# Patient Record
Sex: Female | Born: 1960
Health system: Southern US, Community
[De-identification: ages and names within clinical notes are randomized; demographics above are authoritative.]

## PROBLEM LIST (undated history)

## (undated) DIAGNOSIS — R002 Palpitations: Secondary | ICD-10-CM

## (undated) DIAGNOSIS — E669 Obesity, unspecified: Secondary | ICD-10-CM

## (undated) DIAGNOSIS — C539 Malignant neoplasm of cervix uteri, unspecified: Secondary | ICD-10-CM

## (undated) DIAGNOSIS — E079 Disorder of thyroid, unspecified: Secondary | ICD-10-CM

## (undated) DIAGNOSIS — F419 Anxiety disorder, unspecified: Secondary | ICD-10-CM

## (undated) DIAGNOSIS — Z923 Personal history of irradiation: Secondary | ICD-10-CM

## (undated) HISTORY — DX: Malignant neoplasm of cervix uteri, unspecified: C53.9

## (undated) HISTORY — DX: Obesity, unspecified: E66.9

## (undated) HISTORY — DX: Personal history of irradiation: Z92.3

## (undated) HISTORY — PX: THERAPEUTIC ABORTION: SHX798

---

## 2020-02-16 DIAGNOSIS — F4322 Adjustment disorder with anxiety: Secondary | ICD-10-CM | POA: Diagnosis not present

## 2020-02-16 DIAGNOSIS — Z6836 Body mass index (BMI) 36.0-36.9, adult: Secondary | ICD-10-CM | POA: Diagnosis not present

## 2020-02-16 DIAGNOSIS — N939 Abnormal uterine and vaginal bleeding, unspecified: Secondary | ICD-10-CM | POA: Diagnosis not present

## 2020-02-16 DIAGNOSIS — R319 Hematuria, unspecified: Secondary | ICD-10-CM | POA: Diagnosis not present

## 2020-03-08 DIAGNOSIS — Z1331 Encounter for screening for depression: Secondary | ICD-10-CM | POA: Diagnosis not present

## 2020-03-08 DIAGNOSIS — Z131 Encounter for screening for diabetes mellitus: Secondary | ICD-10-CM | POA: Diagnosis not present

## 2020-03-08 DIAGNOSIS — Z6836 Body mass index (BMI) 36.0-36.9, adult: Secondary | ICD-10-CM | POA: Diagnosis not present

## 2020-03-08 DIAGNOSIS — Z0000 Encounter for general adult medical examination without abnormal findings: Secondary | ICD-10-CM | POA: Diagnosis not present

## 2020-03-08 DIAGNOSIS — Z13220 Encounter for screening for lipoid disorders: Secondary | ICD-10-CM | POA: Diagnosis not present

## 2020-03-08 DIAGNOSIS — N950 Postmenopausal bleeding: Secondary | ICD-10-CM | POA: Diagnosis not present

## 2020-03-08 DIAGNOSIS — Z01419 Encounter for gynecological examination (general) (routine) without abnormal findings: Secondary | ICD-10-CM | POA: Diagnosis not present

## 2020-04-15 DIAGNOSIS — Z6835 Body mass index (BMI) 35.0-35.9, adult: Secondary | ICD-10-CM | POA: Diagnosis not present

## 2020-04-15 DIAGNOSIS — N950 Postmenopausal bleeding: Secondary | ICD-10-CM | POA: Diagnosis not present

## 2020-05-03 DIAGNOSIS — D061 Carcinoma in situ of exocervix: Secondary | ICD-10-CM | POA: Diagnosis not present

## 2020-05-03 DIAGNOSIS — R87613 High grade squamous intraepithelial lesion on cytologic smear of cervix (HGSIL): Secondary | ICD-10-CM | POA: Diagnosis not present

## 2020-05-03 DIAGNOSIS — D069 Carcinoma in situ of cervix, unspecified: Secondary | ICD-10-CM | POA: Diagnosis not present

## 2020-05-03 DIAGNOSIS — N950 Postmenopausal bleeding: Secondary | ICD-10-CM | POA: Diagnosis not present

## 2020-05-03 DIAGNOSIS — Z6835 Body mass index (BMI) 35.0-35.9, adult: Secondary | ICD-10-CM | POA: Diagnosis not present

## 2020-05-03 DIAGNOSIS — C539 Malignant neoplasm of cervix uteri, unspecified: Secondary | ICD-10-CM | POA: Diagnosis not present

## 2020-05-03 DIAGNOSIS — N871 Moderate cervical dysplasia: Secondary | ICD-10-CM | POA: Diagnosis not present

## 2020-05-11 ENCOUNTER — Encounter: Payer: Self-pay | Admitting: *Deleted

## 2020-05-11 ENCOUNTER — Telehealth: Payer: Self-pay | Admitting: *Deleted

## 2020-05-11 NOTE — Telephone Encounter (Signed)
Called the patient and scheduled an appt for 6/4 at 10:15am. Gave the address and phone number for the clinic. Also gave the policy for mask, parking and visitors

## 2020-05-12 NOTE — Progress Notes (Signed)
GYNECOLOGIC ONCOLOGY NEW PATIENT CONSULTATION   Patient Name: Kelly Hanson  Patient Age: 59 y.o. Date of Service: 05/14/20 Referring Provider: No referring provider defined for this encounter.   Primary Care Provider: [name], MD Consulting Provider: Jeral Pinch, MD   Assessment/Plan:  Clinical Stage IIB SCC of the cervix.  Discussed biopsy results and my exam findings today as well as treatment options in cervical cancer including surgery and radiation. Due to suspected parametrial involvement, the patient has at least Stage IIB disease. Given these findings on exam, she is not a candidate for primary surgical treatment. I reviewed the plan for primary radiation with sensitizing cisplatin. I ordered both a PET and CT today for radiologic staging.   We discussed the role that HPV plays in the pathogenesis of cervical cancer. I commended the patient on cessation of tobacco use.  The patient met with our nurse navigator, Santiago Glad, and will be scheduled to see Dr. Sondra Come and Dr. Alvy Bimler. We will present her case at tumor board for pathology (no LVSI details available on outside report) and radiology review.  All of the patient's questions were answered today.  A copy of this note was sent to the patient's referring provider.   55 minutes of total time was spent for this patient encounter, including preparation, face-to-face counseling with the patient and coordination of care, and documentation of the encounter.  Jeral Pinch, MD  Division of Gynecologic Oncology  Department of Obstetrics and Gynecology  [facility]  ___________________________________________  Chief Complaint: Chief Complaint  Patient presents with  . Cervical Cancer    New patient    History of Present Illness:  Kelly Hanson is a 59 y.o. y.o. female who is seen in consultation at the request of No ref. provider found for an evaluation of newly diagnosed cervical  cancer.  Between 6-8 months ago, the patient endorses having irregular spotting intermittnetly every few weeks that would last for a few days. This did not change over time and she describes it as light spotting. This was the first bleeding she had after menopause. She ultimately saw her PCP who performed a pap in March which returned showing HSIL. The patient tells me today that this is the first pap test she has had ever. She was then referred to gyn and underwent EMB/LEEP and transvaginal ultrasound for work-up of her PMB and high grade cervical dysplasia. Unfortunately, her pathology revealed SCC of the cervix.   Since her procedure on 5/24, she has had some bleeding and cramping, which is improving daily. She denies any vaginal discharge or odor. Appetite has been good, denies any nausea or emesis. She endorses normal bowel and bladder function. She has occasional pelvic pain when sitting.  She endorses a long history of back pain, no change recently. She denies any shortness of breath or chest pain at rest or with ambulation. Her history is notable for approximately 40 years of tobacco use - quit 1.5 years ago. She and her husband live in [place].  PAST MEDICAL HISTORY:  Past Medical History:  Diagnosis Date  . Cervical cancer ([place])   . Obesity (BMI 30-39.9)      PAST SURGICAL HISTORY:  History reviewed. No pertinent surgical history.  OB/GYN HISTORY:  OB History  Gravida Para Term Preterm AB Living  1 0          SAB TAB Ectopic Multiple Live Births               #  Outcome Date GA Lbr Len/2nd Weight Sex Delivery Anes PTL Lv  1 Gravida             No LMP recorded.  Age at menarche: 67 Age at menopause: 41 Hx of HRT: denies Hx of STDs: denies Last pap: 02/2020 - HSIL; first pap ever History of abnormal pap smears: yes  SCREENING STUDIES:  Last mammogram: has never had  Last colonoscopy: has never had  MEDICATIONS: Outpatient Encounter Medications as of 05/14/2020   Medication Sig  . Acetaminophen (TYLENOL PO) Take by mouth. PRN  . escitalopram (LEXAPRO) 5 MG tablet Take 5 mg by mouth at bedtime.  . IBUPROFEN PO Take by mouth. PRN  . [DISCONTINUED] escitalopram (LEXAPRO) 10 MG tablet Take 10 mg by mouth at bedtime.   No facility-administered encounter medications on file as of 05/14/2020.    ALLERGIES:  No Known Allergies   FAMILY HISTORY:  Family History  Problem Relation Age of Onset  . Stroke Maternal Grandmother   . Colon cancer Neg Hx   . Breast cancer Neg Hx   . Endometrial cancer Neg Hx   . Ovarian cancer Neg Hx      SOCIAL HISTORY:    Social Connections:   . Frequency of Communication with Friends and Family:   . Frequency of Social Gatherings with Friends and Family:   . Attends Religious Services:   . Active Member of Clubs or Organizations:   . Attends Archivist Meetings:   Marland Kitchen Marital Status:     REVIEW OF SYSTEMS:  + urinary frequency, incontinence, hot flashes, back pain, pelvic pain, vaginal bleeding and discharge, anxiety, depression, easy bruising. Denies appetite changes, fevers, chills, fatigue, unexplained weight changes. Denies hearing loss, neck lumps or masses, mouth sores, ringing in ears or voice changes. Denies cough or wheezing.  Denies shortness of breath. Denies chest pain or palpitations. Denies leg swelling. Denies abdominal distention, pain, blood in stools, constipation, diarrhea, nausea, vomiting, or early satiety. Denies pain with intercourse, dysuria, hematuria. Denies hot flashes, pelvic pain.   Denies joint pain or muscle pain/cramps. Denies itching, rash, or wounds. Denies dizziness, headaches, numbness or seizures. Denies swollen lymph nodes or glands. Denies confusion, or decreased concentration.  Physical Exam:  Vital Signs for this encounter:  Blood pressure (!) 161/77, pulse 81, temperature 98.9 F (37.2 C), temperature source Oral, resp. rate 18, height 5' 7"  (1.702 m),  weight 225 lb 12.8 oz (102.4 kg), SpO2 99 %. Body mass index is 35.37 kg/m. General: Alert, oriented, no acute distress.  HEENT: Normocephalic, atraumatic. Sclera anicteric.  Chest: Clear to auscultation bilaterally. No wheezes, rhonchi, or rales. Cardiovascular: Regular rate and rhythm, no murmurs, rubs, or gallops.  Abdomen: Obese. Normoactive bowel sounds. Soft, nondistended, nontender to palpation. No masses or hepatosplenomegaly appreciated. No palpable fluid wave.  Back: No CVA tenderness. Extremities: Grossly normal range of motion. Warm, well perfused. No edema bilaterally.  Skin: No rashes or lesions.  Lymphatics: No cervical, supraclavicular, or inguinal adenopathy.  GU:  Normal external female genitalia.   Minimal blood within the vaginal vault.  Well supported bladder.  Cervix is notable for evidence of recent LEEP procedure.  Eschar is still noted on the tissue itself.  The LEEP bed is friable and bleeds easily.  There is discoloration noted almost a centimeter around the LEEP diameter.  I do not see any evidence of vaginal involvement.  Some papular appearing tissue is noted within the LEEP bed.  On bimanual exam, the cervix  is firm, nodular and barrel-shaped, the tumor feels to be replacing most of the cervix.  On rectovaginal exam, there is thickening of the parametrial tissue bilaterally just adjacent to and behind the cervix.  This tissue is not nodular but is fairly thick.  The uterus itself is small and mobile, no adnexal masses appreciated.  LABORATORY AND RADIOLOGIC DATA:  Outside medical records were reviewed to synthesize the above history, along with the history and physical obtained during the visit.   Lab Results  Component Value Date   GLUCOSE 97 05/14/2020   ALT 31 05/14/2020   AST 20 05/14/2020   NA 140 05/14/2020   K 3.8 05/14/2020   CL 106 05/14/2020   CREATININE 0.87 05/14/2020   BUN 15 05/14/2020   CO2 23 05/14/2020   Pap on 03/09/19: HSIL  5/24: ECC  - CIN3 EMB - invasive SCC and CIN3 mied with scant benign endometrial tissue LEEP - invasive SCC, DOI >76m, width at least 1cm, deep and lateral margins are all positive.   Pelvic ultrasound on 5/24: Uterus 6x3x2.3cm, normal appearing adnexa. Endometrial lining is 0.942m

## 2020-05-14 ENCOUNTER — Other Ambulatory Visit: Payer: Self-pay

## 2020-05-14 ENCOUNTER — Telehealth: Payer: Self-pay | Admitting: Oncology

## 2020-05-14 ENCOUNTER — Encounter: Payer: Self-pay | Admitting: Oncology

## 2020-05-14 ENCOUNTER — Inpatient Hospital Stay (HOSPITAL_BASED_OUTPATIENT_CLINIC_OR_DEPARTMENT_OTHER): Payer: BC Managed Care – PPO | Admitting: Gynecologic Oncology

## 2020-05-14 ENCOUNTER — Other Ambulatory Visit: Payer: Self-pay | Admitting: Lab

## 2020-05-14 ENCOUNTER — Inpatient Hospital Stay: Payer: BC Managed Care – PPO | Attending: Gynecologic Oncology

## 2020-05-14 ENCOUNTER — Encounter: Payer: Self-pay | Admitting: Gynecologic Oncology

## 2020-05-14 VITALS — BP 161/77 | HR 81 | Temp 98.9°F | Resp 18 | Ht 67.0 in | Wt 225.8 lb

## 2020-05-14 DIAGNOSIS — Z87891 Personal history of nicotine dependence: Secondary | ICD-10-CM | POA: Insufficient documentation

## 2020-05-14 DIAGNOSIS — Z7982 Long term (current) use of aspirin: Secondary | ICD-10-CM | POA: Insufficient documentation

## 2020-05-14 DIAGNOSIS — E669 Obesity, unspecified: Secondary | ICD-10-CM | POA: Diagnosis not present

## 2020-05-14 DIAGNOSIS — C539 Malignant neoplasm of cervix uteri, unspecified: Secondary | ICD-10-CM

## 2020-05-14 DIAGNOSIS — Z6830 Body mass index (BMI) 30.0-30.9, adult: Secondary | ICD-10-CM | POA: Insufficient documentation

## 2020-05-14 DIAGNOSIS — Z79899 Other long term (current) drug therapy: Secondary | ICD-10-CM | POA: Insufficient documentation

## 2020-05-14 DIAGNOSIS — Z5111 Encounter for antineoplastic chemotherapy: Secondary | ICD-10-CM | POA: Insufficient documentation

## 2020-05-14 LAB — CMP (CANCER CENTER ONLY)
ALT: 31 U/L (ref 0–44)
AST: 20 U/L (ref 15–41)
Albumin: 4.2 g/dL (ref 3.5–5.0)
Alkaline Phosphatase: 73 U/L (ref 38–126)
Anion gap: 11 (ref 5–15)
BUN: 15 mg/dL (ref 6–20)
CO2: 23 mmol/L (ref 22–32)
Calcium: 9.4 mg/dL (ref 8.9–10.3)
Chloride: 106 mmol/L (ref 98–111)
Creatinine: 0.87 mg/dL (ref 0.44–1.00)
GFR, Est AFR Am: >60 mL/min (ref >60)
GFR, Estimated: >60 mL/min (ref >60)
Glucose, Bld: 97 mg/dL (ref 70–99)
Potassium: 3.8 mmol/L (ref 3.5–5.1)
Sodium: 140 mmol/L (ref 135–145)
Total Bilirubin: 0.7 mg/dL (ref 0.3–1.2)
Total Protein: 7.4 g/dL (ref 6.5–8.1)

## 2020-05-14 NOTE — Progress Notes (Signed)
Requested slides from 05/03/20 biopsy from [facility].  Faxed request to [phone].

## 2020-05-14 NOTE — Progress Notes (Signed)
Met with Kelly Hanson and her husband and explained my role as Art therapist.  Discussed the plan of care including chemotherapy and radiation.  Gave her the [employer] and encouraged her to call with any questions or needs.

## 2020-05-14 NOTE — Telephone Encounter (Signed)
Mal Amabile with appointment to see Dr. Alvy Bimler on 05/21/20 at 2 pm.  She verbalized understanding and agreement.

## 2020-05-14 NOTE — Patient Instructions (Signed)
It was a pleasure meeting you today.  I will call you after our tumor conference a week from Monday.  In the meantime, we will work on getting the scans done.  I am also asking our nurse navigator to work on getting you scheduled to see our radiation doctor and medical oncologist.

## 2020-05-15 ENCOUNTER — Encounter: Payer: Self-pay | Admitting: Gynecologic Oncology

## 2020-05-17 ENCOUNTER — Telehealth: Payer: Self-pay | Admitting: Oncology

## 2020-05-17 NOTE — Telephone Encounter (Signed)
[facility] and advised her of appointments for a nurse eval at 9:30 and consult with Dr. Sondra Come at 10:00 on 05/19/20. Advised her that she should receive a call to schedule CT SIM on 05/20/20.  She verbalized agreement. Also sent her an email with all her upcoming appointments for her records.

## 2020-05-18 ENCOUNTER — Encounter: Payer: Self-pay | Admitting: Oncology

## 2020-05-18 ENCOUNTER — Other Ambulatory Visit: Payer: Self-pay

## 2020-05-18 ENCOUNTER — Encounter (HOSPITAL_COMMUNITY): Payer: Self-pay

## 2020-05-18 ENCOUNTER — Ambulatory Visit (HOSPITAL_COMMUNITY)
Admission: RE | Admit: 2020-05-18 | Discharge: 2020-05-18 | Disposition: A | Discharge status: Home / Self Care | Payer: BC Managed Care – PPO | Source: Ambulatory Visit | Attending: Gynecologic Oncology | Admitting: Gynecologic Oncology

## 2020-05-18 DIAGNOSIS — R109 Unspecified abdominal pain: Secondary | ICD-10-CM | POA: Diagnosis not present

## 2020-05-18 DIAGNOSIS — C539 Malignant neoplasm of cervix uteri, unspecified: Secondary | ICD-10-CM | POA: Insufficient documentation

## 2020-05-18 DIAGNOSIS — R197 Diarrhea, unspecified: Secondary | ICD-10-CM | POA: Diagnosis not present

## 2020-05-18 MED ORDER — SODIUM CHLORIDE (PF) 0.9 % IJ SOLN
INTRAMUSCULAR | Status: AC
  Filled 2020-05-18: qty 50

## 2020-05-18 MED ORDER — IOHEXOL 300 MG/ML  SOLN
100.0000 mL | Freq: Once | INTRAMUSCULAR | Status: AC | PRN
  Administered 2020-05-18: 100 mL via INTRAVENOUS

## 2020-05-18 NOTE — Progress Notes (Signed)
Lorriane dropped off FMLA forms for herself and her husband and critical illness forms.  Forward them to [employer], Therapist, sports.

## 2020-05-18 NOTE — Progress Notes (Signed)
Patient here for a consult with Dr. Sondra Come.  GYNECOLOGIC ONCOLOGY NEW PATIENT CONSULTATION   Patient Name: Kelly Hanson  Patient Age: 59 y.o. Date of Service: 05/14/20 Referring Provider: No referring provider defined for this encounter.   Primary Care Provider: [name], MD Consulting Provider: Jeral Pinch, MD   Assessment/Plan:  Clinical Stage IIB SCC of the cervix.  Discussed biopsy results and my exam findings today as well as treatment options in cervical cancer including surgery and radiation. Due to suspected parametrial involvement, the patient has at least Stage IIB disease. Given these findings on exam, she is not a candidate for primary surgical treatment. I reviewed the plan for primary radiation with sensitizing cisplatin. I ordered both a PET and CT today for radiologic staging.   We discussed the role that HPV plays in the pathogenesis of cervical cancer. I commended the patient on cessation of tobacco use.  The patient met with our nurse navigator, Santiago Glad, and will be scheduled to see Dr. Sondra Come and Dr. Alvy Bimler. We will present her case at tumor board for pathology (no LVSI details available on outside report) and radiology review.  All of the patient's questions were answered today.  A copy of this note was sent to the patient's referring provider.   Chief Complaint:     Chief Complaint  Patient presents with  . Cervical Cancer    New patient    History of Present Illness:  Kelly Hanson is a 59 y.o. y.o. female who is seen in consultation at the request of No ref. provider found for an evaluation of newly diagnosed cervical cancer.  Between 6-8 months ago, the patient endorses having irregular spotting intermittnetly every few weeks that would last for a few days. This did not change over time and she describes it as light spotting. This was the first bleeding she had after menopause. She ultimately saw her PCP who performed a pap in March which  returned showing HSIL. The patient tells me today that this is the first pap test she has had ever. She was then referred to gyn and underwent EMB/LEEP and transvaginal ultrasound for work-up of her PMB and high grade cervical dysplasia. Unfortunately, her pathology revealed SCC of the cervix.   Since her procedure on 5/24, she has had some bleeding and cramping, which is improving daily. She denies any vaginal discharge or odor. Appetite has been good, denies any nausea or emesis. She endorses normal bowel and bladder function. She has occasional pelvic pain when sitting.  She endorses a long history of back pain, no change recently. She denies any shortness of breath or chest pain at rest or with ambulation. Her history is notable for approximately 40 years of tobacco use - quit 1.5 years ago. She and her husband live in [place].    Past/Anticipated interventions by medical oncology, if any: possibly chemo 1x per week  Weight changes, if any: a few pounds from dieting  Bowel/Bladder complaints, if any: has loose stools ? From Lexapro  Nausea/Vomiting, if any: No  Pain issues, if any:  Pelvic discomfort  SAFETY ISSUES:  Prior radiation? no  Pacemaker/ICD? no  Possible current pregnancy? Postmenopausal  Is the patient on methotrexate? no  Current Complaints / other details: has light bleeding currently when wiping  BP (!) 157/90 (BP Location: Left Arm, Patient Position: Sitting, Cuff Size: Large)   Pulse 80   Temp 97.9 F (36.6 C)   Resp 20   Ht 5' 7"  (1.702 m)  Wt 221 lb 6.4 oz (100.4 kg)   SpO2 100%   BMI 34.68 kg/m   Wt Readings from Last 3 Encounters:  05/19/20 221 lb 6.4 oz (100.4 kg)  05/14/20 225 lb 12.8 oz (102.4 kg)

## 2020-05-18 NOTE — Progress Notes (Signed)
Radiation Oncology         [phone] ________________________________  Initial Outpatient Consultation  Name: Kelly Hanson MRN: [id number]  Date: 05/19/2020  DOB: Feb 23, 1961  GD:[id number], Anderson Malta, MD  [name], MD   REFERRING PHYSICIAN: [name], MD  DIAGNOSIS: The encounter diagnosis was Malignant neoplasm of cervix, unspecified site [facility]).   Clinical stage IIB squamous cell carcinoma of the cervix  HISTORY OF PRESENT ILLNESS::Kelly Hanson is a 59 y.o. female who is seen as a courtesy of Dr Berline Lopes for an opinion concerning radiation therapy as part of management of the patient's recently diagnosed locally advanced squamous cell carcinoma of the cervix. The patient presented to her PCP in March of 2021 for evaluation of 6-8 month history of intermittent postmenopausal vaginal bleeding. A PAP smear was performed at that time, which showed a high-grade squamous intraepithelial lesion. She was then referred to a gynecologist and underwent an EMB/LEEP on 05/03/2020 and a transvaginal ultrasound for work-up of her postmenopausal bleeding and high-grade cervical dysplasia. Pathology revealed squamous cell carcinoma of the cervix.  The patient was referred to Dr. Berline Lopes and was seen in consultation on 05/14/2020. At that time, they discussed treatment options including surgery and radiation therapy. Given that the tumor was noted to replace most of the cervix and there was thickening of the parametrial tissue bilaterally just adjacent to and behind the cervix, the patient was noted to not be a candidate for primary surgical treatment. Thus, they discussed the plan for PET/CT scan followed by primary radiation with sensitizing Cisplatin.  CT scan of abdomen/pelvis on 05/18/2020 showed a small amount of gas either along the cervical canal or the adjacent fornix. A well-defined mass was not seen. There was also noted to be chronic bilateral pars defects at L5 with 7 mm grade 1  anterolisthesis of L5 on S1 and resulting mild left foraminal stenosis. Additionally, there was a suspected right foraminal disc protrusion at L3-4 that was possibly causing mild right foraminal impingement. Fortunately, there were no findings of metastatic disease to the abdomen or pelvis.  PREVIOUS RADIATION THERAPY: No  PAST MEDICAL HISTORY:  Past Medical History:  Diagnosis Date  . Cervical cancer ([place])   . Obesity (BMI 30-39.9)     PAST SURGICAL HISTORY:History reviewed. No pertinent surgical history.  FAMILY HISTORY:  Family History  Problem Relation Age of Onset  . Stroke Maternal Grandmother   . Colon cancer Neg Hx   . Breast cancer Neg Hx   . Endometrial cancer Neg Hx   . Ovarian cancer Neg Hx     SOCIAL HISTORY: She lives in [place] a farm.  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 Smoking status: Former Research scientist (life sciences)  . Smokeless tobacco: Never Used  Substance Use Topics  . Alcohol use: Not Currently  . Drug use: Never    ALLERGIES: No Known Allergies  MEDICATIONS:  Current Outpatient Medications  Medication Sig Dispense Refill  . Acetaminophen (TYLENOL PO) Take by mouth. PRN    . aspirin EC 81 MG tablet Take 81 mg by mouth every 4 (four) hours as needed.    Marland Kitchen escitalopram (LEXAPRO) 5 MG tablet Take 5 mg by mouth at bedtime.    . IBUPROFEN PO Take by mouth. PRN     No current facility-administered medications for this encounter.    REVIEW OF SYSTEMS:  A 10+ POINT REVIEW OF SYSTEMS WAS OBTAINED including neurology, dermatology, psychiatry, cardiac, respiratory, lymph, extremities, GI,  GU, musculoskeletal, constitutional, reproductive, HEENT.  Patient reports chronic low back pain.  She also has noticed some mild pelvic pain.  She denies any hematuria or rectal bleeding.  Appetite is good.  Energy level is good   PHYSICAL EXAM:  height is 5\' 7"  (1.702 m) and weight is 221 lb 6.4 oz (100.4 kg). Her temperature is 97.9 F (36.6 C). Her  blood pressure is 157/90 (abnormal) and her pulse is 80. Her respiration is 20 and oxygen saturation is 100%.   Body mass index is 34.68 kg/m. General: Alert and oriented, in no acute distress HEENT: Head is normocephalic. Extraocular movements are intact. Neck: Neck is supple, no palpable cervical or supraclavicular lymphadenopathy. Heart: Regular in rate and rhythm with no murmurs, rubs, or gallops. Chest: Clear to auscultation bilaterally, with no rhonchi, wheezes, or rales. Abdomen: Soft, nontender, nondistended, with no rigidity or guarding. Extremities: No cyanosis or edema. Lymphatics: see Neck Exam Skin: No concerning lesions. Musculoskeletal: symmetric strength and muscle tone throughout. Neurologic: Cranial nerves II through XII are grossly intact. No obvious focalities. Speech is fluent. Coordination is intact. Psychiatric: Judgment and insight are intact. Affect is appropriate. On pelvic examination the external genitalia were unremarkable. A speculum exam was performed. There are no mucosal lesions noted in the vaginal vault.  View of the cervix shows surgical changes consistent with LEEP procedure.  This area bleeds easily with manipulation.  On bimanual and rectovaginal examination the cervix is expanded and firm consistent with tumor infiltration, somewhat of a barrel-shaped.  Difficult to determine parametrial involvement on exam today.   ECOG =  1 0 - Asymptomatic (Fully active, able to carry on all predisease activities without restriction)  1 - Symptomatic but completely ambulatory (Restricted in physically strenuous activity but ambulatory and able to carry out work of a light or sedentary nature. For example, light housework, office work)  2 - Symptomatic, <50% in bed during the day (Ambulatory and capable of all self care but unable to carry out any work activities. Up and about more than 50% of waking hours)  3 - Symptomatic, >50% in bed, but not bedbound (Capable of  only limited self-care, confined to bed or chair 50% or more of waking hours)  4 - Bedbound (Completely disabled. Cannot carry on any self-care. Totally confined to bed or chair)  5 - Death   Eustace Pen MM, Creech RH, Tormey DC, et al. [phone]). "Toxicity and response criteria of the [facility]". [place] Oncol. 5 (6): 649-55  LABORATORY DATA:  No results found for: WBC, HGB, HCT, MCV, PLT, NEUTROABS Lab Results  Component Value Date   NA 140 05/14/2020   K 3.8 05/14/2020   CL 106 05/14/2020   CO2 23 05/14/2020   GLUCOSE 97 05/14/2020   CREATININE 0.87 05/14/2020   CALCIUM 9.4 05/14/2020      RADIOGRAPHY: CT Abdomen Pelvis W Contrast  Result Date: 05/18/2020 CLINICAL DATA:  Staging of cervical cancer. Lower abdominal pain and discomfort for 6 months with diarrhea and occasional vaginal bleeding. EXAM: CT ABDOMEN AND PELVIS WITH CONTRAST TECHNIQUE: Multidetector CT imaging of the abdomen and pelvis was performed using the standard protocol following bolus administration of intravenous contrast. CONTRAST:  130mL OMNIPAQUE IOHEXOL 300 MG/ML  SOLN COMPARISON:  None. FINDINGS: Lower chest: 0.5 by 0.4 cm right lower lobe pulmonary nodule on image 17/6. Hepatobiliary: Unremarkable Pancreas: Unremarkable Spleen: Unremarkable Adrenals/Urinary Tract: Mild scarring in the left kidney upper pole. Adrenal glands unremarkable. Urinary bladder appears  normal. Stomach/Bowel: Unremarkable Vascular/Lymphatic: No pathologic adenopathy. Minimal atherosclerotic calcification in the left common iliac artery. Reproductive: There is a small amount of gas either along the cervical canal or the adjacent fornix on image 71/5. A well-defined mass is not seen. No widening of the endometrial stripe. The ovaries and adnexa appear normal. Other: No supplemental non-categorized findings. Musculoskeletal: Chronic bilateral pars defects at L5 with 7 mm grade 1 anterolisthesis of L5 on S1 and resulting mild  left foraminal stenosis. Suspected right foraminal disc protrusion at L3-4 possibly causing mild right foraminal impingement. IMPRESSION: 1. No findings of metastatic disease to the abdomen/pelvis. 2. There is a small amount of gas either along the cervical canal or the adjacent fornix. A well-defined mass is not seen. 3. Chronic bilateral pars defects at L5 with 7 mm grade 1 anterolisthesis of L5 on S1 and resulting mild left foraminal stenosis. 4. Suspected right foraminal disc protrusion at L3-4 possibly causing mild right foraminal impingement. Electronically Signed   By: Van Clines M.D.   On: 05/18/2020 16:47      IMPRESSION: Clinical stage IIB squamous cell carcinoma of the cervix  The patient will be a good candidate for definitive course of radiation therapy along with radiosensitizing chemotherapy.  Radiation therapy would entail external beam radiation therapy directed at the pelvis area followed by bilateral parametrial boost.  In addition radiation therapy would include 5 high-dose-rate intracavitary brachytherapy treatments directed at the cervical area.  Treatment plans could change depending on the patient's upcoming PET scan June 16 but on recent CT scan of the abdomen and pelvis there was no obvious metastatic spread outside of the cervical area.  Today, I talked to the patient and  about the findings and work-up thus far.  We discussed the natural history of cervical cancer and general treatment, highlighting the role of radiotherapy (external beam and intracavitary high-dose-rate brachytherapy treatments) in the management.  We discussed the available radiation techniques, and focused on the details of logistics and delivery.  We reviewed the anticipated acute and late sequelae associated with radiation in this setting.  The patient was encouraged to ask questions that I answered to the best of my ability.  A patient consent form was discussed and signed.  We retained a copy for  our records.  The patient would like to proceed with radiation and will be scheduled for CT simulation.  PLAN: The patient is scheduled to see Dr. Alvy Bimler on 05/21/2020. She is also scheduled for a PET scan on 05/26/2020.  She will proceed with CT simulation later today.  She will be ready to begin radiation therapy on June 21 concomitant with radiosensitizing chemotherapy.  Anticipate 6 weeks of external beam radiation therapy followed by 5 intracavitary brachytherapy treatments.    ------------------------------------------------  Blair Promise, PhD, MD  This document serves as a record of services personally performed by Gery Pray, MD. It was created on his behalf by Clerance Lav, a trained medical scribe. The creation of this record is based on the scribe's personal observations and the provider's statements to them. This document has been checked and approved by the attending provider.

## 2020-05-19 ENCOUNTER — Ambulatory Visit
Admission: RE | Admit: 2020-05-19 | Discharge: 2020-05-19 | Disposition: A | Discharge status: Home / Self Care | Payer: BC Managed Care – PPO | Source: Ambulatory Visit | Attending: Radiation Oncology | Admitting: Radiation Oncology

## 2020-05-19 ENCOUNTER — Encounter: Payer: Self-pay | Admitting: Radiation Oncology

## 2020-05-19 ENCOUNTER — Other Ambulatory Visit: Payer: Self-pay

## 2020-05-19 DIAGNOSIS — Z9889 Other specified postprocedural states: Secondary | ICD-10-CM | POA: Diagnosis not present

## 2020-05-19 DIAGNOSIS — Z7982 Long term (current) use of aspirin: Secondary | ICD-10-CM | POA: Insufficient documentation

## 2020-05-19 DIAGNOSIS — C539 Malignant neoplasm of cervix uteri, unspecified: Secondary | ICD-10-CM | POA: Insufficient documentation

## 2020-05-19 DIAGNOSIS — Z79899 Other long term (current) drug therapy: Secondary | ICD-10-CM | POA: Insufficient documentation

## 2020-05-19 DIAGNOSIS — Z87891 Personal history of nicotine dependence: Secondary | ICD-10-CM | POA: Insufficient documentation

## 2020-05-19 DIAGNOSIS — Z510 Encounter for antineoplastic radiation therapy: Secondary | ICD-10-CM | POA: Diagnosis not present

## 2020-05-21 ENCOUNTER — Inpatient Hospital Stay: Payer: BC Managed Care – PPO | Admitting: Hematology and Oncology

## 2020-05-21 ENCOUNTER — Other Ambulatory Visit: Payer: Self-pay

## 2020-05-21 ENCOUNTER — Encounter: Payer: Self-pay | Admitting: Hematology and Oncology

## 2020-05-21 ENCOUNTER — Inpatient Hospital Stay: Payer: BC Managed Care – PPO

## 2020-05-21 VITALS — BP 150/89 | HR 81 | Temp 98.3°F | Resp 18 | Ht 67.0 in | Wt 218.6 lb

## 2020-05-21 DIAGNOSIS — Z87891 Personal history of nicotine dependence: Secondary | ICD-10-CM

## 2020-05-21 DIAGNOSIS — E669 Obesity, unspecified: Secondary | ICD-10-CM

## 2020-05-21 DIAGNOSIS — C539 Malignant neoplasm of cervix uteri, unspecified: Secondary | ICD-10-CM | POA: Diagnosis not present

## 2020-05-21 DIAGNOSIS — Z7982 Long term (current) use of aspirin: Secondary | ICD-10-CM

## 2020-05-21 DIAGNOSIS — Z6830 Body mass index (BMI) 30.0-30.9, adult: Secondary | ICD-10-CM

## 2020-05-21 DIAGNOSIS — Z5111 Encounter for antineoplastic chemotherapy: Secondary | ICD-10-CM | POA: Diagnosis not present

## 2020-05-21 DIAGNOSIS — Z79899 Other long term (current) drug therapy: Secondary | ICD-10-CM

## 2020-05-21 MED ORDER — PROCHLORPERAZINE MALEATE 10 MG PO TABS: 10.0000 mg | ORAL_TABLET | Freq: Four times a day (QID) | ORAL | 1 refills | Status: DC | PRN

## 2020-05-21 MED ORDER — LIDOCAINE-PRILOCAINE 2.5-2.5 % EX CREA: TOPICAL_CREAM | CUTANEOUS | 3 refills | Status: DC

## 2020-05-21 MED ORDER — ONDANSETRON HCL 8 MG PO TABS: 8.0000 mg | ORAL_TABLET | Freq: Three times a day (TID) | ORAL | 1 refills | Status: DC | PRN

## 2020-05-21 NOTE — Progress Notes (Signed)
[place] CONSULT NOTE  Patient Care Team: [name], MD as PCP - General (Family Medicine)  ASSESSMENT & PLAN:  Cervical cancer [facility]) The final staging depends on PET CT scan next week Assuming she has stage IIb disease, I recommend concurrent chemoradiation therapy with curative intent  We discussed the role of chemotherapy. The intent is of curative intent.  We discussed some of the risks, benefits, side-effects of cisplatin and its role as chemo sensitizing agent. The plan for weekly cisplatin for x5-6 doses along with radiation treatment.  Some of the short term side-effects included, though not limited to, including weight loss, life threatening infections, risk of allergic reactions, need for transfusions of blood products, nausea, vomiting, change in bowel habits, loss of hair, admission to hospital for various reasons, and risks of death.   Long term side-effects are also discussed including risks of infertility, permanent damage to nerve function, hearing loss, chronic fatigue, kidney damage with possibility needing hemodialysis, and rare secondary malignancy including bone marrow disorders.  The patient is aware that the response rates discussed earlier is not guaranteed.  After a long discussion, patient made an informed decision to proceed with the prescribed plan of care.   Patient education material was dispensed. I recommend port placement, chemo education class and repeat blood work next week before we start her on treatment I will see her weekly with labs and exam for toxicity review     Orders Placed This Encounter  Procedures  . IR IMAGING GUIDED PORT INSERTION    Standing Status:   Future    Standing Expiration Date:   05/21/2021    Order Specific Question:   Reason for Exam (SYMPTOM  OR DIAGNOSIS REQUIRED)    Answer:   need port for chemo to start ASAP    Order Specific Question:   Is the patient pregnant?    Answer:   No    Order  Specific Question:   Preferred Imaging Location?    Answer:   [facility]  . CBC with Differential ([place] Only)    Standing Status:   Standing    Number of Occurrences:   20    Standing Expiration Date:   05/21/2021  . Basic Metabolic Panel - [place] Only    Standing Status:   Standing    Number of Occurrences:   20    Standing Expiration Date:   05/21/2021    The total time spent in the appointment was 70 minutes encounter with patients including review of chart and various tests results, discussions about plan of care and coordination of care plan   All questions were answered. The patient knows to call the clinic with any problems, questions or concerns. No barriers to learning was detected.  Heath Lark, MD 6/11/20214:07 PM  CHIEF COMPLAINTS/PURPOSE OF CONSULTATION:  Cervical cancer, for concurrent chemoradiation therapy  HISTORY OF PRESENTING ILLNESS:  Kelly Hanson 59 y.o. female is here because of recent diagnosis of cervical cancer She is here accompanied by her husband Her symptoms started with intermittent abnormal vaginal bleeding She denies pain  I have reviewed her chart and materials related to her cancer extensively and collaborated history with the patient. Summary of oncologic history is as follows: Oncology History  Cervical cancer ([place])  02/09/2020 Initial Diagnosis   Between 6-8 months ago, the patient endorses having irregular spotting intermittnetly every few weeks that would last for a few days. This did not change over time and she  describes it as light spotting. This was the first bleeding she had after menopause. She ultimately saw her PCP who performed a pap in March which returned showing HSIL. The patient tells me today that this is the first pap test she has had ever. She was then referred to gyn and underwent EMB/LEEP and transvaginal ultrasound for work-up of her PMB and high grade cervical dysplasia. Unfortunately, her pathology revealed  SCC of the cervix   05/03/2020 Pathology Results   Endocervical curettings show fragments of squamous epithelium with high-grade squamous intraepithelial lesion, CIN-3 as well as fragments of invasive squamous cell carcinoma within the endometrial biopsy.  The invasive squamous cell carcinoma have depth of invasion more than 3 mm, with at least 10 mm, thickness almost 1.2 mm.   05/18/2020 Imaging   1. No findings of metastatic disease to the abdomen/pelvis. 2. There is a small amount of gas either along the cervical canal or the adjacent fornix. A well-defined mass is not seen. 3. Chronic bilateral pars defects at L5 with 7 mm grade 1 anterolisthesis of L5 on S1 and resulting mild left foraminal stenosis. 4. Suspected right foraminal disc protrusion at L3-4 possibly causing mild right foraminal impingement.     05/21/2020 Cancer Staging   Staging form: Cervix Uteri, AJCC Version 9 - Clinical stage from 05/21/2020: FIGO Stage IIB (cT2b, cM0) - Signed by Heath Lark, MD on 05/21/2020   05/31/2020 -  Chemotherapy   The patient had palonosetron (ALOXI) injection 0.25 mg, 0.25 mg, Intravenous,  Once, 0 of 6 cycles CISplatin (PLATINOL) 76 mg in sodium chloride 0.9 % 250 mL chemo infusion, 40 mg/m2 = 76 mg, Intravenous,  Once, 0 of 6 cycles fosaprepitant (EMEND) 150 mg in sodium chloride 0.9 % 145 mL IVPB, 150 mg, Intravenous,  Once, 0 of 6 cycles  for chemotherapy treatment.      MEDICAL HISTORY:  Past Medical History:  Diagnosis Date  . Cervical cancer ([place])   . Obesity (BMI 30-39.9)     SURGICAL HISTORY: History reviewed. No pertinent surgical history.  SOCIAL HISTORY: Social History   Socioeconomic History  . Marital status: Married    Spouse name: Dominica Severin  . Number of children: 0  . Years of education: Not on file  . Highest education level: Not on file  Occupational History  . Occupation: Optometrist  Tobacco Use  . Smoking status: Former Research scientist (life sciences)  . Smokeless tobacco: Never Used   Vaping Use  . Vaping Use: Never used  Substance and Sexual Activity  . Alcohol use: Not Currently  . Drug use: Never  . Sexual activity: Not on file  Other Topics Concern  . Not on file  Social History Narrative  . Not on file   Social Determinants of Health   Financial Resource Strain:   . Difficulty of Paying Living Expenses:   Food Insecurity:   . Worried About Charity fundraiser in the Last Year:   . Arboriculturist in the Last Year:   Transportation Needs:   . Film/video editor (Medical):   Marland Kitchen Lack of Transportation (Non-Medical):   Physical Activity:   . Days of Exercise per Week:   . Minutes of Exercise per Session:   Stress:   . Feeling of Stress :   Social Connections:   . Frequency of Communication with Friends and Family:   . Frequency of Social Gatherings with Friends and Family:   . Attends Religious Services:   . Active Member of  Clubs or Organizations:   . Attends Archivist Meetings:   Marland Kitchen Marital Status:   Intimate Partner Violence:   . Fear of Current or Ex-Partner:   . Emotionally Abused:   Marland Kitchen Physically Abused:   . Sexually Abused:     FAMILY HISTORY: Family History  Problem Relation Age of Onset  . Stroke Maternal Grandmother   . Colon cancer Neg Hx   . Breast cancer Neg Hx   . Endometrial cancer Neg Hx   . Ovarian cancer Neg Hx     ALLERGIES:  has No Known Allergies.  MEDICATIONS:  Current Outpatient Medications  Medication Sig Dispense Refill  . Acetaminophen (TYLENOL PO) Take by mouth. PRN    . aspirin EC 81 MG tablet Take 81 mg by mouth every 4 (four) hours as needed.    Marland Kitchen escitalopram (LEXAPRO) 5 MG tablet Take 5 mg by mouth at bedtime.    . lidocaine-prilocaine (EMLA) cream Apply to affected area once 30 g 3  . ondansetron (ZOFRAN) 8 MG tablet Take 1 tablet (8 mg total) by mouth every 8 (eight) hours as needed. 30 tablet 1  . prochlorperazine (COMPAZINE) 10 MG tablet Take 1 tablet (10 mg total) by mouth every 6 (six)  hours as needed (Nausea or vomiting). 30 tablet 1   No current facility-administered medications for this visit.    REVIEW OF SYSTEMS:   Constitutional: Denies fevers, chills or abnormal night sweats Eyes: Denies blurriness of vision, double vision or watery eyes Ears, nose, mouth, throat, and face: Denies mucositis or sore throat Respiratory: Denies cough, dyspnea or wheezes Cardiovascular: Denies palpitation, chest discomfort or lower extremity swelling Gastrointestinal:  Denies nausea, heartburn or change in bowel habits Skin: Denies abnormal skin rashes Lymphatics: Denies new lymphadenopathy or easy bruising Neurological:Denies numbness, tingling or new weaknesses Behavioral/Psych: Mood is stable, no new changes  All other systems were reviewed with the patient and are negative.  PHYSICAL EXAMINATION: ECOG PERFORMANCE STATUS: 0 - Asymptomatic  Vitals:   05/21/20 1354  BP: (!) 150/89  Pulse: 81  Resp: 18  Temp: 98.3 F (36.8 C)  SpO2: 95%   Filed Weights   05/21/20 1354  Weight: 218 lb 9.6 oz (99.2 kg)    GENERAL:alert, no distress and comfortable SKIN: skin color, texture, turgor are normal, no rashes or significant lesions EYES: normal, conjunctiva are pink and non-injected, sclera clear OROPHARYNX:no exudate, no erythema and lips, buccal mucosa, and tongue normal  NECK: supple, thyroid normal size, non-tender, without nodularity LYMPH:  no palpable lymphadenopathy in the cervical, axillary or inguinal LUNGS: clear to auscultation and percussion with normal breathing effort HEART: regular rate & rhythm and no murmurs and no lower extremity edema ABDOMEN:abdomen soft, non-tender and normal bowel sounds Musculoskeletal:no cyanosis of digits and no clubbing  PSYCH: alert & oriented x 3 with fluent speech NEURO: no focal motor/sensory deficits  LABORATORY DATA:  I have reviewed the data as listed No results found for: WBC, HGB, HCT, MCV, PLT Recent Labs     05/14/20 1211  NA 140  K 3.8  CL 106  CO2 23  GLUCOSE 97  BUN 15  CREATININE 0.87  CALCIUM 9.4  GFRNONAA >60  GFRAA >60  PROT 7.4  ALBUMIN 4.2  AST 20  ALT 31  ALKPHOS 73  BILITOT 0.7    RADIOGRAPHIC STUDIES: I have personally reviewed the radiological images as listed and agreed with the findings in the report. CT Abdomen Pelvis W Contrast  Result Date:  05/18/2020 CLINICAL DATA:  Staging of cervical cancer. Lower abdominal pain and discomfort for 6 months with diarrhea and occasional vaginal bleeding. EXAM: CT ABDOMEN AND PELVIS WITH CONTRAST TECHNIQUE: Multidetector CT imaging of the abdomen and pelvis was performed using the standard protocol following bolus administration of intravenous contrast. CONTRAST:  141mL OMNIPAQUE IOHEXOL 300 MG/ML  SOLN COMPARISON:  None. FINDINGS: Lower chest: 0.5 by 0.4 cm right lower lobe pulmonary nodule on image 17/6. Hepatobiliary: Unremarkable Pancreas: Unremarkable Spleen: Unremarkable Adrenals/Urinary Tract: Mild scarring in the left kidney upper pole. Adrenal glands unremarkable. Urinary bladder appears normal. Stomach/Bowel: Unremarkable Vascular/Lymphatic: No pathologic adenopathy. Minimal atherosclerotic calcification in the left common iliac artery. Reproductive: There is a small amount of gas either along the cervical canal or the adjacent fornix on image 71/5. A well-defined mass is not seen. No widening of the endometrial stripe. The ovaries and adnexa appear normal. Other: No supplemental non-categorized findings. Musculoskeletal: Chronic bilateral pars defects at L5 with 7 mm grade 1 anterolisthesis of L5 on S1 and resulting mild left foraminal stenosis. Suspected right foraminal disc protrusion at L3-4 possibly causing mild right foraminal impingement. IMPRESSION: 1. No findings of metastatic disease to the abdomen/pelvis. 2. There is a small amount of gas either along the cervical canal or the adjacent fornix. A well-defined mass is not seen.  3. Chronic bilateral pars defects at L5 with 7 mm grade 1 anterolisthesis of L5 on S1 and resulting mild left foraminal stenosis. 4. Suspected right foraminal disc protrusion at L3-4 possibly causing mild right foraminal impingement. Electronically Signed   By: Van Clines M.D.   On: 05/18/2020 16:47

## 2020-05-21 NOTE — Progress Notes (Signed)
START OFF PATHWAY REGIMEN - Other   [id number]:Cisplatin 40 mg/m2 IV D1 q7 Days + RT:   A cycle is every 7 days:     Cisplatin   **Always confirm dose/schedule in your pharmacy ordering system**  Patient Characteristics: Intent of Therapy: Curative Intent, Discussed with Patient 

## 2020-05-21 NOTE — Assessment & Plan Note (Signed)
The final staging depends on PET CT scan next week Assuming she has stage IIb disease, I recommend concurrent chemoradiation therapy with curative intent  We discussed the role of chemotherapy. The intent is of curative intent.  We discussed some of the risks, benefits, side-effects of cisplatin and its role as chemo sensitizing agent. The plan for weekly cisplatin for x5-6 doses along with radiation treatment.  Some of the short term side-effects included, though not limited to, including weight loss, life threatening infections, risk of allergic reactions, need for transfusions of blood products, nausea, vomiting, change in bowel habits, loss of hair, admission to hospital for various reasons, and risks of death.   Long term side-effects are also discussed including risks of infertility, permanent damage to nerve function, hearing loss, chronic fatigue, kidney damage with possibility needing hemodialysis, and rare secondary malignancy including bone marrow disorders.  The patient is aware that the response rates discussed earlier is not guaranteed.  After a long discussion, patient made an informed decision to proceed with the prescribed plan of care.   Patient education material was dispensed. I recommend port placement, chemo education class and repeat blood work next week before we start her on treatment I will see her weekly with labs and exam for toxicity review

## 2020-05-24 ENCOUNTER — Telehealth: Payer: Self-pay | Admitting: Gynecologic Oncology

## 2020-05-24 ENCOUNTER — Telehealth: Payer: Self-pay | Admitting: *Deleted

## 2020-05-24 NOTE — Telephone Encounter (Signed)
Per Dr Berline Lopes, scheduled an appt after her PET scan for 6/16

## 2020-05-24 NOTE — Telephone Encounter (Signed)
Called the patient to discuss plan to repeat an exam on 6/16 after she has her PET performed. Given CT results, I would like to make sure that what I felt as parametrial involvement was not just inflammation in the setting of very recent LEEP. Patient verbalized understanding.  Jeral Pinch MD Gynecologic Oncology

## 2020-05-25 NOTE — Progress Notes (Signed)
Pharmacist Chemotherapy Monitoring - Initial Assessment    Anticipated start date: 05/31/20   Regimen:  . Are orders appropriate based on the patient's diagnosis, regimen, and cycle? Yes . Does the plan date match the patient's scheduled date? Yes . Is the sequencing of drugs appropriate? Yes . Are the premedications appropriate for the patient's regimen? Yes . Prior Authorization for treatment is: Not Started o If applicable, is the correct biosimilar selected based on the patient's insurance? not applicable  Organ Function and Labs: Marland Kitchen Are dose adjustments needed based on the patient's renal function, hepatic function, or hematologic function? Yes . Are appropriate labs ordered prior to the start of patient's treatment? Yes . Other organ system assessment, if indicated: N/A . The following baseline labs, if indicated, have been ordered: N/A  Dose Assessment: . Are the drug doses appropriate? Yes . Are the following correct: o Drug concentrations Yes o IV fluid compatible with drug Yes o Administration routes Yes o Timing of therapy Yes . If applicable, does the patient have documented access for treatment and/or plans for port-a-cath placement? yes . If applicable, have lifetime cumulative doses been properly documented and assessed? yes Lifetime Dose Tracking  No doses have been documented on this patient for the following tracked chemicals: Doxorubicin, Epirubicin, Idarubicin, Daunorubicin, Mitoxantrone, Bleomycin, Oxaliplatin, Carboplatin, Liposomal Doxorubicin  o   Toxicity Monitoring/Prevention: . The patient has the following take home antiemetics prescribed: Ondansetron and Prochlorperazine . The patient has the following take home medications prescribed: N/A . Medication allergies and previous infusion related reactions, if applicable, have been reviewed and addressed. Yes . The patient's current medication list has been assessed for drug-drug interactions with their  chemotherapy regimen. no significant drug-drug interactions were identified on review.  Order Review: . Are the treatment plan orders signed? Yes . Is the patient scheduled to see a provider prior to their treatment? No  I verify that I have reviewed each item in the above checklist and answered each question accordingly.   Kennith Center, Pharm.D., CPP 05/25/2020@2 :52 PM

## 2020-05-26 ENCOUNTER — Other Ambulatory Visit: Payer: Self-pay

## 2020-05-26 ENCOUNTER — Inpatient Hospital Stay (HOSPITAL_BASED_OUTPATIENT_CLINIC_OR_DEPARTMENT_OTHER): Payer: BC Managed Care – PPO | Admitting: Gynecologic Oncology

## 2020-05-26 ENCOUNTER — Ambulatory Visit (HOSPITAL_COMMUNITY)
Admission: RE | Admit: 2020-05-26 | Discharge: 2020-05-26 | Disposition: A | Discharge status: Home / Self Care | Payer: BC Managed Care – PPO | Source: Ambulatory Visit | Attending: Gynecologic Oncology | Admitting: Gynecologic Oncology

## 2020-05-26 ENCOUNTER — Telehealth: Payer: Self-pay | Admitting: Oncology

## 2020-05-26 ENCOUNTER — Other Ambulatory Visit (HOSPITAL_COMMUNITY): Payer: BC Managed Care – PPO

## 2020-05-26 ENCOUNTER — Encounter: Payer: Self-pay | Admitting: Gynecologic Oncology

## 2020-05-26 VITALS — BP 149/88 | HR 64 | Temp 98.6°F | Resp 18 | Ht 67.0 in | Wt 219.4 lb

## 2020-05-26 DIAGNOSIS — Z6830 Body mass index (BMI) 30.0-30.9, adult: Secondary | ICD-10-CM

## 2020-05-26 DIAGNOSIS — E669 Obesity, unspecified: Secondary | ICD-10-CM

## 2020-05-26 DIAGNOSIS — Z7982 Long term (current) use of aspirin: Secondary | ICD-10-CM | POA: Diagnosis not present

## 2020-05-26 DIAGNOSIS — Z79899 Other long term (current) drug therapy: Secondary | ICD-10-CM | POA: Diagnosis not present

## 2020-05-26 DIAGNOSIS — Z87891 Personal history of nicotine dependence: Secondary | ICD-10-CM | POA: Diagnosis not present

## 2020-05-26 DIAGNOSIS — C539 Malignant neoplasm of cervix uteri, unspecified: Secondary | ICD-10-CM | POA: Insufficient documentation

## 2020-05-26 DIAGNOSIS — Z5111 Encounter for antineoplastic chemotherapy: Secondary | ICD-10-CM | POA: Diagnosis not present

## 2020-05-26 DIAGNOSIS — C55 Malignant neoplasm of uterus, part unspecified: Secondary | ICD-10-CM | POA: Diagnosis not present

## 2020-05-26 MED ORDER — FLUDEOXYGLUCOSE F - 18 (FDG) INJECTION
11.1100 | Freq: Once | INTRAVENOUS | Status: AC | PRN
  Administered 2020-05-26: 11.11 via INTRAVENOUS

## 2020-05-26 NOTE — Telephone Encounter (Signed)
[facility].  They did not see the slide custody agreement faxed on Friday, 05/21/20 until today.  They will send the slides today.

## 2020-05-26 NOTE — Progress Notes (Signed)
Gynecologic Oncology Return Clinic Visit  6/16  Reason for Visit: repeat pelvic exam  Treatment History: Oncology History  Cervical cancer ([place])  02/09/2020 Initial Diagnosis   Between 6-8 months ago, the patient endorses having irregular spotting intermittnetly every few weeks that would last for a few days. This did not change over time and she describes it as light spotting. This was the first bleeding she had after menopause. She ultimately saw her PCP who performed a pap in March which returned showing HSIL. The patient tells me today that this is the first pap test she has had ever. She was then referred to gyn and underwent EMB/LEEP and transvaginal ultrasound for work-up of her PMB and high grade cervical dysplasia. Unfortunately, her pathology revealed SCC of the cervix   05/03/2020 Pathology Results   Endocervical curettings show fragments of squamous epithelium with high-grade squamous intraepithelial lesion, CIN-3 as well as fragments of invasive squamous cell carcinoma within the endometrial biopsy.  The invasive squamous cell carcinoma have depth of invasion more than 3 mm, with at least 10 mm, thickness almost 1.2 mm.   05/18/2020 Imaging   1. No findings of metastatic disease to the abdomen/pelvis. 2. There is a small amount of gas either along the cervical canal or the adjacent fornix. A well-defined mass is not seen. 3. Chronic bilateral pars defects at L5 with 7 mm grade 1 anterolisthesis of L5 on S1 and resulting mild left foraminal stenosis. 4. Suspected right foraminal disc protrusion at L3-4 possibly causing mild right foraminal impingement.     05/21/2020 Cancer Staging   Staging form: Cervix Uteri, AJCC Version 9 - Clinical stage from 05/21/2020: FIGO Stage IIB (cT2b, cM0) - Signed by Heath Lark, MD on 05/21/2020   05/31/2020 -  Chemotherapy   The patient had palonosetron (ALOXI) injection 0.25 mg, 0.25 mg, Intravenous,  Once, 0 of 6 cycles CISplatin (PLATINOL) 76 mg in  sodium chloride 0.9 % 250 mL chemo infusion, 40 mg/m2 = 76 mg, Intravenous,  Once, 0 of 6 cycles fosaprepitant (EMEND) 150 mg in sodium chloride 0.9 % 145 mL IVPB, 150 mg, Intravenous,  Once, 0 of 6 cycles  for chemotherapy treatment.      Interval History: The patient reports overall doing well since her visit with me.  Her bleeding has decreased over time.  She continues to endorse diarrhea which started with her Lexapro.  She denies any other changes to how she has been feeling or new symptoms.  Past Medical/Surgical History: Past Medical History:  Diagnosis Date  . Cervical cancer ([place])   . Obesity (BMI 30-39.9)     History reviewed. No pertinent surgical history.  Family History  Problem Relation Age of Onset  . Stroke Maternal Grandmother   . Colon cancer Neg Hx   . Breast cancer Neg Hx   . Endometrial cancer Neg Hx   . Ovarian cancer Neg Hx     Social History   Socioeconomic History  . Marital status: Married    Spouse name: Dominica Severin  . Number of children: 0  . Years of education: Not on file  . Highest education level: Not on file  Occupational History  . Occupation: Optometrist  Tobacco Use  . Smoking status: Former Research scientist (life sciences)  . Smokeless tobacco: Never Used  Vaping Use  . Vaping Use: Never used  Substance and Sexual Activity  . Alcohol use: Not Currently  . Drug use: Never  . Sexual activity: Not on file  Other Topics Concern  .  Not on file  Social History Narrative  . Not on file   Social Determinants of Health   Financial Resource Strain:   . Difficulty of Paying Living Expenses:   Food Insecurity:   . Worried About Charity fundraiser in the Last Year:   . Arboriculturist in the Last Year:   Transportation Needs:   . Film/video editor (Medical):   Marland Kitchen Lack of Transportation (Non-Medical):   Physical Activity:   . Days of Exercise per Week:   . Minutes of Exercise per Session:   Stress:   . Feeling of Stress :   Social Connections:   .  Frequency of Communication with Friends and Family:   . Frequency of Social Gatherings with Friends and Family:   . Attends Religious Services:   . Active Member of Clubs or Organizations:   . Attends Archivist Meetings:   Marland Kitchen Marital Status:     Current Medications:  Current Outpatient Medications:  .  Acetaminophen (TYLENOL PO), Take by mouth. PRN, Disp: , Rfl:  .  aspirin EC 81 MG tablet, Take 81 mg by mouth every 4 (four) hours as needed., Disp: , Rfl:  .  escitalopram (LEXAPRO) 5 MG tablet, Take 5 mg by mouth at bedtime., Disp: , Rfl:  .  lidocaine-prilocaine (EMLA) cream, Apply to affected area once, Disp: 30 g, Rfl: 3 .  ondansetron (ZOFRAN) 8 MG tablet, Take 1 tablet (8 mg total) by mouth every 8 (eight) hours as needed., Disp: 30 tablet, Rfl: 1 .  prochlorperazine (COMPAZINE) 10 MG tablet, Take 1 tablet (10 mg total) by mouth every 6 (six) hours as needed (Nausea or vomiting)., Disp: 30 tablet, Rfl: 1  Review of Systems: Reports abdominal pain, diarrhea, urinary frequency, incontinence, pelvic pain, vaginal bleeding, back pain, bruising/bleeding easily, anxiety, and depression. Denies appetite changes, fevers, chills, fatigue, unexplained weight changes. Denies hearing loss, neck lumps or masses, mouth sores, ringing in ears or voice changes. Denies cough or wheezing.  Denies shortness of breath. Denies chest pain or palpitations. Denies leg swelling. Denies abdominal distention, blood in stools, constipation, nausea, vomiting, or early satiety. Denies pain with intercourse, dysuria, hematuria. Denies hot flashes, or vaginal discharge.   Denies joint pain or muscle pain/cramps. Denies itching, rash, or wounds. Denies dizziness, headaches, numbness or seizures. Denies swollen lymph nodes or glands. Denies confusion, or decreased concentration.  Physical Exam: BP (!) 149/88 (BP Location: Left Arm, Patient Position: Sitting)   Pulse 64   Temp 98.6 F (37 C) (Oral)    Resp 18   Ht 5\' 7"  (1.702 m)   Wt 219 lb 6 oz (99.5 kg)   SpO2 100%   BMI 34.36 kg/m  General: Alert, oriented, no acute distress. HEENT: Normocephalic, atraumatic, sclera anicteric. Chest: Unlabored breathing on room air. GU: Normal appearing external genitalia without erythema, excoriation, or lesions.  Speculum exam reveals healing cone bed although still significantly friable and abnormal appearing glandular tissue.  On bimanual exam, cervix is fairly flush with the vaginal apex, it is firm and barrel-shaped.  There continues to be thickness on rectovaginal exam suspicious for parametrial involvement.  Laboratory & Radiologic Studies: CT A/P on 6/8: IMPRESSION: 1. No findings of metastatic disease to the abdomen/pelvis. 2. There is a small amount of gas either along the cervical canal or the adjacent fornix. A well-defined mass is not seen. 3. Chronic bilateral pars defects at L5 with 7 mm grade 1 anterolisthesis of L5 on S1  and resulting mild left foraminal stenosis. 4. Suspected right foraminal disc protrusion at L3-4 possibly causing mild right foraminal impingement.  PET performed today  Assessment & Plan: Kelly Hanson is a 59 y.o. woman with clinical Stage IIB SCC of the cervix.   Given no obvious mass on CT scan, I plan to have the patient return today for repeat exam now that she is further out from her LEEP procedure to see if what I felt as parametrial involvement was actually due to inflammation from recent procedure.  Unfortunately, I continue to feel some thickness concerning for parametrial involvement.  While her PET scan has not been read by radiology, we discussed findings on my review that do not show obvious evidence of metastatic disease although most if not all of the cervix appears to be involved.  I am still waiting on LVSI information from outside pathologist.  I will let radiation and medical oncology know that I recommend proceeding as we had planned with  primary chemoradiation.  Patient understanding of present plan.  15 minutes of total time was spent for this patient encounter, including preparation, face-to-face counseling with the patient and coordination of care, and documentation of the encounter.  Jeral Pinch, MD  Division of Gynecologic Oncology  Department of Obstetrics and Gynecology  [facility]

## 2020-05-26 NOTE — Patient Instructions (Signed)
I will call you with the PET results if different than we discussed today. Otherwise I will release them to you in mychart with a note.

## 2020-05-26 NOTE — Telephone Encounter (Signed)
Received call from Dr. Janit Bern, Pathologist with [facility].  Per Dr. Janit Bern, she does not see any LVSI on patient's biopsy from 05/03/2020.  She will add an addendum to the original report.

## 2020-05-26 NOTE — Telephone Encounter (Signed)
Mal Amabile with appointment for port placement on Thursday, 06/03/20 with 8 am arrival (NPO after midnight, needs a driver).  Also advised her that chemotherapy will start on 06/07/20 and that 05/31/20 appointment has been canceled.  She verbalized understanding and agreement.

## 2020-05-27 ENCOUNTER — Telehealth: Payer: Self-pay | Admitting: Oncology

## 2020-05-27 ENCOUNTER — Telehealth: Payer: Self-pay | Admitting: *Deleted

## 2020-05-27 LAB — GLUCOSE, CAPILLARY: Glucose-Capillary: 94 mg/dL (ref 70–99)

## 2020-05-27 NOTE — Telephone Encounter (Signed)
[facility] Psychosocial Distress Screening Clinical Social Work  Clinical Social Work was referred by distress screening protocol.  The patient scored an 8 on the Psychosocial Distress Thermometer which indicates severe distress. Clinical Social Worker contacted patient by phone to assess for distress and other psychosocial needs. Kelly Hanson indicated no distress or concerns at this time.  She reported she has medical plan for treatment and information regarding diagnosis.  CSW briefly reviewed supportive role and encouraged patient to follow up as needed.  ONCBCN DISTRESS SCREENING 05/19/2020  Distress experienced in past week (1-10) 8  Information Concerns Type Lack of info about diagnosis;Lack of info about treatment  Referral to clinical social work No   Clinical Social Worker follow up needed: no  If yes, follow up plan:  Gwinda Maine, LCSW

## 2020-05-27 NOTE — Telephone Encounter (Signed)
Requested CBC p diff and CMP to be drawn with port placement per Dr. Alvy Bimler with [facility] in IR.

## 2020-05-28 ENCOUNTER — Other Ambulatory Visit: Payer: Self-pay | Admitting: Radiology

## 2020-05-31 ENCOUNTER — Ambulatory Visit: Payer: BC Managed Care – PPO | Admitting: Radiation Oncology

## 2020-05-31 ENCOUNTER — Ambulatory Visit: Payer: BC Managed Care – PPO

## 2020-06-01 ENCOUNTER — Ambulatory Visit: Payer: BC Managed Care – PPO

## 2020-06-01 NOTE — Progress Notes (Signed)
Pharmacist Chemotherapy Monitoring - Initial Assessment    Anticipated start date: 06/07/20  Regimen:  . Are orders appropriate based on the patient's diagnosis, regimen, and cycle? Yes . Does the plan date match the patient's scheduled date? Yes . Is the sequencing of drugs appropriate? Yes . Are the premedications appropriate for the patient's regimen? Yes . Prior Authorization for treatment is: Not Started o If applicable, is the correct biosimilar selected based on the patient's insurance? not applicable  Organ Function and Labs: Marland Kitchen Are dose adjustments needed based on the patient's renal function, hepatic function, or hematologic function? Yes . Are appropriate labs ordered prior to the start of patient's treatment? Yes . Other organ system assessment, if indicated: N/A . The following baseline labs, if indicated, have been ordered: cisplatin: K, Mg  Dose Assessment: . Are the drug doses appropriate? Yes . Are the following correct: o Drug concentrations Yes o IV fluid compatible with drug Yes o Administration routes Yes o Timing of therapy Yes . If applicable, does the patient have documented access for treatment and/or plans for port-a-cath placement? yes . If applicable, have lifetime cumulative doses been properly documented and assessed? not applicable Lifetime Dose Tracking  No doses have been documented on this patient for the following tracked chemicals: Doxorubicin, Epirubicin, Idarubicin, Daunorubicin, Mitoxantrone, Bleomycin, Oxaliplatin, Carboplatin, Liposomal Doxorubicin  o   Toxicity Monitoring/Prevention: . The patient has the following take home antiemetics prescribed: Prochlorperazine . The patient has the following take home medications prescribed: N/A . Medication allergies and previous infusion related reactions, if applicable, have been reviewed and addressed. Yes . The patient's current medication list has been assessed for drug-drug interactions with their  chemotherapy regimen. no significant drug-drug interactions were identified on review.  Order Review: . Are the treatment plan orders signed? Yes . Is the patient scheduled to see a provider prior to their treatment? No  I verify that I have reviewed each item in the above checklist and answered each question accordingly.  Kelly Hanson 06/01/2020 11:27 AM

## 2020-06-02 ENCOUNTER — Other Ambulatory Visit: Payer: Self-pay | Admitting: Physician Assistant

## 2020-06-02 ENCOUNTER — Ambulatory Visit: Payer: BC Managed Care – PPO

## 2020-06-03 ENCOUNTER — Encounter (HOSPITAL_COMMUNITY): Payer: Self-pay

## 2020-06-03 ENCOUNTER — Other Ambulatory Visit: Payer: Self-pay

## 2020-06-03 ENCOUNTER — Ambulatory Visit (HOSPITAL_COMMUNITY)
Admission: RE | Admit: 2020-06-03 | Discharge: 2020-06-03 | Disposition: A | Discharge status: Home / Self Care | Payer: BC Managed Care – PPO | Source: Ambulatory Visit | Attending: Hematology and Oncology | Admitting: Hematology and Oncology

## 2020-06-03 ENCOUNTER — Inpatient Hospital Stay: Payer: BC Managed Care – PPO

## 2020-06-03 ENCOUNTER — Ambulatory Visit: Payer: BC Managed Care – PPO

## 2020-06-03 DIAGNOSIS — Z79899 Other long term (current) drug therapy: Secondary | ICD-10-CM | POA: Diagnosis not present

## 2020-06-03 DIAGNOSIS — C760 Malignant neoplasm of head, face and neck: Secondary | ICD-10-CM | POA: Diagnosis not present

## 2020-06-03 DIAGNOSIS — Z7982 Long term (current) use of aspirin: Secondary | ICD-10-CM | POA: Insufficient documentation

## 2020-06-03 DIAGNOSIS — E669 Obesity, unspecified: Secondary | ICD-10-CM | POA: Diagnosis not present

## 2020-06-03 DIAGNOSIS — Z6830 Body mass index (BMI) 30.0-30.9, adult: Secondary | ICD-10-CM | POA: Insufficient documentation

## 2020-06-03 DIAGNOSIS — Z87891 Personal history of nicotine dependence: Secondary | ICD-10-CM | POA: Diagnosis not present

## 2020-06-03 DIAGNOSIS — Z5111 Encounter for antineoplastic chemotherapy: Secondary | ICD-10-CM | POA: Diagnosis not present

## 2020-06-03 DIAGNOSIS — C539 Malignant neoplasm of cervix uteri, unspecified: Secondary | ICD-10-CM | POA: Insufficient documentation

## 2020-06-03 HISTORY — PX: IR IMAGING GUIDED PORT INSERTION: IMG5740

## 2020-06-03 LAB — CBC WITH DIFFERENTIAL/PLATELET
Abs Immature Granulocytes: 0 10*3/uL (ref 0.00–0.07)
Basophils Absolute: 0 10*3/uL (ref 0.0–0.1)
Basophils Relative: 1 %
Eosinophils Absolute: 0.1 10*3/uL (ref 0.0–0.5)
Eosinophils Relative: 1 %
HCT: 44.2 % (ref 36.0–46.0)
Hemoglobin: 14.6 g/dL (ref 12.0–15.0)
Immature Granulocytes: 0 %
Lymphocytes Relative: 20 %
Lymphs Abs: 1.4 10*3/uL (ref 0.7–4.0)
MCH: 31.6 pg (ref 26.0–34.0)
MCHC: 33 g/dL (ref 30.0–36.0)
MCV: 95.7 fL (ref 80.0–100.0)
Monocytes Absolute: 0.5 10*3/uL (ref 0.1–1.0)
Monocytes Relative: 8 %
Neutro Abs: 4.9 10*3/uL (ref 1.7–7.7)
Neutrophils Relative %: 70 %
Platelets: 238 10*3/uL (ref 150–400)
RBC: 4.62 MIL/uL (ref 3.87–5.11)
RDW: 13.2 % (ref 11.5–15.5)
WBC: 6.9 10*3/uL (ref 4.0–10.5)
nRBC: 0 % (ref 0.0–0.2)

## 2020-06-03 LAB — COMPREHENSIVE METABOLIC PANEL
ALT: 21 U/L (ref 0–44)
AST: 18 U/L (ref 15–41)
Albumin: 4.7 g/dL (ref 3.5–5.0)
Alkaline Phosphatase: 63 U/L (ref 38–126)
Anion gap: 12 (ref 5–15)
BUN: 19 mg/dL (ref 6–20)
CO2: 23 mmol/L (ref 22–32)
Calcium: 9.1 mg/dL (ref 8.9–10.3)
Chloride: 105 mmol/L (ref 98–111)
Creatinine, Ser: 0.7 mg/dL (ref 0.44–1.00)
GFR calc Af Amer: >60 mL/min (ref >60)
GFR calc non Af Amer: >60 mL/min (ref >60)
Glucose, Bld: 105 mg/dL — ABNORMAL HIGH (ref 70–99)
Potassium: 3.7 mmol/L (ref 3.5–5.1)
Sodium: 140 mmol/L (ref 135–145)
Total Bilirubin: 0.8 mg/dL (ref 0.3–1.2)
Total Protein: 7.3 g/dL (ref 6.5–8.1)

## 2020-06-03 LAB — PROTIME-INR
INR: 1.1 (ref 0.8–1.2)
Prothrombin Time: 13.4 seconds (ref 11.4–15.2)

## 2020-06-03 MED ORDER — HEPARIN SOD (PORK) LOCK FLUSH 100 UNIT/ML IV SOLN
INTRAVENOUS | Status: DC | PRN
  Administered 2020-06-03: 500 [IU] via INTRAVENOUS

## 2020-06-03 MED ORDER — LIDOCAINE HCL 1 % IJ SOLN
INTRAMUSCULAR | Status: AC
  Filled 2020-06-03: qty 20

## 2020-06-03 MED ORDER — MIDAZOLAM HCL 2 MG/2ML IJ SOLN
INTRAMUSCULAR | Status: AC
  Filled 2020-06-03: qty 2

## 2020-06-03 MED ORDER — FENTANYL CITRATE (PF) 100 MCG/2ML IJ SOLN
INTRAMUSCULAR | Status: DC | PRN
  Administered 2020-06-03 (×4): 50 ug via INTRAVENOUS

## 2020-06-03 MED ORDER — MIDAZOLAM HCL 2 MG/2ML IJ SOLN
INTRAMUSCULAR | Status: AC
  Filled 2020-06-03: qty 4

## 2020-06-03 MED ORDER — CEFAZOLIN SODIUM-DEXTROSE 2-4 GM/100ML-% IV SOLN
INTRAVENOUS | Status: AC
  Administered 2020-06-03: 2 g via INTRAVENOUS
  Filled 2020-06-03: qty 100

## 2020-06-03 MED ORDER — LIDOCAINE HCL (PF) 1 % IJ SOLN
INTRAMUSCULAR | Status: DC | PRN
  Administered 2020-06-03: 10 mL

## 2020-06-03 MED ORDER — HEPARIN SOD (PORK) LOCK FLUSH 100 UNIT/ML IV SOLN
INTRAVENOUS | Status: AC
  Filled 2020-06-03: qty 5

## 2020-06-03 MED ORDER — FENTANYL CITRATE (PF) 100 MCG/2ML IJ SOLN
INTRAMUSCULAR | Status: AC
  Filled 2020-06-03: qty 2

## 2020-06-03 MED ORDER — SODIUM CHLORIDE 0.9 % IV SOLN: INTRAVENOUS | Status: DC

## 2020-06-03 MED ORDER — CEFAZOLIN SODIUM-DEXTROSE 2-4 GM/100ML-% IV SOLN: 2.0000 g | INTRAVENOUS | Status: AC

## 2020-06-03 MED ORDER — MIDAZOLAM HCL 2 MG/2ML IJ SOLN
INTRAMUSCULAR | Status: DC | PRN
  Administered 2020-06-03 (×6): 1 mg via INTRAVENOUS

## 2020-06-03 NOTE — H&P (Signed)
Chief Complaint: Patient was seen in consultation today for port placement at the request of [place]  Referring Physician(s): [place]  Supervising Physician: Corrie Mckusick  Patient Status: [facility] - Out-pt  History of Present Illness: Kelly Hanson is a 59 y.o. female with cervical cancer. She is referred for port placement. PMHx, meds, labs, imaging, allergies reviewed. Feels well, no recent fevers, chills, illness. Has been NPO today as directed.   Past Medical History:  Diagnosis Date  . Cervical cancer ([place])   . Obesity (BMI 30-39.9)     No past surgical history on file.  Allergies: Patient has no known allergies.  Medications: Prior to Admission medications   Medication Sig Start Date End Date Taking? Authorizing Provider  Acetaminophen (TYLENOL PO) Take by mouth. PRN    [provider]  aspirin EC 81 MG tablet Take 81 mg by mouth every 4 (four) hours as needed.    [provider]  escitalopram (LEXAPRO) 5 MG tablet Take 5 mg by mouth at bedtime. 03/23/20   [provider]  lidocaine-prilocaine (EMLA) cream Apply to affected area once 05/21/20   Heath Lark, MD  ondansetron (ZOFRAN) 8 MG tablet Take 1 tablet (8 mg total) by mouth every 8 (eight) hours as needed. 05/21/20   Heath Lark, MD  prochlorperazine (COMPAZINE) 10 MG tablet Take 1 tablet (10 mg total) by mouth every 6 (six) hours as needed (Nausea or vomiting). 05/21/20   Heath Lark, MD     Family History  Problem Relation Age of Onset  . Stroke Maternal Grandmother   . Colon cancer Neg Hx   . Breast cancer Neg Hx   . Endometrial cancer Neg Hx   . Ovarian cancer Neg Hx     Social History   Socioeconomic History  . Marital status: Married    Spouse name: Dominica Severin  . Number of children: 0  . Years of education: Not on file  . Highest education level: Not on file  Occupational History  . Occupation: Optometrist  Tobacco Use  . Smoking status: Former Research scientist (life sciences)  . Smokeless  tobacco: Never Used  Vaping Use  . Vaping Use: Never used  Substance and Sexual Activity  . Alcohol use: Not Currently  . Drug use: Never  . Sexual activity: Not on file  Other Topics Concern  . Not on file  Social History Narrative  . Not on file   Social Determinants of Health   Financial Resource Strain:   . Difficulty of Paying Living Expenses:   Food Insecurity:   . Worried About Charity fundraiser in the Last Year:   . Arboriculturist in the Last Year:   Transportation Needs:   . Film/video editor (Medical):   Marland Kitchen Lack of Transportation (Non-Medical):   Physical Activity:   . Days of Exercise per Week:   . Minutes of Exercise per Session:   Stress:   . Feeling of Stress :   Social Connections:   . Frequency of Communication with Friends and Family:   . Frequency of Social Gatherings with Friends and Family:   . Attends Religious Services:   . Active Member of Clubs or Organizations:   . Attends Archivist Meetings:   Marland Kitchen Marital Status:      Review of Systems: A 12 point ROS discussed and pertinent positives are indicated in the HPI above.  All other systems are negative.  Review of Systems  Vital Signs: BP (!) 133/92 Comment: left  Pulse 73   Temp 97.8 F (36.6 C)   Resp 16   SpO2 100%   Physical Exam Constitutional:      Appearance: Normal appearance.  HENT:     Mouth/Throat:     Mouth: Mucous membranes are moist.     Pharynx: Oropharynx is clear.  Cardiovascular:     Rate and Rhythm: Normal rate and regular rhythm.     Heart sounds: Normal heart sounds.  Pulmonary:     Effort: Pulmonary effort is normal. No respiratory distress.     Breath sounds: Normal breath sounds.  Skin:    General: Skin is warm and dry.  Neurological:     General: No focal deficit present.     Mental Status: She is alert and oriented to person, place, and time.  Psychiatric:        Mood and Affect: Mood normal.        Thought Content: Thought content  normal.        Judgment: Judgment normal.     Imaging: CT Abdomen Pelvis W Contrast  Result Date: 05/18/2020 CLINICAL DATA:  Staging of cervical cancer. Lower abdominal pain and discomfort for 6 months with diarrhea and occasional vaginal bleeding. EXAM: CT ABDOMEN AND PELVIS WITH CONTRAST TECHNIQUE: Multidetector CT imaging of the abdomen and pelvis was performed using the standard protocol following bolus administration of intravenous contrast. CONTRAST:  138mL OMNIPAQUE IOHEXOL 300 MG/ML  SOLN COMPARISON:  None. FINDINGS: Lower chest: 0.5 by 0.4 cm right lower lobe pulmonary nodule on image 17/6. Hepatobiliary: Unremarkable Pancreas: Unremarkable Spleen: Unremarkable Adrenals/Urinary Tract: Mild scarring in the left kidney upper pole. Adrenal glands unremarkable. Urinary bladder appears normal. Stomach/Bowel: Unremarkable Vascular/Lymphatic: No pathologic adenopathy. Minimal atherosclerotic calcification in the left common iliac artery. Reproductive: There is a small amount of gas either along the cervical canal or the adjacent fornix on image 71/5. A well-defined mass is not seen. No widening of the endometrial stripe. The ovaries and adnexa appear normal. Other: No supplemental non-categorized findings. Musculoskeletal: Chronic bilateral pars defects at L5 with 7 mm grade 1 anterolisthesis of L5 on S1 and resulting mild left foraminal stenosis. Suspected right foraminal disc protrusion at L3-4 possibly causing mild right foraminal impingement. IMPRESSION: 1. No findings of metastatic disease to the abdomen/pelvis. 2. There is a small amount of gas either along the cervical canal or the adjacent fornix. A well-defined mass is not seen. 3. Chronic bilateral pars defects at L5 with 7 mm grade 1 anterolisthesis of L5 on S1 and resulting mild left foraminal stenosis. 4. Suspected right foraminal disc protrusion at L3-4 possibly causing mild right foraminal impingement. Electronically Signed   By: Van Clines M.D.   On: 05/18/2020 16:47   NM PET Image Initial (PI) Skull Base To Thigh  Result Date: 05/26/2020 CLINICAL DATA:  Initial treatment strategy for uterine carcinoma. EXAM: NUCLEAR MEDICINE PET SKULL BASE TO THIGH TECHNIQUE: 11.1 mCi F-18 FDG was injected intravenously. Full-ring PET imaging was performed from the skull base to thigh after the radiotracer. CT data was obtained and used for attenuation correction and anatomic localization. Fasting blood glucose: 94 mg/dl COMPARISON:  CT 05/18/2020 FINDINGS: Mediastinal blood pool activity: SUV max 3.2 Liver activity: SUV max NA NECK: No hypermetabolic lymph nodes in the neck. Diffuse hypermetabolic activity in the thyroid gland is favored thyroiditis over thyroid carcinoma. Incidental CT findings: none CHEST: No hypermetabolic mediastinal or hilar nodes. No suspicious pulmonary nodules on the CT scan. Incidental CT findings: none ABDOMEN/PELVIS:  There is a focus of activity within the distal stomach (gastric antrum). The hypermetabolic activity activity occurs over a 2 to 3 cm segment of the stomach wall with SUV max equal 8.6 (image 105) No abnormal activity in liver. No hypermetabolic abdominopelvic lymph nodes. Within the pelvis, there is intense metabolic activity at the uterine cervix with SUV max equal 21.8. The activity is circumferential as seen on axial image 181. No hypermetabolic lymph nodes in the pelvis. No retroperitoneal adenopathy. Incidental CT findings: No adnexal abnormality. SKELETON: No focal hypermetabolic activity to suggest skeletal metastasis. Incidental CT findings: none IMPRESSION: 1. Intensely hypermetabolic circumferential metabolic activity in the uterine cervix consistent with cervical carcinoma. 2. No evidence of metastatic disease in the pelvis. No metastatic lymphadenopathy. 3. No evidence of distant metastatic disease. 4. Focus of metabolic activity in the gastric antrum would be unlikely location for a cervical  carcinoma metastasis. Favor gastritis or potentially primary gastric neoplasm. Consider upper endoscopy for further evaluation. 5. Diffuse activity in the thyroid gland is favored thyroiditis. Electronically Signed   By: Suzy Bouchard M.D.   On: 05/26/2020 11:41    Labs:  CBC: No results for input(s): WBC, HGB, HCT, PLT in the last 8760 hours.  COAGS: No results for input(s): INR, APTT in the last 8760 hours.  BMP: Recent Labs    05/14/20 1211  NA 140  K 3.8  CL 106  CO2 23  GLUCOSE 97  BUN 15  CALCIUM 9.4  CREATININE 0.87  GFRNONAA >60  GFRAA >60    LIVER FUNCTION TESTS: Recent Labs    05/14/20 1211  BILITOT 0.7  AST 20  ALT 31  ALKPHOS 73  PROT 7.4  ALBUMIN 4.2    TUMOR MARKERS: No results for input(s): AFPTM, CEA, CA199, CHROMGRNA in the last 8760 hours.  Assessment and Plan: Cervical cancer For port placement Labs pending Risks and benefits of image guided port-a-catheter placement was discussed with the patient including, but not limited to bleeding, infection, pneumothorax, or fibrin sheath development and need for additional procedures.  All of the patient's questions were answered, patient is agreeable to proceed. Consent signed and in chart.    Thank you for this interesting consult.  I greatly enjoyed meeting Chi [place] and look forward to participating in their care.  A copy of this report was sent to the requesting provider on this date.  Electronically Signed: Ascencion Dike, PA-C 06/03/2020, 8:45 AM   I spent a total of 20 minutes in face to face in clinical consultation, greater than 50% of which was counseling/coordinating care for port placement

## 2020-06-03 NOTE — Discharge Instructions (Signed)
Urgent needs - IR on call MD [phone]  Wound - May remove dressing and shower in 24 to 48 hours.  Keep site clean and dry.  Replace with bandaid. Do not submerge in tub or water until site healing well.  If ordered by your provider, may start Emla cream in 2 weeks or after incision is healed.  After completion of treatment, your provider should have you set up for monthly port flushes.                                                            Moderate Conscious Sedation, Adult, Care After These instructions provide you with information about caring for yourself after your procedure. Your health care provider may also give you more specific instructions. Your treatment has been planned according to current medical practices, but problems sometimes occur. Call your health care provider if you have any problems or questions after your procedure. What can I expect after the procedure? After your procedure, it is common:  To feel sleepy for several hours.  To feel clumsy and have poor balance for several hours.  To have poor judgment for several hours.  To vomit if you eat too soon. Follow these instructions at home: For at least 24 hours after the procedure:  Do not: ? Participate in activities where you could fall or become injured. ? Drive. ? Use heavy machinery. ? Drink alcohol. ? Take sleeping pills or medicines that cause drowsiness. ? Make important decisions or sign legal documents. ? Take care of children on your own.  Rest. Eating and drinking  Follow the diet recommended by your health care provider.  If you vomit: ? Drink water, juice, or soup when you can drink without vomiting. ? Make sure you have little or no nausea before eating solid foods. General instructions  Have a responsible adult stay with you until you are awake and alert.  Take over-the-counter and prescription medicines only as told by your health care provider.  If you smoke, do not smoke  without supervision.  Keep all follow-up visits as told by your health care provider. This is important. Contact a health care provider if:  You keep feeling nauseous or you keep vomiting.  You feel light-headed.  You develop a rash.  You have a fever. Get help right away if:  You have trouble breathing. This information is not intended to replace advice given to you by your health care provider. Make sure you discuss any questions you have with your health care provider. Document Revised: 11/09/2017 Document Reviewed: 03/18/2016 Elsevier Patient Education  2020 Elsevier Inc.   Implanted Port Insertion, Care After This sheet gives you information about how to care for yourself after your procedure. Your health care provider may also give you more specific instructions. If you have problems or questions, contact your health care provider. What can I expect after the procedure? After the procedure, it is common to have:  Discomfort at the port insertion site.  Bruising on the skin over the port. This should improve over 3-4 days. Follow these instructions at home: Port care  After your port is placed, you will get a manufacturer's information card. The card has information about your port. Keep this card with you at all times.  Take care of   the port as told by your health care provider. Ask your health care provider if you or a family member can get training for taking care of the port at home. A home health care nurse may also take care of the port.  Make sure to remember what type of port you have. Incision care  Follow instructions from your health care provider about how to take care of your port insertion site. Make sure you: ? Wash your hands with soap and water before and after you change your bandage (dressing). If soap and water are not available, use hand sanitizer. ? Change your dressing as told by your health care provider. ? Leave stitches (sutures), skin glue, or  adhesive strips in place. These skin closures may need to stay in place for 2 weeks or longer. If adhesive strip edges start to loosen and curl up, you may trim the loose edges. Do not remove adhesive strips completely unless your health care provider tells you to do that.  Check your port insertion site every day for signs of infection. Check for: ? Redness, swelling, or pain. ? Fluid or blood. ? Warmth. ? Pus or a bad smell. Activity  Return to your normal activities as told by your health care provider. Ask your health care provider what activities are safe for you.  Do not lift anything that is heavier than 10 lb (4.5 kg), or the limit that you are told, until your health care provider says that it is safe. General instructions  Take over-the-counter and prescription medicines only as told by your health care provider.  Do not take baths, swim, or use a hot tub until your health care provider approves. Ask your health care provider if you may take showers. You may only be allowed to take sponge baths.  Do not drive for 24 hours if you were given a sedative during your procedure.  Wear a medical alert bracelet in case of an emergency. This will tell any health care providers that you have a port.  Keep all follow-up visits as told by your health care provider. This is important. Contact a health care provider if:  You cannot flush your port with saline as directed, or you cannot draw blood from the port.  You have a fever or chills.  You have redness, swelling, or pain around your port insertion site.  You have fluid or blood coming from your port insertion site.  Your port insertion site feels warm to the touch.  You have pus or a bad smell coming from the port insertion site. Get help right away if:  You have chest pain or shortness of breath.  You have bleeding from your port that you cannot control. Summary  Take care of the port as told by your health care provider.  Keep the manufacturer's information card with you at all times.  Change your dressing as told by your health care provider.  Contact a health care provider if you have a fever or chills or if you have redness, swelling, or pain around your port insertion site.  Keep all follow-up visits as told by your health care provider. This information is not intended to replace advice given to you by your health care provider. Make sure you discuss any questions you have with your health care provider. Document Revised: 06/25/2018 Document Reviewed: 06/25/2018 Elsevier Patient Education  2020 Elsevier Inc.   

## 2020-06-03 NOTE — Procedures (Signed)
Interventional Radiology Procedure Note  Procedure: Placement of a right IJ approach single lumen PowerPort.  Tip is positioned at the superior cavoatrial junction and catheter is ready for immediate use.  Complications: None Recommendations:  - Ok to shower tomorrow - Do not submerge for 7 days - Routine line care   Signed,  Ivry Pigue S. Daleyza Gadomski, DO   

## 2020-06-04 ENCOUNTER — Ambulatory Visit: Payer: BC Managed Care – PPO

## 2020-06-06 DIAGNOSIS — Z510 Encounter for antineoplastic radiation therapy: Secondary | ICD-10-CM | POA: Diagnosis not present

## 2020-06-06 DIAGNOSIS — C539 Malignant neoplasm of cervix uteri, unspecified: Secondary | ICD-10-CM | POA: Diagnosis not present

## 2020-06-07 ENCOUNTER — Other Ambulatory Visit: Payer: Self-pay

## 2020-06-07 ENCOUNTER — Ambulatory Visit
Admission: RE | Admit: 2020-06-07 | Discharge: 2020-06-07 | Disposition: A | Discharge status: Home / Self Care | Payer: BC Managed Care – PPO | Source: Ambulatory Visit | Attending: Radiation Oncology | Admitting: Radiation Oncology

## 2020-06-07 ENCOUNTER — Inpatient Hospital Stay: Payer: BC Managed Care – PPO

## 2020-06-07 ENCOUNTER — Encounter: Payer: Self-pay | Admitting: Oncology

## 2020-06-07 VITALS — BP 135/88 | HR 75 | Temp 98.2°F | Resp 16

## 2020-06-07 DIAGNOSIS — Z5111 Encounter for antineoplastic chemotherapy: Secondary | ICD-10-CM | POA: Diagnosis not present

## 2020-06-07 DIAGNOSIS — Z6830 Body mass index (BMI) 30.0-30.9, adult: Secondary | ICD-10-CM | POA: Diagnosis not present

## 2020-06-07 DIAGNOSIS — Z7982 Long term (current) use of aspirin: Secondary | ICD-10-CM | POA: Diagnosis not present

## 2020-06-07 DIAGNOSIS — C539 Malignant neoplasm of cervix uteri, unspecified: Secondary | ICD-10-CM | POA: Diagnosis not present

## 2020-06-07 DIAGNOSIS — E669 Obesity, unspecified: Secondary | ICD-10-CM | POA: Diagnosis not present

## 2020-06-07 DIAGNOSIS — Z87891 Personal history of nicotine dependence: Secondary | ICD-10-CM | POA: Diagnosis not present

## 2020-06-07 DIAGNOSIS — Z79899 Other long term (current) drug therapy: Secondary | ICD-10-CM | POA: Diagnosis not present

## 2020-06-07 DIAGNOSIS — Z510 Encounter for antineoplastic radiation therapy: Secondary | ICD-10-CM | POA: Diagnosis not present

## 2020-06-07 MED ORDER — POTASSIUM CHLORIDE 2 MEQ/ML IV SOLN
Freq: Once | INTRAVENOUS | Status: AC
  Filled 2020-06-07: qty 10

## 2020-06-07 MED ORDER — SODIUM CHLORIDE 0.9 % IV SOLN
10.0000 mg | Freq: Once | INTRAVENOUS | Status: AC
  Administered 2020-06-07: 10 mg via INTRAVENOUS
  Filled 2020-06-07: qty 10

## 2020-06-07 MED ORDER — SODIUM CHLORIDE 0.9 % IV SOLN
150.0000 mg | Freq: Once | INTRAVENOUS | Status: AC
  Administered 2020-06-07: 150 mg via INTRAVENOUS
  Filled 2020-06-07: qty 150

## 2020-06-07 MED ORDER — PALONOSETRON HCL INJECTION 0.25 MG/5ML
INTRAVENOUS | Status: AC
  Filled 2020-06-07: qty 5

## 2020-06-07 MED ORDER — PALONOSETRON HCL INJECTION 0.25 MG/5ML
0.2500 mg | Freq: Once | INTRAVENOUS | Status: AC
  Administered 2020-06-07: 0.25 mg via INTRAVENOUS

## 2020-06-07 MED ORDER — HEPARIN SOD (PORK) LOCK FLUSH 100 UNIT/ML IV SOLN
500.0000 [IU] | Freq: Once | INTRAVENOUS | Status: AC | PRN
  Administered 2020-06-07: 500 [IU]
  Filled 2020-06-07: qty 5

## 2020-06-07 MED ORDER — SODIUM CHLORIDE 0.9 % IV SOLN
40.0000 mg/m2 | Freq: Once | INTRAVENOUS | Status: AC
  Administered 2020-06-07: 76 mg via INTRAVENOUS
  Filled 2020-06-07: qty 76

## 2020-06-07 MED ORDER — SODIUM CHLORIDE 0.9 % IV SOLN
Freq: Once | INTRAVENOUS | Status: AC
  Filled 2020-06-07: qty 250

## 2020-06-07 MED ORDER — SODIUM CHLORIDE 0.9% FLUSH
10.0000 mL | INTRAVENOUS | Status: DC | PRN
  Administered 2020-06-07: 10 mL
  Filled 2020-06-07: qty 10

## 2020-06-07 NOTE — Progress Notes (Signed)
Met with Kelly Hanson during her first chemotherapy infusion.  Encouraged her to call if she has any questions or needs.

## 2020-06-07 NOTE — Patient Instructions (Signed)
[facility] Discharge Instructions for Patients Receiving Chemotherapy  Today you received the following chemotherapy agents Cisplatin  To help prevent nausea and vomiting after your treatment, we encourage you to take your nausea medication as prescribed.   If you develop nausea and vomiting that is not controlled by your nausea medication, call the clinic.   BELOW ARE SYMPTOMS THAT SHOULD BE REPORTED IMMEDIATELY:  *FEVER GREATER THAN 100.5 F  *CHILLS WITH OR WITHOUT FEVER  NAUSEA AND VOMITING THAT IS NOT CONTROLLED WITH YOUR NAUSEA MEDICATION  *UNUSUAL SHORTNESS OF BREATH  *UNUSUAL BRUISING OR BLEEDING  TENDERNESS IN MOUTH AND THROAT WITH OR WITHOUT PRESENCE OF ULCERS  *URINARY PROBLEMS  *BOWEL PROBLEMS  UNUSUAL RASH Items with * indicate a potential emergency and should be followed up as soon as possible.  Feel free to call the clinic should you have any questions or concerns. The clinic phone number is [phone].  Please show the CHEMO ALERT CARD at check-in to the Emergency Department and triage nurse.  Cisplatin injection What is this medicine? CISPLATIN (SIS pla tin) is a chemotherapy drug. It targets fast dividing cells, like cancer cells, and causes these cells to die. This medicine is used to treat many types of cancer like bladder, ovarian, and testicular cancers. This medicine may be used for other purposes; ask your health care provider or pharmacist if you have questions. COMMON BRAND NAME(S): Platinol, Platinol -AQ What should I tell my health care provider before I take this medicine? They need to know if you have any of these conditions:  eye disease, vision problems  hearing problems  kidney disease  low blood counts, like white cells, platelets, or red blood cells  tingling of the fingers or toes, or other nerve disorder  an unusual or allergic reaction to cisplatin, carboplatin, oxaliplatin, other medicines, foods, dyes, or  preservatives  pregnant or trying to get pregnant  breast-feeding How should I use this medicine? This drug is given as an infusion into a vein. It is administered in a hospital or clinic by a specially trained health care professional. Talk to your pediatrician regarding the use of this medicine in children. Special care may be needed. Overdosage: If you think you have taken too much of this medicine contact a poison control center or emergency room at once. NOTE: This medicine is only for you. Do not share this medicine with others. What if I miss a dose? It is important not to miss a dose. Call your doctor or health care professional if you are unable to keep an appointment. What may interact with this medicine? This medicine may interact with the following medications:  foscarnet  certain antibiotics like amikacin, gentamicin, neomycin, polymyxin B, streptomycin, tobramycin, vancomycin This list may not describe all possible interactions. Give your health care provider a list of all the medicines, herbs, non-prescription drugs, or dietary supplements you use. Also tell them if you smoke, drink alcohol, or use illegal drugs. Some items may interact with your medicine. What should I watch for while using this medicine? Your condition will be monitored carefully while you are receiving this medicine. You will need important blood work done while you are taking this medicine. This drug may make you feel generally unwell. This is not uncommon, as chemotherapy can affect healthy cells as well as cancer cells. Report any side effects. Continue your course of treatment even though you feel ill unless your doctor tells you to stop. This medicine may increase your   risk of getting an infection. Call your healthcare professional for advice if you get a fever, chills, or sore throat, or other symptoms of a cold or flu. Do not treat yourself. Try to avoid being around people who are sick. Avoid taking  medicines that contain aspirin, acetaminophen, ibuprofen, naproxen, or ketoprofen unless instructed by your healthcare professional. These medicines may hide a fever. This medicine may increase your risk to bruise or bleed. Call your doctor or health care professional if you notice any unusual bleeding. Be careful brushing and flossing your teeth or using a toothpick because you may get an infection or bleed more easily. If you have any dental work done, tell your dentist you are receiving this medicine. Do not become pregnant while taking this medicine or for 14 months after stopping it. Women should inform their healthcare professional if they wish to become pregnant or think they might be pregnant. Men should not father a child while taking this medicine and for 11 months after stopping it. There is potential for serious side effects to an unborn child. Talk to your healthcare professional for more information. Do not breast-feed an infant while taking this medicine. This medicine has caused ovarian failure in some women. This medicine may make it more difficult to get pregnant. Talk to your healthcare professional if you are concerned about your fertility. This medicine has caused decreased sperm counts in some men. This may make it more difficult to father a child. Talk to your healthcare professional if you are concerned about your fertility. Drink fluids as directed while you are taking this medicine. This will help protect your kidneys. Call your doctor or health care professional if you get diarrhea. Do not treat yourself. What side effects may I notice from receiving this medicine? Side effects that you should report to your doctor or health care professional as soon as possible:  allergic reactions like skin rash, itching or hives, swelling of the face, lips, or tongue  blurred vision  changes in vision  decreased hearing or ringing of the ears  nausea, vomiting  pain, redness, or  irritation at site where injected  pain, tingling, numbness in the hands or feet  signs and symptoms of bleeding such as bloody or black, tarry stools; red or dark brown urine; spitting up blood or brown material that looks like coffee grounds; red spots on the skin; unusual bruising or bleeding from the eyes, gums, or nose  signs and symptoms of infection like fever; chills; cough; sore throat; pain or trouble passing urine  signs and symptoms of kidney injury like trouble passing urine or change in the amount of urine  signs and symptoms of low red blood cells or anemia such as unusually weak or tired; feeling faint or lightheaded; falls; breathing problems Side effects that usually do not require medical attention (report to your doctor or health care professional if they continue or are bothersome):  loss of appetite  mouth sores  muscle cramps This list may not describe all possible side effects. Call your doctor for medical advice about side effects. You may report side effects to FDA at 1-800-FDA-1088. Where should I keep my medicine? This drug is given in a hospital or clinic and will not be stored at home. NOTE: This sheet is a summary. It may not cover all possible information. If you have questions about this medicine, talk to your doctor, pharmacist, or health care provider.  2020 Elsevier/Gold Standard (2018-11-22 15:59:17)    

## 2020-06-08 ENCOUNTER — Other Ambulatory Visit: Payer: Self-pay

## 2020-06-08 ENCOUNTER — Ambulatory Visit
Admission: RE | Admit: 2020-06-08 | Discharge: 2020-06-08 | Disposition: A | Discharge status: Home / Self Care | Payer: BC Managed Care – PPO | Source: Ambulatory Visit | Attending: Radiation Oncology | Admitting: Radiation Oncology

## 2020-06-08 DIAGNOSIS — C539 Malignant neoplasm of cervix uteri, unspecified: Secondary | ICD-10-CM | POA: Diagnosis not present

## 2020-06-08 DIAGNOSIS — Z510 Encounter for antineoplastic radiation therapy: Secondary | ICD-10-CM | POA: Diagnosis not present

## 2020-06-09 ENCOUNTER — Telehealth: Payer: Self-pay | Admitting: *Deleted

## 2020-06-09 ENCOUNTER — Other Ambulatory Visit: Payer: Self-pay

## 2020-06-09 ENCOUNTER — Ambulatory Visit
Admission: RE | Admit: 2020-06-09 | Discharge: 2020-06-09 | Disposition: A | Discharge status: Home / Self Care | Payer: BC Managed Care – PPO | Source: Ambulatory Visit | Attending: Radiation Oncology | Admitting: Radiation Oncology

## 2020-06-09 DIAGNOSIS — Z510 Encounter for antineoplastic radiation therapy: Secondary | ICD-10-CM | POA: Diagnosis not present

## 2020-06-09 DIAGNOSIS — C539 Malignant neoplasm of cervix uteri, unspecified: Secondary | ICD-10-CM | POA: Diagnosis not present

## 2020-06-10 ENCOUNTER — Ambulatory Visit
Admission: RE | Admit: 2020-06-10 | Discharge: 2020-06-10 | Disposition: A | Discharge status: Home / Self Care | Payer: BC Managed Care – PPO | Source: Ambulatory Visit | Attending: Radiation Oncology | Admitting: Radiation Oncology

## 2020-06-10 ENCOUNTER — Other Ambulatory Visit: Payer: Self-pay

## 2020-06-10 DIAGNOSIS — C539 Malignant neoplasm of cervix uteri, unspecified: Secondary | ICD-10-CM | POA: Diagnosis not present

## 2020-06-10 DIAGNOSIS — Z510 Encounter for antineoplastic radiation therapy: Secondary | ICD-10-CM | POA: Diagnosis not present

## 2020-06-11 ENCOUNTER — Other Ambulatory Visit: Payer: Self-pay

## 2020-06-11 ENCOUNTER — Inpatient Hospital Stay: Payer: BC Managed Care – PPO

## 2020-06-11 ENCOUNTER — Ambulatory Visit
Admission: RE | Admit: 2020-06-11 | Discharge: 2020-06-11 | Disposition: A | Discharge status: Home / Self Care | Payer: BC Managed Care – PPO | Source: Ambulatory Visit | Attending: Radiation Oncology | Admitting: Radiation Oncology

## 2020-06-11 ENCOUNTER — Inpatient Hospital Stay: Payer: BC Managed Care – PPO | Attending: Gynecologic Oncology

## 2020-06-11 DIAGNOSIS — Z79899 Other long term (current) drug therapy: Secondary | ICD-10-CM | POA: Diagnosis not present

## 2020-06-11 DIAGNOSIS — Z5111 Encounter for antineoplastic chemotherapy: Secondary | ICD-10-CM | POA: Insufficient documentation

## 2020-06-11 DIAGNOSIS — Z510 Encounter for antineoplastic radiation therapy: Secondary | ICD-10-CM | POA: Diagnosis not present

## 2020-06-11 DIAGNOSIS — Z7982 Long term (current) use of aspirin: Secondary | ICD-10-CM | POA: Diagnosis not present

## 2020-06-11 DIAGNOSIS — D61818 Other pancytopenia: Secondary | ICD-10-CM | POA: Insufficient documentation

## 2020-06-11 DIAGNOSIS — C539 Malignant neoplasm of cervix uteri, unspecified: Secondary | ICD-10-CM

## 2020-06-11 DIAGNOSIS — K2970 Gastritis, unspecified, without bleeding: Secondary | ICD-10-CM | POA: Diagnosis not present

## 2020-06-11 DIAGNOSIS — R110 Nausea: Secondary | ICD-10-CM | POA: Diagnosis not present

## 2020-06-11 DIAGNOSIS — T451X5A Adverse effect of antineoplastic and immunosuppressive drugs, initial encounter: Secondary | ICD-10-CM | POA: Diagnosis not present

## 2020-06-11 DIAGNOSIS — R197 Diarrhea, unspecified: Secondary | ICD-10-CM | POA: Insufficient documentation

## 2020-06-11 LAB — CBC WITH DIFFERENTIAL (CANCER CENTER ONLY)
Abs Immature Granulocytes: 0.01 10*3/uL (ref 0.00–0.07)
Basophils Absolute: 0 10*3/uL (ref 0.0–0.1)
Basophils Relative: 0 %
Eosinophils Absolute: 0 10*3/uL (ref 0.0–0.5)
Eosinophils Relative: 1 %
HCT: 43.9 % (ref 36.0–46.0)
Hemoglobin: 14.7 g/dL (ref 12.0–15.0)
Immature Granulocytes: 0 %
Lymphocytes Relative: 20 %
Lymphs Abs: 1.1 10*3/uL (ref 0.7–4.0)
MCH: 31.4 pg (ref 26.0–34.0)
MCHC: 33.5 g/dL (ref 30.0–36.0)
MCV: 93.8 fL (ref 80.0–100.0)
Monocytes Absolute: 0.4 10*3/uL (ref 0.1–1.0)
Monocytes Relative: 8 %
Neutro Abs: 3.9 10*3/uL (ref 1.7–7.7)
Neutrophils Relative %: 71 %
Platelet Count: 193 10*3/uL (ref 150–400)
RBC: 4.68 MIL/uL (ref 3.87–5.11)
RDW: 12.6 % (ref 11.5–15.5)
WBC Count: 5.5 10*3/uL (ref 4.0–10.5)
nRBC: 0 % (ref 0.0–0.2)

## 2020-06-11 LAB — BASIC METABOLIC PANEL - CANCER CENTER ONLY
Anion gap: 10 (ref 5–15)
BUN: 13 mg/dL (ref 6–20)
CO2: 24 mmol/L (ref 22–32)
Calcium: 9.4 mg/dL (ref 8.9–10.3)
Chloride: 102 mmol/L (ref 98–111)
Creatinine: 0.84 mg/dL (ref 0.44–1.00)
GFR, Est AFR Am: >60 mL/min (ref >60)
GFR, Estimated: >60 mL/min (ref >60)
Glucose, Bld: 102 mg/dL — ABNORMAL HIGH (ref 70–99)
Potassium: 4 mmol/L (ref 3.5–5.1)
Sodium: 136 mmol/L (ref 135–145)

## 2020-06-11 LAB — MAGNESIUM: Magnesium: 1.8 mg/dL (ref 1.7–2.4)

## 2020-06-11 MED ORDER — SODIUM CHLORIDE 0.9% FLUSH
10.0000 mL | Freq: Once | INTRAVENOUS | Status: AC
  Administered 2020-06-11: 10 mL
  Filled 2020-06-11: qty 10

## 2020-06-11 MED ORDER — HEPARIN SOD (PORK) LOCK FLUSH 100 UNIT/ML IV SOLN
500.0000 [IU] | Freq: Once | INTRAVENOUS | Status: AC
  Administered 2020-06-11: 500 [IU]
  Filled 2020-06-11: qty 5

## 2020-06-15 ENCOUNTER — Encounter: Payer: Self-pay | Admitting: Hematology and Oncology

## 2020-06-15 ENCOUNTER — Ambulatory Visit
Admission: RE | Admit: 2020-06-15 | Discharge: 2020-06-15 | Disposition: A | Discharge status: Home / Self Care | Payer: BC Managed Care – PPO | Source: Ambulatory Visit | Attending: Radiation Oncology | Admitting: Radiation Oncology

## 2020-06-15 ENCOUNTER — Other Ambulatory Visit: Payer: Self-pay

## 2020-06-15 ENCOUNTER — Other Ambulatory Visit: Payer: Self-pay | Admitting: Hematology and Oncology

## 2020-06-15 ENCOUNTER — Inpatient Hospital Stay (HOSPITAL_BASED_OUTPATIENT_CLINIC_OR_DEPARTMENT_OTHER): Payer: BC Managed Care – PPO | Admitting: Hematology and Oncology

## 2020-06-15 DIAGNOSIS — Z510 Encounter for antineoplastic radiation therapy: Secondary | ICD-10-CM | POA: Diagnosis not present

## 2020-06-15 DIAGNOSIS — K2900 Acute gastritis without bleeding: Secondary | ICD-10-CM | POA: Diagnosis not present

## 2020-06-15 DIAGNOSIS — Z79899 Other long term (current) drug therapy: Secondary | ICD-10-CM | POA: Diagnosis not present

## 2020-06-15 DIAGNOSIS — K2970 Gastritis, unspecified, without bleeding: Secondary | ICD-10-CM | POA: Diagnosis not present

## 2020-06-15 DIAGNOSIS — D61818 Other pancytopenia: Secondary | ICD-10-CM | POA: Diagnosis not present

## 2020-06-15 DIAGNOSIS — R110 Nausea: Secondary | ICD-10-CM | POA: Diagnosis not present

## 2020-06-15 DIAGNOSIS — Z5111 Encounter for antineoplastic chemotherapy: Secondary | ICD-10-CM | POA: Diagnosis not present

## 2020-06-15 DIAGNOSIS — Z7982 Long term (current) use of aspirin: Secondary | ICD-10-CM | POA: Diagnosis not present

## 2020-06-15 DIAGNOSIS — C539 Malignant neoplasm of cervix uteri, unspecified: Secondary | ICD-10-CM

## 2020-06-15 DIAGNOSIS — T451X5A Adverse effect of antineoplastic and immunosuppressive drugs, initial encounter: Secondary | ICD-10-CM | POA: Diagnosis not present

## 2020-06-15 DIAGNOSIS — R197 Diarrhea, unspecified: Secondary | ICD-10-CM | POA: Diagnosis not present

## 2020-06-15 NOTE — Progress Notes (Signed)
[place] OFFICE PROGRESS NOTE  Patient Care Team: [name], MD as PCP - General (Family Medicine) Awanda Mink Craige Cotta, RN as Oncology Nurse Navigator (Oncology)  ASSESSMENT & PLAN:  Cervical cancer [facility]) So far, she tolerated treatment well without major side effects except for occasional nausea We will continue treatment as scheduled  Chemotherapy-induced nausea We discussed the use of antiemetics as needed  Gastritis Her recent sensation of nausea could be due to gastritis, seen on recent PET CT scan If her symptoms are persistent, I will start her on proton pump inhibitor next week   No orders of the defined types were placed in this encounter.   All questions were answered. The patient knows to call the clinic with any problems, questions or concerns. The total time spent in the appointment was 20 minutes encounter with patients including review of chart and various tests results, discussions about plan of care and coordination of care plan   Heath Lark, MD 06/15/2020 12:37 PM  INTERVAL HISTORY: Please see below for problem oriented charting. She returns for further follow-up She tolerated treatment well last week except for very mild nausea/gastritis in her stomach region She took some antiemetics and that resolved Denies recent bleeding No recent constipation Denies peripheral neuropathy No hearing changes  SUMMARY OF ONCOLOGIC HISTORY: Oncology History  Cervical cancer ([place])  02/09/2020 Initial Diagnosis   Between 6-8 months ago, the patient endorses having irregular spotting intermittnetly every few weeks that would last for a few days. This did not change over time and she describes it as light spotting. This was the first bleeding she had after menopause. She ultimately saw her PCP who performed a pap in March which returned showing HSIL. The patient tells me today that this is the first pap test she has had ever. She was then referred to gyn and  underwent EMB/LEEP and transvaginal ultrasound for work-up of her PMB and high grade cervical dysplasia. Unfortunately, her pathology revealed SCC of the cervix   05/03/2020 Pathology Results   Endocervical curettings show fragments of squamous epithelium with high-grade squamous intraepithelial lesion, CIN-3 as well as fragments of invasive squamous cell carcinoma within the endometrial biopsy.  The invasive squamous cell carcinoma have depth of invasion more than 3 mm, with at least 10 mm, thickness almost 1.2 mm.   05/18/2020 Imaging   1. No findings of metastatic disease to the abdomen/pelvis. 2. There is a small amount of gas either along the cervical canal or the adjacent fornix. A well-defined mass is not seen. 3. Chronic bilateral pars defects at L5 with 7 mm grade 1 anterolisthesis of L5 on S1 and resulting mild left foraminal stenosis. 4. Suspected right foraminal disc protrusion at L3-4 possibly causing mild right foraminal impingement.     05/21/2020 Cancer Staging   Staging form: Cervix Uteri, AJCC Version 9 - Clinical stage from 05/21/2020: FIGO Stage IIB (cT2b, cM0) - Signed by Heath Lark, MD on 05/21/2020   05/26/2020 PET scan   1. Intensely hypermetabolic circumferential metabolic activity in the uterine cervix consistent with cervical carcinoma. 2. No evidence of metastatic disease in the pelvis. No metastatic lymphadenopathy. 3. No evidence of distant metastatic disease. 4. Focus of metabolic activity in the gastric antrum would be unlikely location for a cervical carcinoma metastasis. Favor gastritis or potentially primary gastric neoplasm. Consider upper endoscopy for further evaluation. 5. Diffuse activity in the thyroid gland is favored thyroiditis.   06/03/2020 Procedure   Status post right IJ port  catheter.   06/07/2020 -  Chemotherapy   The patient had weekly cisplatin for chemotherapy treatment.       REVIEW OF SYSTEMS:   Constitutional: Denies fevers, chills or  abnormal weight loss Eyes: Denies blurriness of vision Ears, nose, mouth, throat, and face: Denies mucositis or sore throat Respiratory: Denies cough, dyspnea or wheezes Cardiovascular: Denies palpitation, chest discomfort or lower extremity swelling Skin: Denies abnormal skin rashes Lymphatics: Denies new lymphadenopathy or easy bruising Neurological:Denies numbness, tingling or new weaknesses Behavioral/Psych: Mood is stable, no new changes  All other systems were reviewed with the patient and are negative.  I have reviewed the past medical history, past surgical history, social history and family history with the patient and they are unchanged from previous note.  ALLERGIES:  has No Known Allergies.  MEDICATIONS:  Current Outpatient Medications  Medication Sig Dispense Refill  . Acetaminophen (TYLENOL PO) Take by mouth. PRN    . aspirin EC 81 MG tablet Take 81 mg by mouth every 4 (four) hours as needed.    . diphenhydramine-acetaminophen (TYLENOL PM) 25-500 MG TABS tablet Take 1 tablet by mouth at bedtime as needed.    Marland Kitchen escitalopram (LEXAPRO) 5 MG tablet Take 5 mg by mouth at bedtime.    . lidocaine-prilocaine (EMLA) cream Apply to affected area once 30 g 3  . ondansetron (ZOFRAN) 8 MG tablet Take 1 tablet (8 mg total) by mouth every 8 (eight) hours as needed. 30 tablet 1  . prochlorperazine (COMPAZINE) 10 MG tablet Take 1 tablet (10 mg total) by mouth every 6 (six) hours as needed (Nausea or vomiting). 30 tablet 1   No current facility-administered medications for this visit.    PHYSICAL EXAMINATION: ECOG PERFORMANCE STATUS: 1 - Symptomatic but completely ambulatory  Vitals:   06/15/20 1232  BP: (!) 130/94  Pulse: 75  Resp: 18  Temp: 98.3 F (36.8 C)  SpO2: 100%   Filed Weights   06/15/20 1232  Weight: 221 lb (100.2 kg)    GENERAL:alert, no distress and comfortable SKIN: skin color, texture, turgor are normal, no rashes or significant lesions EYES: normal,  Conjunctiva are pink and non-injected, sclera clear OROPHARYNX:no exudate, no erythema and lips, buccal mucosa, and tongue normal  NECK: supple, thyroid normal size, non-tender, without nodularity LYMPH:  no palpable lymphadenopathy in the cervical, axillary or inguinal LUNGS: clear to auscultation and percussion with normal breathing effort HEART: regular rate & rhythm and no murmurs and no lower extremity edema ABDOMEN:abdomen soft, non-tender and normal bowel sounds Musculoskeletal:no cyanosis of digits and no clubbing  NEURO: alert & oriented x 3 with fluent speech, no focal motor/sensory deficits  LABORATORY DATA:  I have reviewed the data as listed    Component Value Date/Time   NA 136 06/11/2020 1257   K 4.0 06/11/2020 1257   CL 102 06/11/2020 1257   CO2 24 06/11/2020 1257   GLUCOSE 102 (H) 06/11/2020 1257   BUN 13 06/11/2020 1257   CREATININE 0.84 06/11/2020 1257   CALCIUM 9.4 06/11/2020 1257   PROT 7.3 06/03/2020 0822   ALBUMIN 4.7 06/03/2020 0822   AST 18 06/03/2020 0822   AST 20 05/14/2020 1211   ALT 21 06/03/2020 0822   ALT 31 05/14/2020 1211   ALKPHOS 63 06/03/2020 0822   BILITOT 0.8 06/03/2020 0822   BILITOT 0.7 05/14/2020 1211   GFRNONAA >60 06/11/2020 1257   GFRAA >60 06/11/2020 1257    No results found for: SPEP, UPEP  Lab Results  Component  Value Date   WBC 5.5 06/11/2020   NEUTROABS 3.9 06/11/2020   HGB 14.7 06/11/2020   HCT 43.9 06/11/2020   MCV 93.8 06/11/2020   PLT 193 06/11/2020      Chemistry      Component Value Date/Time   NA 136 06/11/2020 1257   K 4.0 06/11/2020 1257   CL 102 06/11/2020 1257   CO2 24 06/11/2020 1257   BUN 13 06/11/2020 1257   CREATININE 0.84 06/11/2020 1257      Component Value Date/Time   CALCIUM 9.4 06/11/2020 1257   ALKPHOS 63 06/03/2020 0822   AST 18 06/03/2020 0822   AST 20 05/14/2020 1211   ALT 21 06/03/2020 0822   ALT 31 05/14/2020 1211   BILITOT 0.8 06/03/2020 0822   BILITOT 0.7 05/14/2020 1211

## 2020-06-15 NOTE — Assessment & Plan Note (Signed)
So far, she tolerated treatment well without major side effects except for occasional nausea We will continue treatment as scheduled

## 2020-06-15 NOTE — Assessment & Plan Note (Signed)
Her recent sensation of nausea could be due to gastritis, seen on recent PET CT scan If her symptoms are persistent, I will start her on proton pump inhibitor next week

## 2020-06-15 NOTE — Assessment & Plan Note (Signed)
We discussed the use of antiemetics as needed

## 2020-06-16 ENCOUNTER — Ambulatory Visit
Admission: RE | Admit: 2020-06-16 | Discharge: 2020-06-16 | Disposition: A | Discharge status: Home / Self Care | Payer: BC Managed Care – PPO | Source: Ambulatory Visit | Attending: Radiation Oncology | Admitting: Radiation Oncology

## 2020-06-16 ENCOUNTER — Inpatient Hospital Stay: Payer: BC Managed Care – PPO

## 2020-06-16 ENCOUNTER — Other Ambulatory Visit: Payer: Self-pay

## 2020-06-16 VITALS — BP 124/81 | HR 64 | Temp 97.7°F | Resp 16

## 2020-06-16 DIAGNOSIS — K2970 Gastritis, unspecified, without bleeding: Secondary | ICD-10-CM | POA: Diagnosis not present

## 2020-06-16 DIAGNOSIS — Z7982 Long term (current) use of aspirin: Secondary | ICD-10-CM | POA: Diagnosis not present

## 2020-06-16 DIAGNOSIS — T451X5A Adverse effect of antineoplastic and immunosuppressive drugs, initial encounter: Secondary | ICD-10-CM | POA: Diagnosis not present

## 2020-06-16 DIAGNOSIS — D61818 Other pancytopenia: Secondary | ICD-10-CM | POA: Diagnosis not present

## 2020-06-16 DIAGNOSIS — Z510 Encounter for antineoplastic radiation therapy: Secondary | ICD-10-CM | POA: Diagnosis not present

## 2020-06-16 DIAGNOSIS — Z79899 Other long term (current) drug therapy: Secondary | ICD-10-CM | POA: Diagnosis not present

## 2020-06-16 DIAGNOSIS — Z5111 Encounter for antineoplastic chemotherapy: Secondary | ICD-10-CM | POA: Diagnosis not present

## 2020-06-16 DIAGNOSIS — R197 Diarrhea, unspecified: Secondary | ICD-10-CM | POA: Diagnosis not present

## 2020-06-16 DIAGNOSIS — C539 Malignant neoplasm of cervix uteri, unspecified: Secondary | ICD-10-CM | POA: Diagnosis not present

## 2020-06-16 DIAGNOSIS — R110 Nausea: Secondary | ICD-10-CM | POA: Diagnosis not present

## 2020-06-16 MED ORDER — SODIUM CHLORIDE 0.9 % IV SOLN
150.0000 mg | Freq: Once | INTRAVENOUS | Status: AC
  Administered 2020-06-16: 150 mg via INTRAVENOUS
  Filled 2020-06-16: qty 150

## 2020-06-16 MED ORDER — SODIUM CHLORIDE 0.9% FLUSH
10.0000 mL | INTRAVENOUS | Status: DC | PRN
  Administered 2020-06-16: 10 mL
  Filled 2020-06-16: qty 10

## 2020-06-16 MED ORDER — HEPARIN SOD (PORK) LOCK FLUSH 100 UNIT/ML IV SOLN
500.0000 [IU] | Freq: Once | INTRAVENOUS | Status: AC | PRN
  Administered 2020-06-16: 500 [IU]
  Filled 2020-06-16: qty 5

## 2020-06-16 MED ORDER — SODIUM CHLORIDE 0.9 % IV SOLN
10.0000 mg | Freq: Once | INTRAVENOUS | Status: AC
  Administered 2020-06-16: 10 mg via INTRAVENOUS
  Filled 2020-06-16: qty 10

## 2020-06-16 MED ORDER — PALONOSETRON HCL INJECTION 0.25 MG/5ML
0.2500 mg | Freq: Once | INTRAVENOUS | Status: AC
  Administered 2020-06-16: 0.25 mg via INTRAVENOUS

## 2020-06-16 MED ORDER — POTASSIUM CHLORIDE 2 MEQ/ML IV SOLN
Freq: Once | INTRAVENOUS | Status: AC
  Filled 2020-06-16: qty 10

## 2020-06-16 MED ORDER — PALONOSETRON HCL INJECTION 0.25 MG/5ML
INTRAVENOUS | Status: AC
  Filled 2020-06-16: qty 5

## 2020-06-16 MED ORDER — SODIUM CHLORIDE 0.9 % IV SOLN
Freq: Once | INTRAVENOUS | Status: AC
  Filled 2020-06-16: qty 250

## 2020-06-16 MED ORDER — SODIUM CHLORIDE 0.9 % IV SOLN
40.0000 mg/m2 | Freq: Once | INTRAVENOUS | Status: AC
  Administered 2020-06-16: 76 mg via INTRAVENOUS
  Filled 2020-06-16: qty 76

## 2020-06-16 NOTE — Patient Instructions (Signed)
[facility] Discharge Instructions for Patients Receiving Chemotherapy  Today you received the following chemotherapy agents Cisplatin  To help prevent nausea and vomiting after your treatment, we encourage you to take your nausea medication as directed  If you develop nausea and vomiting that is not controlled by your nausea medication, call the clinic.   BELOW ARE SYMPTOMS THAT SHOULD BE REPORTED IMMEDIATELY:  *FEVER GREATER THAN 100.5 F  *CHILLS WITH OR WITHOUT FEVER  NAUSEA AND VOMITING THAT IS NOT CONTROLLED WITH YOUR NAUSEA MEDICATION  *UNUSUAL SHORTNESS OF BREATH  *UNUSUAL BRUISING OR BLEEDING  TENDERNESS IN MOUTH AND THROAT WITH OR WITHOUT PRESENCE OF ULCERS  *URINARY PROBLEMS  *BOWEL PROBLEMS  UNUSUAL RASH Items with * indicate a potential emergency and should be followed up as soon as possible.  Feel free to call the clinic should you have any questions or concerns. The clinic phone number is [phone].  Please show the CHEMO ALERT CARD at check-in to the Emergency Department and triage nurse.   

## 2020-06-17 ENCOUNTER — Ambulatory Visit
Admission: RE | Admit: 2020-06-17 | Discharge: 2020-06-17 | Disposition: A | Discharge status: Home / Self Care | Payer: BC Managed Care – PPO | Source: Ambulatory Visit | Attending: Radiation Oncology | Admitting: Radiation Oncology

## 2020-06-17 ENCOUNTER — Other Ambulatory Visit: Payer: Self-pay

## 2020-06-17 DIAGNOSIS — Z510 Encounter for antineoplastic radiation therapy: Secondary | ICD-10-CM | POA: Diagnosis not present

## 2020-06-17 DIAGNOSIS — C539 Malignant neoplasm of cervix uteri, unspecified: Secondary | ICD-10-CM | POA: Diagnosis not present

## 2020-06-18 ENCOUNTER — Ambulatory Visit
Admission: RE | Admit: 2020-06-18 | Discharge: 2020-06-18 | Disposition: A | Discharge status: Home / Self Care | Payer: BC Managed Care – PPO | Source: Ambulatory Visit | Attending: Radiation Oncology | Admitting: Radiation Oncology

## 2020-06-18 ENCOUNTER — Other Ambulatory Visit: Payer: Self-pay

## 2020-06-18 DIAGNOSIS — C539 Malignant neoplasm of cervix uteri, unspecified: Secondary | ICD-10-CM | POA: Diagnosis not present

## 2020-06-18 DIAGNOSIS — Z510 Encounter for antineoplastic radiation therapy: Secondary | ICD-10-CM | POA: Diagnosis not present

## 2020-06-21 ENCOUNTER — Ambulatory Visit
Admission: RE | Admit: 2020-06-21 | Discharge: 2020-06-21 | Disposition: A | Discharge status: Home / Self Care | Payer: BC Managed Care – PPO | Source: Ambulatory Visit | Attending: Radiation Oncology | Admitting: Radiation Oncology

## 2020-06-21 ENCOUNTER — Inpatient Hospital Stay: Payer: BC Managed Care – PPO

## 2020-06-21 ENCOUNTER — Other Ambulatory Visit: Payer: Self-pay

## 2020-06-21 DIAGNOSIS — R197 Diarrhea, unspecified: Secondary | ICD-10-CM | POA: Diagnosis not present

## 2020-06-21 DIAGNOSIS — K2970 Gastritis, unspecified, without bleeding: Secondary | ICD-10-CM | POA: Diagnosis not present

## 2020-06-21 DIAGNOSIS — R110 Nausea: Secondary | ICD-10-CM | POA: Diagnosis not present

## 2020-06-21 DIAGNOSIS — C539 Malignant neoplasm of cervix uteri, unspecified: Secondary | ICD-10-CM

## 2020-06-21 DIAGNOSIS — Z5111 Encounter for antineoplastic chemotherapy: Secondary | ICD-10-CM | POA: Diagnosis not present

## 2020-06-21 DIAGNOSIS — Z79899 Other long term (current) drug therapy: Secondary | ICD-10-CM | POA: Diagnosis not present

## 2020-06-21 DIAGNOSIS — Z510 Encounter for antineoplastic radiation therapy: Secondary | ICD-10-CM | POA: Diagnosis not present

## 2020-06-21 DIAGNOSIS — T451X5A Adverse effect of antineoplastic and immunosuppressive drugs, initial encounter: Secondary | ICD-10-CM | POA: Diagnosis not present

## 2020-06-21 DIAGNOSIS — D61818 Other pancytopenia: Secondary | ICD-10-CM | POA: Diagnosis not present

## 2020-06-21 DIAGNOSIS — Z7982 Long term (current) use of aspirin: Secondary | ICD-10-CM | POA: Diagnosis not present

## 2020-06-21 LAB — BASIC METABOLIC PANEL - CANCER CENTER ONLY
Anion gap: 9 (ref 5–15)
BUN: 15 mg/dL (ref 6–20)
CO2: 24 mmol/L (ref 22–32)
Calcium: 9.3 mg/dL (ref 8.9–10.3)
Chloride: 104 mmol/L (ref 98–111)
Creatinine: 0.75 mg/dL (ref 0.44–1.00)
GFR, Est AFR Am: >60 mL/min (ref >60)
GFR, Estimated: >60 mL/min (ref >60)
Glucose, Bld: 87 mg/dL (ref 70–99)
Potassium: 4.2 mmol/L (ref 3.5–5.1)
Sodium: 137 mmol/L (ref 135–145)

## 2020-06-21 LAB — CBC WITH DIFFERENTIAL (CANCER CENTER ONLY)
Abs Immature Granulocytes: 0.01 10*3/uL (ref 0.00–0.07)
Basophils Absolute: 0 10*3/uL (ref 0.0–0.1)
Basophils Relative: 1 %
Eosinophils Absolute: 0.2 10*3/uL (ref 0.0–0.5)
Eosinophils Relative: 5 %
HCT: 40.9 % (ref 36.0–46.0)
Hemoglobin: 13.7 g/dL (ref 12.0–15.0)
Immature Granulocytes: 0 %
Lymphocytes Relative: 21 %
Lymphs Abs: 0.8 10*3/uL (ref 0.7–4.0)
MCH: 31.6 pg (ref 26.0–34.0)
MCHC: 33.5 g/dL (ref 30.0–36.0)
MCV: 94.5 fL (ref 80.0–100.0)
Monocytes Absolute: 0.4 10*3/uL (ref 0.1–1.0)
Monocytes Relative: 11 %
Neutro Abs: 2.3 10*3/uL (ref 1.7–7.7)
Neutrophils Relative %: 62 %
Platelet Count: 132 10*3/uL — ABNORMAL LOW (ref 150–400)
RBC: 4.33 MIL/uL (ref 3.87–5.11)
RDW: 12.7 % (ref 11.5–15.5)
WBC Count: 3.7 10*3/uL — ABNORMAL LOW (ref 4.0–10.5)
nRBC: 0 % (ref 0.0–0.2)

## 2020-06-21 LAB — MAGNESIUM: Magnesium: 1.9 mg/dL (ref 1.7–2.4)

## 2020-06-21 MED ORDER — SODIUM CHLORIDE 0.9% FLUSH
10.0000 mL | Freq: Once | INTRAVENOUS | Status: AC
  Administered 2020-06-21: 10 mL
  Filled 2020-06-21: qty 10

## 2020-06-21 MED ORDER — HEPARIN SOD (PORK) LOCK FLUSH 100 UNIT/ML IV SOLN
500.0000 [IU] | Freq: Once | INTRAVENOUS | Status: AC
  Administered 2020-06-21: 500 [IU]
  Filled 2020-06-21: qty 5

## 2020-06-21 NOTE — Patient Instructions (Signed)

## 2020-06-22 ENCOUNTER — Inpatient Hospital Stay (HOSPITAL_BASED_OUTPATIENT_CLINIC_OR_DEPARTMENT_OTHER): Payer: BC Managed Care – PPO | Admitting: Hematology and Oncology

## 2020-06-22 ENCOUNTER — Encounter: Payer: Self-pay | Admitting: Hematology and Oncology

## 2020-06-22 ENCOUNTER — Other Ambulatory Visit: Payer: Self-pay

## 2020-06-22 ENCOUNTER — Ambulatory Visit
Admission: RE | Admit: 2020-06-22 | Discharge: 2020-06-22 | Disposition: A | Discharge status: Home / Self Care | Payer: BC Managed Care – PPO | Source: Ambulatory Visit | Attending: Radiation Oncology | Admitting: Radiation Oncology

## 2020-06-22 DIAGNOSIS — C539 Malignant neoplasm of cervix uteri, unspecified: Secondary | ICD-10-CM | POA: Diagnosis not present

## 2020-06-22 DIAGNOSIS — T451X5A Adverse effect of antineoplastic and immunosuppressive drugs, initial encounter: Secondary | ICD-10-CM | POA: Diagnosis not present

## 2020-06-22 DIAGNOSIS — R197 Diarrhea, unspecified: Secondary | ICD-10-CM

## 2020-06-22 DIAGNOSIS — Z5111 Encounter for antineoplastic chemotherapy: Secondary | ICD-10-CM | POA: Diagnosis not present

## 2020-06-22 DIAGNOSIS — K2970 Gastritis, unspecified, without bleeding: Secondary | ICD-10-CM | POA: Diagnosis not present

## 2020-06-22 DIAGNOSIS — Z7982 Long term (current) use of aspirin: Secondary | ICD-10-CM | POA: Diagnosis not present

## 2020-06-22 DIAGNOSIS — R110 Nausea: Secondary | ICD-10-CM | POA: Diagnosis not present

## 2020-06-22 DIAGNOSIS — D61818 Other pancytopenia: Secondary | ICD-10-CM

## 2020-06-22 DIAGNOSIS — Z510 Encounter for antineoplastic radiation therapy: Secondary | ICD-10-CM | POA: Diagnosis not present

## 2020-06-22 DIAGNOSIS — Z79899 Other long term (current) drug therapy: Secondary | ICD-10-CM | POA: Diagnosis not present

## 2020-06-22 NOTE — Assessment & Plan Note (Signed)
So far, she tolerated treatment well apart from some mild pancytopenia and diarrhea She will continue treatment as scheduled I will see her weekly for further follow-up

## 2020-06-22 NOTE — Progress Notes (Signed)
[place] OFFICE PROGRESS NOTE  Patient Care Team: [name], MD as PCP - General (Family Medicine) Awanda Mink Craige Cotta, RN as Oncology Nurse Navigator (Oncology)  ASSESSMENT & PLAN:  Cervical cancer [facility]) So far, she tolerated treatment well apart from some mild pancytopenia and diarrhea She will continue treatment as scheduled I will see her weekly for further follow-up  Diarrhea She has new onset diarrhea likely secondary to side effects of treatment We discussed the importance of adequate hydration and to take Imodium as needed  Pancytopenia, acquired [facility]) She has mild acquired pancytopenia likely secondary to side effects of treatment She is not symptomatic Observe for now   No orders of the defined types were placed in this encounter.   All questions were answered. The patient knows to call the clinic with any problems, questions or concerns. The total time spent in the appointment was 20 minutes encounter with patients including review of chart and various tests results, discussions about plan of care and coordination of care plan   Heath Lark, MD 06/22/2020 12:22 PM  INTERVAL HISTORY: Please see below for problem oriented charting. She returns for further follow-up She tolerated chemo very well so far Denies nausea She now has new onset diarrhea, average about 3 bowel movement per day She has taken some Imodium No recent infection, fever or chills She has sporadic vaginal spotting Denies peripheral neuropathy from treatment  SUMMARY OF ONCOLOGIC HISTORY: Oncology History  Cervical cancer ([place])  02/09/2020 Initial Diagnosis   Between 6-8 months ago, the patient endorses having irregular spotting intermittnetly every few weeks that would last for a few days. This did not change over time and she describes it as light spotting. This was the first bleeding she had after menopause. She ultimately saw her PCP who performed a pap in March which returned  showing HSIL. The patient tells me today that this is the first pap test she has had ever. She was then referred to gyn and underwent EMB/LEEP and transvaginal ultrasound for work-up of her PMB and high grade cervical dysplasia. Unfortunately, her pathology revealed SCC of the cervix   05/03/2020 Pathology Results   Endocervical curettings show fragments of squamous epithelium with high-grade squamous intraepithelial lesion, CIN-3 as well as fragments of invasive squamous cell carcinoma within the endometrial biopsy.  The invasive squamous cell carcinoma have depth of invasion more than 3 mm, with at least 10 mm, thickness almost 1.2 mm.   05/18/2020 Imaging   1. No findings of metastatic disease to the abdomen/pelvis. 2. There is a small amount of gas either along the cervical canal or the adjacent fornix. A well-defined mass is not seen. 3. Chronic bilateral pars defects at L5 with 7 mm grade 1 anterolisthesis of L5 on S1 and resulting mild left foraminal stenosis. 4. Suspected right foraminal disc protrusion at L3-4 possibly causing mild right foraminal impingement.     05/21/2020 Cancer Staging   Staging form: Cervix Uteri, AJCC Version 9 - Clinical stage from 05/21/2020: FIGO Stage IIB (cT2b, cM0) - Signed by Heath Lark, MD on 05/21/2020   05/26/2020 PET scan   1. Intensely hypermetabolic circumferential metabolic activity in the uterine cervix consistent with cervical carcinoma. 2. No evidence of metastatic disease in the pelvis. No metastatic lymphadenopathy. 3. No evidence of distant metastatic disease. 4. Focus of metabolic activity in the gastric antrum would be unlikely location for a cervical carcinoma metastasis. Favor gastritis or potentially primary gastric neoplasm. Consider upper endoscopy for further  evaluation. 5. Diffuse activity in the thyroid gland is favored thyroiditis.   06/03/2020 Procedure   Status post right IJ port catheter.   06/07/2020 -  Chemotherapy   The patient  had weekly cisplatin for chemotherapy treatment.       REVIEW OF SYSTEMS:   Constitutional: Denies fevers, chills or abnormal weight loss Eyes: Denies blurriness of vision Ears, nose, mouth, throat, and face: Denies mucositis or sore throat Respiratory: Denies cough, dyspnea or wheezes Cardiovascular: Denies palpitation, chest discomfort or lower extremity swelling Skin: Denies abnormal skin rashes Lymphatics: Denies new lymphadenopathy or easy bruising Neurological:Denies numbness, tingling or new weaknesses Behavioral/Psych: Mood is stable, no new changes  All other systems were reviewed with the patient and are negative.  I have reviewed the past medical history, past surgical history, social history and family history with the patient and they are unchanged from previous note.  ALLERGIES:  has No Known Allergies.  MEDICATIONS:  Current Outpatient Medications  Medication Sig Dispense Refill  . Acetaminophen (TYLENOL PO) Take by mouth. PRN    . aspirin EC 81 MG tablet Take 81 mg by mouth every 4 (four) hours as needed.    . diphenhydramine-acetaminophen (TYLENOL PM) 25-500 MG TABS tablet Take 1 tablet by mouth at bedtime as needed.    Marland Kitchen escitalopram (LEXAPRO) 5 MG tablet Take 5 mg by mouth at bedtime.    . lidocaine-prilocaine (EMLA) cream Apply to affected area once 30 g 3  . ondansetron (ZOFRAN) 8 MG tablet Take 1 tablet (8 mg total) by mouth every 8 (eight) hours as needed. 30 tablet 1  . prochlorperazine (COMPAZINE) 10 MG tablet Take 1 tablet (10 mg total) by mouth every 6 (six) hours as needed (Nausea or vomiting). 30 tablet 1   No current facility-administered medications for this visit.    PHYSICAL EXAMINATION: ECOG PERFORMANCE STATUS: 1 - Symptomatic but completely ambulatory  Vitals:   06/22/20 1201  BP: 130/80  Pulse: 80  Resp: 18  Temp: 98.3 F (36.8 C)  SpO2: 100%   Filed Weights   06/22/20 1201  Weight: 221 lb (100.2 kg)    GENERAL:alert, no distress  and comfortable SKIN: skin color, texture, turgor are normal, no rashes or significant lesions EYES: normal, Conjunctiva are pink and non-injected, sclera clear OROPHARYNX:no exudate, no erythema and lips, buccal mucosa, and tongue normal  NECK: supple, thyroid normal size, non-tender, without nodularity LYMPH:  no palpable lymphadenopathy in the cervical, axillary or inguinal LUNGS: clear to auscultation and percussion with normal breathing effort HEART: regular rate & rhythm and no murmurs and no lower extremity edema ABDOMEN:abdomen soft, non-tender and normal bowel sounds Musculoskeletal:no cyanosis of digits and no clubbing  NEURO: alert & oriented x 3 with fluent speech, no focal motor/sensory deficits  LABORATORY DATA:  I have reviewed the data as listed    Component Value Date/Time   NA 137 06/21/2020 1205   K 4.2 06/21/2020 1205   CL 104 06/21/2020 1205   CO2 24 06/21/2020 1205   GLUCOSE 87 06/21/2020 1205   BUN 15 06/21/2020 1205   CREATININE 0.75 06/21/2020 1205   CALCIUM 9.3 06/21/2020 1205   PROT 7.3 06/03/2020 0822   ALBUMIN 4.7 06/03/2020 0822   AST 18 06/03/2020 0822   AST 20 05/14/2020 1211   ALT 21 06/03/2020 0822   ALT 31 05/14/2020 1211   ALKPHOS 63 06/03/2020 0822   BILITOT 0.8 06/03/2020 0822   BILITOT 0.7 05/14/2020 1211   GFRNONAA >60 06/21/2020 1205  GFRAA >60 06/21/2020 1205    No results found for: SPEP, UPEP  Lab Results  Component Value Date   WBC 3.7 (L) 06/21/2020   NEUTROABS 2.3 06/21/2020   HGB 13.7 06/21/2020   HCT 40.9 06/21/2020   MCV 94.5 06/21/2020   PLT 132 (L) 06/21/2020      Chemistry      Component Value Date/Time   NA 137 06/21/2020 1205   K 4.2 06/21/2020 1205   CL 104 06/21/2020 1205   CO2 24 06/21/2020 1205   BUN 15 06/21/2020 1205   CREATININE 0.75 06/21/2020 1205      Component Value Date/Time   CALCIUM 9.3 06/21/2020 1205   ALKPHOS 63 06/03/2020 0822   AST 18 06/03/2020 0822   AST 20 05/14/2020 1211    ALT 21 06/03/2020 0822   ALT 31 05/14/2020 1211   BILITOT 0.8 06/03/2020 0822   BILITOT 0.7 05/14/2020 1211

## 2020-06-22 NOTE — Assessment & Plan Note (Signed)
She has new onset diarrhea likely secondary to side effects of treatment We discussed the importance of adequate hydration and to take Imodium as needed

## 2020-06-22 NOTE — Assessment & Plan Note (Signed)
She has mild acquired pancytopenia likely secondary to side effects of treatment She is not symptomatic Observe for now

## 2020-06-23 ENCOUNTER — Other Ambulatory Visit: Payer: Self-pay

## 2020-06-23 ENCOUNTER — Inpatient Hospital Stay: Payer: BC Managed Care – PPO

## 2020-06-23 ENCOUNTER — Ambulatory Visit
Admission: RE | Admit: 2020-06-23 | Discharge: 2020-06-23 | Disposition: A | Discharge status: Home / Self Care | Payer: BC Managed Care – PPO | Source: Ambulatory Visit | Attending: Radiation Oncology | Admitting: Radiation Oncology

## 2020-06-23 VITALS — BP 106/51 | HR 79 | Temp 98.5°F | Resp 18

## 2020-06-23 DIAGNOSIS — R197 Diarrhea, unspecified: Secondary | ICD-10-CM | POA: Diagnosis not present

## 2020-06-23 DIAGNOSIS — K2970 Gastritis, unspecified, without bleeding: Secondary | ICD-10-CM | POA: Diagnosis not present

## 2020-06-23 DIAGNOSIS — C539 Malignant neoplasm of cervix uteri, unspecified: Secondary | ICD-10-CM

## 2020-06-23 DIAGNOSIS — R110 Nausea: Secondary | ICD-10-CM | POA: Diagnosis not present

## 2020-06-23 DIAGNOSIS — Z79899 Other long term (current) drug therapy: Secondary | ICD-10-CM | POA: Diagnosis not present

## 2020-06-23 DIAGNOSIS — Z510 Encounter for antineoplastic radiation therapy: Secondary | ICD-10-CM | POA: Diagnosis not present

## 2020-06-23 DIAGNOSIS — Z7982 Long term (current) use of aspirin: Secondary | ICD-10-CM | POA: Diagnosis not present

## 2020-06-23 DIAGNOSIS — T451X5A Adverse effect of antineoplastic and immunosuppressive drugs, initial encounter: Secondary | ICD-10-CM | POA: Diagnosis not present

## 2020-06-23 DIAGNOSIS — Z5111 Encounter for antineoplastic chemotherapy: Secondary | ICD-10-CM | POA: Diagnosis not present

## 2020-06-23 DIAGNOSIS — D61818 Other pancytopenia: Secondary | ICD-10-CM | POA: Diagnosis not present

## 2020-06-23 MED ORDER — SODIUM CHLORIDE 0.9 % IV SOLN
10.0000 mg | Freq: Once | INTRAVENOUS | Status: AC
  Administered 2020-06-23: 10 mg via INTRAVENOUS
  Filled 2020-06-23: qty 10

## 2020-06-23 MED ORDER — SODIUM CHLORIDE 0.9 % IV SOLN
40.0000 mg/m2 | Freq: Once | INTRAVENOUS | Status: AC
  Administered 2020-06-23: 76 mg via INTRAVENOUS
  Filled 2020-06-23: qty 76

## 2020-06-23 MED ORDER — SODIUM CHLORIDE 0.9 % IV SOLN
Freq: Once | INTRAVENOUS | Status: AC
  Filled 2020-06-23: qty 250

## 2020-06-23 MED ORDER — SODIUM CHLORIDE 0.9% FLUSH
10.0000 mL | INTRAVENOUS | Status: DC | PRN
  Administered 2020-06-23: 10 mL
  Filled 2020-06-23: qty 10

## 2020-06-23 MED ORDER — POTASSIUM CHLORIDE 2 MEQ/ML IV SOLN
Freq: Once | INTRAVENOUS | Status: AC
  Filled 2020-06-23: qty 10

## 2020-06-23 MED ORDER — SODIUM CHLORIDE 0.9 % IV SOLN
150.0000 mg | Freq: Once | INTRAVENOUS | Status: AC
  Administered 2020-06-23: 150 mg via INTRAVENOUS
  Filled 2020-06-23: qty 150

## 2020-06-23 MED ORDER — PALONOSETRON HCL INJECTION 0.25 MG/5ML
0.2500 mg | Freq: Once | INTRAVENOUS | Status: AC
  Administered 2020-06-23: 0.25 mg via INTRAVENOUS

## 2020-06-23 MED ORDER — PALONOSETRON HCL INJECTION 0.25 MG/5ML
INTRAVENOUS | Status: AC
  Filled 2020-06-23: qty 5

## 2020-06-23 MED ORDER — HEPARIN SOD (PORK) LOCK FLUSH 100 UNIT/ML IV SOLN
500.0000 [IU] | Freq: Once | INTRAVENOUS | Status: AC | PRN
  Administered 2020-06-23: 500 [IU]
  Filled 2020-06-23: qty 5

## 2020-06-24 ENCOUNTER — Ambulatory Visit
Admission: RE | Admit: 2020-06-24 | Discharge: 2020-06-24 | Disposition: A | Discharge status: Home / Self Care | Payer: BC Managed Care – PPO | Source: Ambulatory Visit | Attending: Radiation Oncology | Admitting: Radiation Oncology

## 2020-06-24 ENCOUNTER — Other Ambulatory Visit: Payer: Self-pay

## 2020-06-24 DIAGNOSIS — C539 Malignant neoplasm of cervix uteri, unspecified: Secondary | ICD-10-CM | POA: Diagnosis not present

## 2020-06-24 DIAGNOSIS — Z510 Encounter for antineoplastic radiation therapy: Secondary | ICD-10-CM | POA: Diagnosis not present

## 2020-06-25 ENCOUNTER — Other Ambulatory Visit: Payer: Self-pay

## 2020-06-25 ENCOUNTER — Ambulatory Visit
Admission: RE | Admit: 2020-06-25 | Discharge: 2020-06-25 | Disposition: A | Discharge status: Home / Self Care | Payer: BC Managed Care – PPO | Source: Ambulatory Visit | Attending: Radiation Oncology | Admitting: Radiation Oncology

## 2020-06-25 DIAGNOSIS — C539 Malignant neoplasm of cervix uteri, unspecified: Secondary | ICD-10-CM | POA: Diagnosis not present

## 2020-06-25 DIAGNOSIS — Z510 Encounter for antineoplastic radiation therapy: Secondary | ICD-10-CM | POA: Diagnosis not present

## 2020-06-28 ENCOUNTER — Ambulatory Visit
Admission: RE | Admit: 2020-06-28 | Discharge: 2020-06-28 | Disposition: A | Discharge status: Home / Self Care | Payer: BC Managed Care – PPO | Source: Ambulatory Visit | Attending: Radiation Oncology | Admitting: Radiation Oncology

## 2020-06-28 ENCOUNTER — Other Ambulatory Visit: Payer: Self-pay

## 2020-06-28 ENCOUNTER — Inpatient Hospital Stay: Payer: BC Managed Care – PPO

## 2020-06-28 ENCOUNTER — Telehealth: Payer: Self-pay | Admitting: Oncology

## 2020-06-28 DIAGNOSIS — Z510 Encounter for antineoplastic radiation therapy: Secondary | ICD-10-CM | POA: Diagnosis not present

## 2020-06-28 DIAGNOSIS — Z5111 Encounter for antineoplastic chemotherapy: Secondary | ICD-10-CM | POA: Diagnosis not present

## 2020-06-28 DIAGNOSIS — C539 Malignant neoplasm of cervix uteri, unspecified: Secondary | ICD-10-CM | POA: Diagnosis not present

## 2020-06-28 DIAGNOSIS — D61818 Other pancytopenia: Secondary | ICD-10-CM | POA: Diagnosis not present

## 2020-06-28 DIAGNOSIS — Z7982 Long term (current) use of aspirin: Secondary | ICD-10-CM | POA: Diagnosis not present

## 2020-06-28 DIAGNOSIS — T451X5A Adverse effect of antineoplastic and immunosuppressive drugs, initial encounter: Secondary | ICD-10-CM | POA: Diagnosis not present

## 2020-06-28 DIAGNOSIS — R110 Nausea: Secondary | ICD-10-CM | POA: Diagnosis not present

## 2020-06-28 DIAGNOSIS — R197 Diarrhea, unspecified: Secondary | ICD-10-CM | POA: Diagnosis not present

## 2020-06-28 DIAGNOSIS — Z79899 Other long term (current) drug therapy: Secondary | ICD-10-CM | POA: Diagnosis not present

## 2020-06-28 DIAGNOSIS — K2970 Gastritis, unspecified, without bleeding: Secondary | ICD-10-CM | POA: Diagnosis not present

## 2020-06-28 LAB — BASIC METABOLIC PANEL - CANCER CENTER ONLY
Anion gap: 9 (ref 5–15)
BUN: 19 mg/dL (ref 6–20)
CO2: 25 mmol/L (ref 22–32)
Calcium: 9.5 mg/dL (ref 8.9–10.3)
Chloride: 104 mmol/L (ref 98–111)
Creatinine: 0.81 mg/dL (ref 0.44–1.00)
GFR, Est AFR Am: >60 mL/min (ref >60)
GFR, Estimated: >60 mL/min (ref >60)
Glucose, Bld: 126 mg/dL — ABNORMAL HIGH (ref 70–99)
Potassium: 3.8 mmol/L (ref 3.5–5.1)
Sodium: 138 mmol/L (ref 135–145)

## 2020-06-28 LAB — CBC WITH DIFFERENTIAL (CANCER CENTER ONLY)
Abs Immature Granulocytes: 0.01 10*3/uL (ref 0.00–0.07)
Basophils Absolute: 0 10*3/uL (ref 0.0–0.1)
Basophils Relative: 1 %
Eosinophils Absolute: 0.2 10*3/uL (ref 0.0–0.5)
Eosinophils Relative: 5 %
HCT: 39 % (ref 36.0–46.0)
Hemoglobin: 13.3 g/dL (ref 12.0–15.0)
Immature Granulocytes: 0 %
Lymphocytes Relative: 14 %
Lymphs Abs: 0.5 10*3/uL — ABNORMAL LOW (ref 0.7–4.0)
MCH: 31.6 pg (ref 26.0–34.0)
MCHC: 34.1 g/dL (ref 30.0–36.0)
MCV: 92.6 fL (ref 80.0–100.0)
Monocytes Absolute: 0.3 10*3/uL (ref 0.1–1.0)
Monocytes Relative: 9 %
Neutro Abs: 2.7 10*3/uL (ref 1.7–7.7)
Neutrophils Relative %: 71 %
Platelet Count: 142 10*3/uL — ABNORMAL LOW (ref 150–400)
RBC: 4.21 MIL/uL (ref 3.87–5.11)
RDW: 12.6 % (ref 11.5–15.5)
WBC Count: 3.8 10*3/uL — ABNORMAL LOW (ref 4.0–10.5)
nRBC: 0 % (ref 0.0–0.2)

## 2020-06-28 LAB — MAGNESIUM: Magnesium: 1.7 mg/dL (ref 1.7–2.4)

## 2020-06-28 MED ORDER — HEPARIN SOD (PORK) LOCK FLUSH 100 UNIT/ML IV SOLN
250.0000 [IU] | Freq: Once | INTRAVENOUS | Status: AC
  Administered 2020-06-28: 500 [IU]
  Filled 2020-06-28: qty 5

## 2020-06-28 MED ORDER — SODIUM CHLORIDE 0.9% FLUSH
10.0000 mL | Freq: Once | INTRAVENOUS | Status: AC
  Administered 2020-06-28: 10 mL
  Filled 2020-06-28: qty 10

## 2020-06-28 NOTE — Patient Instructions (Signed)

## 2020-06-28 NOTE — Telephone Encounter (Signed)
Attempted to call [employer] was unable to speak to anyone or leave a message.

## 2020-06-28 NOTE — Telephone Encounter (Signed)
Shyonna called and asked if [employer] short term disability has tried to contact us.  She said they are not paying her short term disability because they need to find out if she has preexisting condition.  She said we can call them at [phone] and her claim number [id number].  Advised her that I will check with our FMLA nurse and call them.

## 2020-06-29 ENCOUNTER — Encounter: Payer: Self-pay | Admitting: Hematology and Oncology

## 2020-06-29 ENCOUNTER — Telehealth: Payer: Self-pay | Admitting: Hematology and Oncology

## 2020-06-29 ENCOUNTER — Inpatient Hospital Stay (HOSPITAL_BASED_OUTPATIENT_CLINIC_OR_DEPARTMENT_OTHER): Payer: BC Managed Care – PPO | Admitting: Hematology and Oncology

## 2020-06-29 ENCOUNTER — Telehealth: Payer: Self-pay | Admitting: Oncology

## 2020-06-29 ENCOUNTER — Other Ambulatory Visit: Payer: Self-pay

## 2020-06-29 ENCOUNTER — Ambulatory Visit
Admission: RE | Admit: 2020-06-29 | Discharge: 2020-06-29 | Disposition: A | Discharge status: Home / Self Care | Payer: BC Managed Care – PPO | Source: Ambulatory Visit | Attending: Radiation Oncology | Admitting: Radiation Oncology

## 2020-06-29 DIAGNOSIS — C539 Malignant neoplasm of cervix uteri, unspecified: Secondary | ICD-10-CM | POA: Diagnosis not present

## 2020-06-29 DIAGNOSIS — R197 Diarrhea, unspecified: Secondary | ICD-10-CM | POA: Diagnosis not present

## 2020-06-29 DIAGNOSIS — R110 Nausea: Secondary | ICD-10-CM | POA: Diagnosis not present

## 2020-06-29 DIAGNOSIS — K2970 Gastritis, unspecified, without bleeding: Secondary | ICD-10-CM | POA: Diagnosis not present

## 2020-06-29 DIAGNOSIS — Z5111 Encounter for antineoplastic chemotherapy: Secondary | ICD-10-CM | POA: Diagnosis not present

## 2020-06-29 DIAGNOSIS — Z7982 Long term (current) use of aspirin: Secondary | ICD-10-CM | POA: Diagnosis not present

## 2020-06-29 DIAGNOSIS — Z510 Encounter for antineoplastic radiation therapy: Secondary | ICD-10-CM | POA: Diagnosis not present

## 2020-06-29 DIAGNOSIS — T451X5A Adverse effect of antineoplastic and immunosuppressive drugs, initial encounter: Secondary | ICD-10-CM

## 2020-06-29 DIAGNOSIS — D61818 Other pancytopenia: Secondary | ICD-10-CM | POA: Diagnosis not present

## 2020-06-29 DIAGNOSIS — Z79899 Other long term (current) drug therapy: Secondary | ICD-10-CM | POA: Diagnosis not present

## 2020-06-29 NOTE — Progress Notes (Signed)
[facility] OFFICE PROGRESS NOTE  Patient Care Team: [name], MD as PCP - General (Family Medicine) Awanda Mink Craige Cotta, RN as Oncology Nurse Navigator (Oncology)  ASSESSMENT & PLAN:  Cervical cancer [facility]) So far, she tolerated treatment well apart from some mild pancytopenia and diarrhea She will continue treatment as scheduled I will see her weekly for further follow-up  Pancytopenia, acquired [facility]) She has mild acquired pancytopenia likely secondary to side effects of treatment She is not symptomatic Observe for now  Chemotherapy-induced nausea We discussed the use of antiemetics as needed  Diarrhea She has new onset mild diarrhea likely secondary to side effects of treatment We discussed the importance of adequate hydration and to take Imodium as needed   No orders of the defined types were placed in this encounter.   All questions were answered. The patient knows to call the clinic with any problems, questions or concerns. The total time spent in the appointment was 20 minutes encounter with patients including review of chart and various tests results, discussions about plan of care and coordination of care plan   Heath Lark, MD 06/29/2020 12:53 PM  INTERVAL HISTORY: Please see below for problem oriented charting. She is seen prior to cycle 4 of treatment She tolerated treatment very well except for mild intermittent nausea and some loose stool Denies neuropathy or hearing changes No recent infection, fever or chills Her vaginal bleeding has stopped  SUMMARY OF ONCOLOGIC HISTORY: Oncology History  Cervical cancer ([place])  02/09/2020 Initial Diagnosis   Between 6-8 months ago, the patient endorses having irregular spotting intermittnetly every few weeks that would last for a few days. This did not change over time and she describes it as light spotting. This was the first bleeding she had after menopause. She ultimately saw her PCP who performed a pap  in March which returned showing HSIL. The patient tells me today that this is the first pap test she has had ever. She was then referred to gyn and underwent EMB/LEEP and transvaginal ultrasound for work-up of her PMB and high grade cervical dysplasia. Unfortunately, her pathology revealed SCC of the cervix   05/03/2020 Pathology Results   Endocervical curettings show fragments of squamous epithelium with high-grade squamous intraepithelial lesion, CIN-3 as well as fragments of invasive squamous cell carcinoma within the endometrial biopsy.  The invasive squamous cell carcinoma have depth of invasion more than 3 mm, with at least 10 mm, thickness almost 1.2 mm.   05/18/2020 Imaging   1. No findings of metastatic disease to the abdomen/pelvis. 2. There is a small amount of gas either along the cervical canal or the adjacent fornix. A well-defined mass is not seen. 3. Chronic bilateral pars defects at L5 with 7 mm grade 1 anterolisthesis of L5 on S1 and resulting mild left foraminal stenosis. 4. Suspected right foraminal disc protrusion at L3-4 possibly causing mild right foraminal impingement.     05/21/2020 Cancer Staging   Staging form: Cervix Uteri, AJCC Version 9 - Clinical stage from 05/21/2020: FIGO Stage IIB (cT2b, cM0) - Signed by Heath Lark, MD on 05/21/2020   05/26/2020 PET scan   1. Intensely hypermetabolic circumferential metabolic activity in the uterine cervix consistent with cervical carcinoma. 2. No evidence of metastatic disease in the pelvis. No metastatic lymphadenopathy. 3. No evidence of distant metastatic disease. 4. Focus of metabolic activity in the gastric antrum would be unlikely location for a cervical carcinoma metastasis. Favor gastritis or potentially primary gastric neoplasm. Consider upper  endoscopy for further evaluation. 5. Diffuse activity in the thyroid gland is favored thyroiditis.   06/03/2020 Procedure   Status post right IJ port catheter.   06/07/2020 -   Chemotherapy   The patient had weekly cisplatin for chemotherapy treatment.       REVIEW OF SYSTEMS:   Constitutional: Denies fevers, chills or abnormal weight loss Eyes: Denies blurriness of vision Ears, nose, mouth, throat, and face: Denies mucositis or sore throat Respiratory: Denies cough, dyspnea or wheezes Cardiovascular: Denies palpitation, chest discomfort or lower extremity swelling Skin: Denies abnormal skin rashes Lymphatics: Denies new lymphadenopathy or easy bruising Neurological:Denies numbness, tingling or new weaknesses Behavioral/Psych: Mood is stable, no new changes  All other systems were reviewed with the patient and are negative.  I have reviewed the past medical history, past surgical history, social history and family history with the patient and they are unchanged from previous note.  ALLERGIES:  has No Known Allergies.  MEDICATIONS:  Current Outpatient Medications  Medication Sig Dispense Refill  . Acetaminophen (TYLENOL PO) Take by mouth. PRN    . aspirin EC 81 MG tablet Take 81 mg by mouth every 4 (four) hours as needed.    . diphenhydramine-acetaminophen (TYLENOL PM) 25-500 MG TABS tablet Take 1 tablet by mouth at bedtime as needed.    Marland Kitchen escitalopram (LEXAPRO) 5 MG tablet Take 5 mg by mouth at bedtime.    . lidocaine-prilocaine (EMLA) cream Apply to affected area once 30 g 3  . ondansetron (ZOFRAN) 8 MG tablet Take 1 tablet (8 mg total) by mouth every 8 (eight) hours as needed. 30 tablet 1  . prochlorperazine (COMPAZINE) 10 MG tablet Take 1 tablet (10 mg total) by mouth every 6 (six) hours as needed (Nausea or vomiting). 30 tablet 1   No current facility-administered medications for this visit.    PHYSICAL EXAMINATION: ECOG PERFORMANCE STATUS: 1 - Symptomatic but completely ambulatory  Vitals:   06/29/20 1200  BP: 136/90  Pulse: 81  Resp: 18  Temp: 98.1 F (36.7 C)  SpO2: 99%   Filed Weights   06/29/20 1200  Weight: 219 lb 6.4 oz (99.5 kg)     GENERAL:alert, no distress and comfortable SKIN: skin color, texture, turgor are normal, no rashes or significant lesions EYES: normal, Conjunctiva are pink and non-injected, sclera clear OROPHARYNX:no exudate, no erythema and lips, buccal mucosa, and tongue normal  NECK: supple, thyroid normal size, non-tender, without nodularity LYMPH:  no palpable lymphadenopathy in the cervical, axillary or inguinal LUNGS: clear to auscultation and percussion with normal breathing effort HEART: regular rate & rhythm and no murmurs and no lower extremity edema ABDOMEN:abdomen soft, non-tender and normal bowel sounds Musculoskeletal:no cyanosis of digits and no clubbing  NEURO: alert & oriented x 3 with fluent speech, no focal motor/sensory deficits  LABORATORY DATA:  I have reviewed the data as listed    Component Value Date/Time   NA 138 06/28/2020 1200   K 3.8 06/28/2020 1200   CL 104 06/28/2020 1200   CO2 25 06/28/2020 1200   GLUCOSE 126 (H) 06/28/2020 1200   BUN 19 06/28/2020 1200   CREATININE 0.81 06/28/2020 1200   CALCIUM 9.5 06/28/2020 1200   PROT 7.3 06/03/2020 0822   ALBUMIN 4.7 06/03/2020 0822   AST 18 06/03/2020 0822   AST 20 05/14/2020 1211   ALT 21 06/03/2020 0822   ALT 31 05/14/2020 1211   ALKPHOS 63 06/03/2020 0822   BILITOT 0.8 06/03/2020 0822   BILITOT 0.7 05/14/2020 1211  GFRNONAA >60 06/28/2020 1200   GFRAA >60 06/28/2020 1200    No results found for: SPEP, UPEP  Lab Results  Component Value Date   WBC 3.8 (L) 06/28/2020   NEUTROABS 2.7 06/28/2020   HGB 13.3 06/28/2020   HCT 39.0 06/28/2020   MCV 92.6 06/28/2020   PLT 142 (L) 06/28/2020      Chemistry      Component Value Date/Time   NA 138 06/28/2020 1200   K 3.8 06/28/2020 1200   CL 104 06/28/2020 1200   CO2 25 06/28/2020 1200   BUN 19 06/28/2020 1200   CREATININE 0.81 06/28/2020 1200      Component Value Date/Time   CALCIUM 9.5 06/28/2020 1200   ALKPHOS 63 06/03/2020 0822   AST 18  06/03/2020 0822   AST 20 05/14/2020 1211   ALT 21 06/03/2020 0822   ALT 31 05/14/2020 1211   BILITOT 0.8 06/03/2020 0822   BILITOT 0.7 05/14/2020 1211

## 2020-06-29 NOTE — Telephone Encounter (Signed)
Per 7/20 los, no changes made to pt schedule

## 2020-06-29 NOTE — Telephone Encounter (Signed)
Called [employer] and spoke to Stuart regarding [facility]'s STD claim.  He said they had sent a request for pre-existing condition review to a Dr. Jeanella Craze.  Advised him that a Dr. Jeanella Craze does not work at [facility] and that would not be her treating physician.  He is going to send the claim to a Engineer, agricultural and have them reach out to the patient to make any corrections needed.  [employer] and advised her of conversation and to expect a call today.

## 2020-06-29 NOTE — Assessment & Plan Note (Signed)
We discussed the use of antiemetics as needed

## 2020-06-29 NOTE — Assessment & Plan Note (Signed)
She has mild acquired pancytopenia likely secondary to side effects of treatment She is not symptomatic Observe for now

## 2020-06-29 NOTE — Assessment & Plan Note (Signed)
She has new onset mild diarrhea likely secondary to side effects of treatment We discussed the importance of adequate hydration and to take Imodium as needed

## 2020-06-29 NOTE — Assessment & Plan Note (Signed)
So far, she tolerated treatment well apart from some mild pancytopenia and diarrhea She will continue treatment as scheduled I will see her weekly for further follow-up

## 2020-06-30 ENCOUNTER — Ambulatory Visit
Admission: RE | Admit: 2020-06-30 | Discharge: 2020-06-30 | Disposition: A | Discharge status: Home / Self Care | Payer: BC Managed Care – PPO | Source: Ambulatory Visit | Attending: Radiation Oncology | Admitting: Radiation Oncology

## 2020-06-30 ENCOUNTER — Other Ambulatory Visit: Payer: Self-pay

## 2020-06-30 ENCOUNTER — Inpatient Hospital Stay: Payer: BC Managed Care – PPO

## 2020-06-30 VITALS — BP 155/88 | HR 80 | Temp 98.1°F | Resp 16

## 2020-06-30 DIAGNOSIS — D61818 Other pancytopenia: Secondary | ICD-10-CM | POA: Diagnosis not present

## 2020-06-30 DIAGNOSIS — R110 Nausea: Secondary | ICD-10-CM | POA: Diagnosis not present

## 2020-06-30 DIAGNOSIS — K2970 Gastritis, unspecified, without bleeding: Secondary | ICD-10-CM | POA: Diagnosis not present

## 2020-06-30 DIAGNOSIS — T451X5A Adverse effect of antineoplastic and immunosuppressive drugs, initial encounter: Secondary | ICD-10-CM | POA: Diagnosis not present

## 2020-06-30 DIAGNOSIS — Z79899 Other long term (current) drug therapy: Secondary | ICD-10-CM | POA: Diagnosis not present

## 2020-06-30 DIAGNOSIS — C539 Malignant neoplasm of cervix uteri, unspecified: Secondary | ICD-10-CM | POA: Diagnosis not present

## 2020-06-30 DIAGNOSIS — R197 Diarrhea, unspecified: Secondary | ICD-10-CM | POA: Diagnosis not present

## 2020-06-30 DIAGNOSIS — Z7982 Long term (current) use of aspirin: Secondary | ICD-10-CM | POA: Diagnosis not present

## 2020-06-30 DIAGNOSIS — Z510 Encounter for antineoplastic radiation therapy: Secondary | ICD-10-CM | POA: Diagnosis not present

## 2020-06-30 DIAGNOSIS — Z5111 Encounter for antineoplastic chemotherapy: Secondary | ICD-10-CM | POA: Diagnosis not present

## 2020-06-30 MED ORDER — SODIUM CHLORIDE 0.9 % IV SOLN
Freq: Once | INTRAVENOUS | Status: AC
  Filled 2020-06-30: qty 250

## 2020-06-30 MED ORDER — PALONOSETRON HCL INJECTION 0.25 MG/5ML
0.2500 mg | Freq: Once | INTRAVENOUS | Status: AC
  Administered 2020-06-30: 0.25 mg via INTRAVENOUS

## 2020-06-30 MED ORDER — SODIUM CHLORIDE 0.9% FLUSH
10.0000 mL | INTRAVENOUS | Status: DC | PRN
  Administered 2020-06-30: 10 mL
  Filled 2020-06-30: qty 10

## 2020-06-30 MED ORDER — PALONOSETRON HCL INJECTION 0.25 MG/5ML
INTRAVENOUS | Status: AC
  Filled 2020-06-30: qty 5

## 2020-06-30 MED ORDER — HEPARIN SOD (PORK) LOCK FLUSH 100 UNIT/ML IV SOLN
500.0000 [IU] | Freq: Once | INTRAVENOUS | Status: AC | PRN
  Administered 2020-06-30: 500 [IU]
  Filled 2020-06-30: qty 5

## 2020-06-30 MED ORDER — ACETAMINOPHEN 325 MG PO TABS
ORAL_TABLET | ORAL | Status: AC
  Filled 2020-06-30: qty 1

## 2020-06-30 MED ORDER — SODIUM CHLORIDE 0.9 % IV SOLN
INTRAVENOUS | Status: DC
  Filled 2020-06-30: qty 250

## 2020-06-30 MED ORDER — SODIUM CHLORIDE 0.9 % IV SOLN
40.0000 mg/m2 | Freq: Once | INTRAVENOUS | Status: AC
  Administered 2020-06-30: 76 mg via INTRAVENOUS
  Filled 2020-06-30: qty 76

## 2020-06-30 MED ORDER — SODIUM CHLORIDE 0.9 % IV SOLN
150.0000 mg | Freq: Once | INTRAVENOUS | Status: AC
  Administered 2020-06-30: 150 mg via INTRAVENOUS
  Filled 2020-06-30: qty 150

## 2020-06-30 MED ORDER — SODIUM CHLORIDE 0.9 % IV SOLN
10.0000 mg | Freq: Once | INTRAVENOUS | Status: AC
  Administered 2020-06-30: 10 mg via INTRAVENOUS
  Filled 2020-06-30: qty 10

## 2020-06-30 MED ORDER — POTASSIUM CHLORIDE 2 MEQ/ML IV SOLN
Freq: Once | INTRAVENOUS | Status: AC
  Filled 2020-06-30: qty 10

## 2020-06-30 NOTE — Patient Instructions (Signed)
[facility] Discharge Instructions for Patients Receiving Chemotherapy  Today you received the following chemotherapy agents cisplatin  To help prevent nausea and vomiting after your treatment, we encourage you to take your nausea medication as directed   If you develop nausea and vomiting that is not controlled by your nausea medication, call the clinic.   BELOW ARE SYMPTOMS THAT SHOULD BE REPORTED IMMEDIATELY:  *FEVER GREATER THAN 100.5 F  *CHILLS WITH OR WITHOUT FEVER  NAUSEA AND VOMITING THAT IS NOT CONTROLLED WITH YOUR NAUSEA MEDICATION  *UNUSUAL SHORTNESS OF BREATH  *UNUSUAL BRUISING OR BLEEDING  TENDERNESS IN MOUTH AND THROAT WITH OR WITHOUT PRESENCE OF ULCERS  *URINARY PROBLEMS  *BOWEL PROBLEMS  UNUSUAL RASH Items with * indicate a potential emergency and should be followed up as soon as possible.  Feel free to call the clinic should you have any questions or concerns. The clinic phone number is [phone].  Please show the CHEMO ALERT CARD at check-in to the Emergency Department and triage nurse.   

## 2020-07-01 ENCOUNTER — Other Ambulatory Visit (HOSPITAL_COMMUNITY): Payer: Self-pay | Admitting: Radiation Oncology

## 2020-07-01 ENCOUNTER — Other Ambulatory Visit: Payer: Self-pay | Admitting: Radiation Oncology

## 2020-07-01 ENCOUNTER — Ambulatory Visit
Admission: RE | Admit: 2020-07-01 | Discharge: 2020-07-01 | Disposition: A | Discharge status: Home / Self Care | Payer: BC Managed Care – PPO | Source: Ambulatory Visit | Attending: Radiation Oncology | Admitting: Radiation Oncology

## 2020-07-01 ENCOUNTER — Other Ambulatory Visit: Payer: Self-pay

## 2020-07-01 DIAGNOSIS — Z510 Encounter for antineoplastic radiation therapy: Secondary | ICD-10-CM | POA: Diagnosis not present

## 2020-07-01 DIAGNOSIS — C539 Malignant neoplasm of cervix uteri, unspecified: Secondary | ICD-10-CM

## 2020-07-02 ENCOUNTER — Other Ambulatory Visit: Payer: Self-pay

## 2020-07-02 ENCOUNTER — Ambulatory Visit
Admission: RE | Admit: 2020-07-02 | Discharge: 2020-07-02 | Disposition: A | Discharge status: Home / Self Care | Payer: BC Managed Care – PPO | Source: Ambulatory Visit | Attending: Radiation Oncology | Admitting: Radiation Oncology

## 2020-07-02 ENCOUNTER — Ambulatory Visit: Payer: BC Managed Care – PPO | Admitting: Hematology and Oncology

## 2020-07-02 DIAGNOSIS — Z510 Encounter for antineoplastic radiation therapy: Secondary | ICD-10-CM | POA: Diagnosis not present

## 2020-07-02 DIAGNOSIS — C539 Malignant neoplasm of cervix uteri, unspecified: Secondary | ICD-10-CM | POA: Diagnosis not present

## 2020-07-05 ENCOUNTER — Inpatient Hospital Stay: Payer: BC Managed Care – PPO

## 2020-07-05 ENCOUNTER — Other Ambulatory Visit (HOSPITAL_COMMUNITY): Payer: Self-pay | Admitting: Radiation Oncology

## 2020-07-05 ENCOUNTER — Ambulatory Visit
Admission: RE | Admit: 2020-07-05 | Discharge: 2020-07-05 | Disposition: A | Discharge status: Home / Self Care | Payer: BC Managed Care – PPO | Source: Ambulatory Visit | Attending: Radiation Oncology | Admitting: Radiation Oncology

## 2020-07-05 ENCOUNTER — Other Ambulatory Visit: Payer: Self-pay

## 2020-07-05 ENCOUNTER — Ambulatory Visit: Payer: BC Managed Care – PPO

## 2020-07-05 DIAGNOSIS — Z5111 Encounter for antineoplastic chemotherapy: Secondary | ICD-10-CM | POA: Diagnosis not present

## 2020-07-05 DIAGNOSIS — K2970 Gastritis, unspecified, without bleeding: Secondary | ICD-10-CM | POA: Diagnosis not present

## 2020-07-05 DIAGNOSIS — R110 Nausea: Secondary | ICD-10-CM | POA: Diagnosis not present

## 2020-07-05 DIAGNOSIS — Z79899 Other long term (current) drug therapy: Secondary | ICD-10-CM | POA: Diagnosis not present

## 2020-07-05 DIAGNOSIS — D61818 Other pancytopenia: Secondary | ICD-10-CM | POA: Diagnosis not present

## 2020-07-05 DIAGNOSIS — Z7982 Long term (current) use of aspirin: Secondary | ICD-10-CM | POA: Diagnosis not present

## 2020-07-05 DIAGNOSIS — T451X5A Adverse effect of antineoplastic and immunosuppressive drugs, initial encounter: Secondary | ICD-10-CM | POA: Diagnosis not present

## 2020-07-05 DIAGNOSIS — R197 Diarrhea, unspecified: Secondary | ICD-10-CM | POA: Diagnosis not present

## 2020-07-05 DIAGNOSIS — C539 Malignant neoplasm of cervix uteri, unspecified: Secondary | ICD-10-CM

## 2020-07-05 DIAGNOSIS — Z510 Encounter for antineoplastic radiation therapy: Secondary | ICD-10-CM | POA: Diagnosis not present

## 2020-07-05 LAB — CBC WITH DIFFERENTIAL (CANCER CENTER ONLY)
Abs Immature Granulocytes: 0 10*3/uL (ref 0.00–0.07)
Basophils Absolute: 0 10*3/uL (ref 0.0–0.1)
Basophils Relative: 1 %
Eosinophils Absolute: 0.1 10*3/uL (ref 0.0–0.5)
Eosinophils Relative: 5 %
HCT: 37.6 % (ref 36.0–46.0)
Hemoglobin: 13 g/dL (ref 12.0–15.0)
Immature Granulocytes: 0 %
Lymphocytes Relative: 16 %
Lymphs Abs: 0.4 10*3/uL — ABNORMAL LOW (ref 0.7–4.0)
MCH: 32 pg (ref 26.0–34.0)
MCHC: 34.6 g/dL (ref 30.0–36.0)
MCV: 92.6 fL (ref 80.0–100.0)
Monocytes Absolute: 0.4 10*3/uL (ref 0.1–1.0)
Monocytes Relative: 16 %
Neutro Abs: 1.5 10*3/uL — ABNORMAL LOW (ref 1.7–7.7)
Neutrophils Relative %: 62 %
Platelet Count: 156 10*3/uL (ref 150–400)
RBC: 4.06 MIL/uL (ref 3.87–5.11)
RDW: 12.7 % (ref 11.5–15.5)
WBC Count: 2.4 10*3/uL — ABNORMAL LOW (ref 4.0–10.5)
nRBC: 0 % (ref 0.0–0.2)

## 2020-07-05 LAB — BASIC METABOLIC PANEL - CANCER CENTER ONLY
Anion gap: 11 (ref 5–15)
BUN: 18 mg/dL (ref 6–20)
CO2: 23 mmol/L (ref 22–32)
Calcium: 9.5 mg/dL (ref 8.9–10.3)
Chloride: 102 mmol/L (ref 98–111)
Creatinine: 0.79 mg/dL (ref 0.44–1.00)
GFR, Est AFR Am: >60 mL/min (ref >60)
GFR, Estimated: >60 mL/min (ref >60)
Glucose, Bld: 77 mg/dL (ref 70–99)
Potassium: 4.7 mmol/L (ref 3.5–5.1)
Sodium: 136 mmol/L (ref 135–145)

## 2020-07-05 LAB — MAGNESIUM: Magnesium: 2 mg/dL (ref 1.7–2.4)

## 2020-07-05 MED ORDER — HEPARIN SOD (PORK) LOCK FLUSH 100 UNIT/ML IV SOLN
250.0000 [IU] | Freq: Once | INTRAVENOUS | Status: AC
  Administered 2020-07-05: 250 [IU]
  Filled 2020-07-05: qty 5

## 2020-07-05 MED ORDER — SODIUM CHLORIDE 0.9% FLUSH
10.0000 mL | Freq: Once | INTRAVENOUS | Status: AC
  Administered 2020-07-05: 10 mL
  Filled 2020-07-05: qty 10

## 2020-07-06 ENCOUNTER — Other Ambulatory Visit: Payer: Self-pay

## 2020-07-06 ENCOUNTER — Inpatient Hospital Stay (HOSPITAL_BASED_OUTPATIENT_CLINIC_OR_DEPARTMENT_OTHER): Payer: BC Managed Care – PPO | Admitting: Hematology and Oncology

## 2020-07-06 ENCOUNTER — Ambulatory Visit: Payer: BC Managed Care – PPO

## 2020-07-06 ENCOUNTER — Ambulatory Visit
Admission: RE | Admit: 2020-07-06 | Discharge: 2020-07-06 | Disposition: A | Discharge status: Home / Self Care | Payer: BC Managed Care – PPO | Source: Ambulatory Visit | Attending: Radiation Oncology | Admitting: Radiation Oncology

## 2020-07-06 ENCOUNTER — Encounter: Payer: Self-pay | Admitting: Hematology and Oncology

## 2020-07-06 DIAGNOSIS — Z510 Encounter for antineoplastic radiation therapy: Secondary | ICD-10-CM | POA: Diagnosis not present

## 2020-07-06 DIAGNOSIS — R110 Nausea: Secondary | ICD-10-CM | POA: Diagnosis not present

## 2020-07-06 DIAGNOSIS — Z79899 Other long term (current) drug therapy: Secondary | ICD-10-CM | POA: Diagnosis not present

## 2020-07-06 DIAGNOSIS — C539 Malignant neoplasm of cervix uteri, unspecified: Secondary | ICD-10-CM | POA: Diagnosis not present

## 2020-07-06 DIAGNOSIS — Z5111 Encounter for antineoplastic chemotherapy: Secondary | ICD-10-CM | POA: Diagnosis not present

## 2020-07-06 DIAGNOSIS — D61818 Other pancytopenia: Secondary | ICD-10-CM | POA: Diagnosis not present

## 2020-07-06 DIAGNOSIS — R197 Diarrhea, unspecified: Secondary | ICD-10-CM | POA: Diagnosis not present

## 2020-07-06 DIAGNOSIS — Z7982 Long term (current) use of aspirin: Secondary | ICD-10-CM | POA: Diagnosis not present

## 2020-07-06 DIAGNOSIS — T451X5A Adverse effect of antineoplastic and immunosuppressive drugs, initial encounter: Secondary | ICD-10-CM | POA: Diagnosis not present

## 2020-07-06 DIAGNOSIS — K2970 Gastritis, unspecified, without bleeding: Secondary | ICD-10-CM | POA: Diagnosis not present

## 2020-07-06 NOTE — Progress Notes (Signed)
[place] OFFICE PROGRESS NOTE  Patient Care Team: [name], MD as PCP - General (Family Medicine) Awanda Mink Craige Cotta, RN as Oncology Nurse Navigator (Oncology)  ASSESSMENT & PLAN:  Cervical cancer [facility]) So far, she tolerated treatment well apart from some mild pancytopenia and diarrhea She will continue treatment as scheduled I plan to see her back again in of September with lab and port flush At the time, I will order repeat imaging study  Pancytopenia, acquired [facility]) She has mild acquired pancytopenia likely secondary to side effects of treatment She is not symptomatic Observe for now We will proceed with treatment as scheduled without delay  Diarrhea She has new onset mild diarrhea likely secondary to side effects of treatment We discussed the importance of adequate hydration and to take Imodium as needed   No orders of the defined types were placed in this encounter.   All questions were answered. The patient knows to call the clinic with any problems, questions or concerns. The total time spent in the appointment was 20 minutes encounter with patients including review of chart and various tests results, discussions about plan of care and coordination of care plan   Heath Lark, MD 07/06/2020 4:25 PM  INTERVAL HISTORY: Please see below for problem oriented charting. She is seen prior to cycle 5 of treatment She tolerated last cycle well She has very rare nausea Has some constipation alternate with diarrhea No recent infection, fever or chills Denies abnormal bleeding  SUMMARY OF ONCOLOGIC HISTORY: Oncology History  Cervical cancer ([place])  02/09/2020 Initial Diagnosis   Between 6-8 months ago, the patient endorses having irregular spotting intermittnetly every few weeks that would last for a few days. This did not change over time and she describes it as light spotting. This was the first bleeding she had after menopause. She ultimately saw her PCP  who performed a pap in March which returned showing HSIL. The patient tells me today that this is the first pap test she has had ever. She was then referred to gyn and underwent EMB/LEEP and transvaginal ultrasound for work-up of her PMB and high grade cervical dysplasia. Unfortunately, her pathology revealed SCC of the cervix   05/03/2020 Pathology Results   Endocervical curettings show fragments of squamous epithelium with high-grade squamous intraepithelial lesion, CIN-3 as well as fragments of invasive squamous cell carcinoma within the endometrial biopsy.  The invasive squamous cell carcinoma have depth of invasion more than 3 mm, with at least 10 mm, thickness almost 1.2 mm.   05/18/2020 Imaging   1. No findings of metastatic disease to the abdomen/pelvis. 2. There is a small amount of gas either along the cervical canal or the adjacent fornix. A well-defined mass is not seen. 3. Chronic bilateral pars defects at L5 with 7 mm grade 1 anterolisthesis of L5 on S1 and resulting mild left foraminal stenosis. 4. Suspected right foraminal disc protrusion at L3-4 possibly causing mild right foraminal impingement.     05/21/2020 Cancer Staging   Staging form: Cervix Uteri, AJCC Version 9 - Clinical stage from 05/21/2020: FIGO Stage IIB (cT2b, cM0) - Signed by Heath Lark, MD on 05/21/2020   05/26/2020 PET scan   1. Intensely hypermetabolic circumferential metabolic activity in the uterine cervix consistent with cervical carcinoma. 2. No evidence of metastatic disease in the pelvis. No metastatic lymphadenopathy. 3. No evidence of distant metastatic disease. 4. Focus of metabolic activity in the gastric antrum would be unlikely location for a cervical carcinoma metastasis.  Favor gastritis or potentially primary gastric neoplasm. Consider upper endoscopy for further evaluation. 5. Diffuse activity in the thyroid gland is favored thyroiditis.   06/03/2020 Procedure   Status post right IJ port catheter.    06/07/2020 -  Chemotherapy   The patient had weekly cisplatin for chemotherapy treatment.       REVIEW OF SYSTEMS:   Constitutional: Denies fevers, chills or abnormal weight loss Eyes: Denies blurriness of vision Ears, nose, mouth, throat, and face: Denies mucositis or sore throat Respiratory: Denies cough, dyspnea or wheezes Cardiovascular: Denies palpitation, chest discomfort or lower extremity swelling Skin: Denies abnormal skin rashes Lymphatics: Denies new lymphadenopathy or easy bruising Neurological:Denies numbness, tingling or new weaknesses Behavioral/Psych: Mood is stable, no new changes  All other systems were reviewed with the patient and are negative.  I have reviewed the past medical history, past surgical history, social history and family history with the patient and they are unchanged from previous note.  ALLERGIES:  has No Known Allergies.  MEDICATIONS:  Current Outpatient Medications  Medication Sig Dispense Refill  . acetaminophen (TYLENOL) 325 MG tablet Take 650 mg by mouth every 6 (six) hours as needed for moderate pain.     Marland Kitchen aspirin EC 81 MG tablet Take 81 mg by mouth every 4 (four) hours as needed. (Patient not taking: Reported on 07/06/2020)    . diphenhydrAMINE (BENADRYL) 25 MG tablet Take 25 mg by mouth every 6 (six) hours as needed for itching or allergies.    . diphenhydramine-acetaminophen (TYLENOL PM) 25-500 MG TABS tablet Take 1 tablet by mouth at bedtime as needed (sleep).     Marland Kitchen escitalopram (LEXAPRO) 5 MG tablet Take 5 mg by mouth at bedtime.    . lidocaine-prilocaine (EMLA) cream Apply to affected area once 30 g 3  . ondansetron (ZOFRAN) 8 MG tablet Take 1 tablet (8 mg total) by mouth every 8 (eight) hours as needed. 30 tablet 1  . prochlorperazine (COMPAZINE) 10 MG tablet Take 1 tablet (10 mg total) by mouth every 6 (six) hours as needed (Nausea or vomiting). 30 tablet 1   No current facility-administered medications for this visit.     PHYSICAL EXAMINATION: ECOG PERFORMANCE STATUS: 1 - Symptomatic but completely ambulatory  Vitals:   07/06/20 1159  BP: (!) 135/94  Pulse: 85  Resp: 18  Temp: 98.7 F (37.1 C)  SpO2: 100%   Filed Weights   07/06/20 1159  Weight: (!) 216 lb 12.8 oz (98.3 kg)    GENERAL:alert, no distress and comfortable SKIN: skin color, texture, turgor are normal, no rashes or significant lesions EYES: normal, Conjunctiva are pink and non-injected, sclera clear OROPHARYNX:no exudate, no erythema and lips, buccal mucosa, and tongue normal  NECK: supple, thyroid normal size, non-tender, without nodularity LYMPH:  no palpable lymphadenopathy in the cervical, axillary or inguinal LUNGS: clear to auscultation and percussion with normal breathing effort HEART: regular rate & rhythm and no murmurs and no lower extremity edema ABDOMEN:abdomen soft, non-tender and normal bowel sounds Musculoskeletal:no cyanosis of digits and no clubbing  NEURO: alert & oriented x 3 with fluent speech, no focal motor/sensory deficits  LABORATORY DATA:  I have reviewed the data as listed    Component Value Date/Time   NA 136 07/05/2020 1202   K 4.7 07/05/2020 1202   CL 102 07/05/2020 1202   CO2 23 07/05/2020 1202   GLUCOSE 77 07/05/2020 1202   BUN 18 07/05/2020 1202   CREATININE 0.79 07/05/2020 1202   CALCIUM 9.5 07/05/2020  1202   PROT 7.3 06/03/2020 0822   ALBUMIN 4.7 06/03/2020 0822   AST 18 06/03/2020 0822   AST 20 05/14/2020 1211   ALT 21 06/03/2020 0822   ALT 31 05/14/2020 1211   ALKPHOS 63 06/03/2020 0822   BILITOT 0.8 06/03/2020 0822   BILITOT 0.7 05/14/2020 1211   GFRNONAA >60 07/05/2020 1202   GFRAA >60 07/05/2020 1202    No results found for: SPEP, UPEP  Lab Results  Component Value Date   WBC 2.4 (L) 07/05/2020   NEUTROABS 1.5 (L) 07/05/2020   HGB 13.0 07/05/2020   HCT 37.6 07/05/2020   MCV 92.6 07/05/2020   PLT 156 07/05/2020      Chemistry      Component Value Date/Time   NA  136 07/05/2020 1202   K 4.7 07/05/2020 1202   CL 102 07/05/2020 1202   CO2 23 07/05/2020 1202   BUN 18 07/05/2020 1202   CREATININE 0.79 07/05/2020 1202      Component Value Date/Time   CALCIUM 9.5 07/05/2020 1202   ALKPHOS 63 06/03/2020 0822   AST 18 06/03/2020 0822   AST 20 05/14/2020 1211   ALT 21 06/03/2020 0822   ALT 31 05/14/2020 1211   BILITOT 0.8 06/03/2020 0822   BILITOT 0.7 05/14/2020 1211

## 2020-07-06 NOTE — Assessment & Plan Note (Signed)
So far, she tolerated treatment well apart from some mild pancytopenia and diarrhea She will continue treatment as scheduled I plan to see her back again in of September with lab and port flush At the time, I will order repeat imaging study

## 2020-07-06 NOTE — Assessment & Plan Note (Signed)
She has mild acquired pancytopenia likely secondary to side effects of treatment She is not symptomatic Observe for now We will proceed with treatment as scheduled without delay

## 2020-07-06 NOTE — Assessment & Plan Note (Signed)
She has new onset mild diarrhea likely secondary to side effects of treatment We discussed the importance of adequate hydration and to take Imodium as needed

## 2020-07-07 ENCOUNTER — Other Ambulatory Visit: Payer: Self-pay

## 2020-07-07 ENCOUNTER — Ambulatory Visit
Admission: RE | Admit: 2020-07-07 | Discharge: 2020-07-07 | Disposition: A | Discharge status: Home / Self Care | Payer: BC Managed Care – PPO | Source: Ambulatory Visit | Attending: Radiation Oncology | Admitting: Radiation Oncology

## 2020-07-07 ENCOUNTER — Telehealth: Payer: Self-pay | Admitting: Hematology and Oncology

## 2020-07-07 ENCOUNTER — Inpatient Hospital Stay: Payer: BC Managed Care – PPO

## 2020-07-07 ENCOUNTER — Ambulatory Visit: Payer: BC Managed Care – PPO

## 2020-07-07 VITALS — BP 115/84 | HR 86 | Temp 98.2°F | Resp 18

## 2020-07-07 DIAGNOSIS — K2970 Gastritis, unspecified, without bleeding: Secondary | ICD-10-CM | POA: Diagnosis not present

## 2020-07-07 DIAGNOSIS — Z79899 Other long term (current) drug therapy: Secondary | ICD-10-CM | POA: Diagnosis not present

## 2020-07-07 DIAGNOSIS — Z7982 Long term (current) use of aspirin: Secondary | ICD-10-CM | POA: Diagnosis not present

## 2020-07-07 DIAGNOSIS — Z5111 Encounter for antineoplastic chemotherapy: Secondary | ICD-10-CM | POA: Diagnosis not present

## 2020-07-07 DIAGNOSIS — R110 Nausea: Secondary | ICD-10-CM | POA: Diagnosis not present

## 2020-07-07 DIAGNOSIS — Z510 Encounter for antineoplastic radiation therapy: Secondary | ICD-10-CM | POA: Diagnosis not present

## 2020-07-07 DIAGNOSIS — C539 Malignant neoplasm of cervix uteri, unspecified: Secondary | ICD-10-CM | POA: Diagnosis not present

## 2020-07-07 DIAGNOSIS — D61818 Other pancytopenia: Secondary | ICD-10-CM | POA: Diagnosis not present

## 2020-07-07 DIAGNOSIS — R197 Diarrhea, unspecified: Secondary | ICD-10-CM | POA: Diagnosis not present

## 2020-07-07 DIAGNOSIS — T451X5A Adverse effect of antineoplastic and immunosuppressive drugs, initial encounter: Secondary | ICD-10-CM | POA: Diagnosis not present

## 2020-07-07 MED ORDER — PALONOSETRON HCL INJECTION 0.25 MG/5ML
0.2500 mg | Freq: Once | INTRAVENOUS | Status: AC
  Administered 2020-07-07: 0.25 mg via INTRAVENOUS

## 2020-07-07 MED ORDER — PALONOSETRON HCL INJECTION 0.25 MG/5ML
INTRAVENOUS | Status: AC
  Filled 2020-07-07: qty 5

## 2020-07-07 MED ORDER — SODIUM CHLORIDE 0.9 % IV SOLN
150.0000 mg | Freq: Once | INTRAVENOUS | Status: AC
  Administered 2020-07-07: 150 mg via INTRAVENOUS
  Filled 2020-07-07: qty 150

## 2020-07-07 MED ORDER — HEPARIN SOD (PORK) LOCK FLUSH 100 UNIT/ML IV SOLN
500.0000 [IU] | Freq: Once | INTRAVENOUS | Status: AC | PRN
  Administered 2020-07-07: 500 [IU]
  Filled 2020-07-07: qty 5

## 2020-07-07 MED ORDER — POTASSIUM CHLORIDE 2 MEQ/ML IV SOLN
Freq: Once | INTRAVENOUS | Status: AC
  Filled 2020-07-07: qty 10

## 2020-07-07 MED ORDER — SODIUM CHLORIDE 0.9 % IV SOLN
Freq: Once | INTRAVENOUS | Status: AC
  Filled 2020-07-07: qty 250

## 2020-07-07 MED ORDER — SODIUM CHLORIDE 0.9 % IV SOLN
40.0000 mg/m2 | Freq: Once | INTRAVENOUS | Status: AC
  Administered 2020-07-07: 76 mg via INTRAVENOUS
  Filled 2020-07-07: qty 76

## 2020-07-07 MED ORDER — SODIUM CHLORIDE 0.9% FLUSH
10.0000 mL | INTRAVENOUS | Status: DC | PRN
  Administered 2020-07-07: 10 mL
  Filled 2020-07-07: qty 10

## 2020-07-07 MED ORDER — SODIUM CHLORIDE 0.9 % IV SOLN
10.0000 mg | Freq: Once | INTRAVENOUS | Status: AC
  Administered 2020-07-07: 10 mg via INTRAVENOUS
  Filled 2020-07-07: qty 10

## 2020-07-07 NOTE — Progress Notes (Signed)
Per Dr. Shadad, okay to run post hydration fluids with cisplatin.  

## 2020-07-07 NOTE — Telephone Encounter (Signed)
Scheduled per 7/27 sch msg. New calendar printout giving to pt in tx area

## 2020-07-07 NOTE — Patient Instructions (Signed)
[facility] Discharge Instructions for Patients Receiving Chemotherapy  Today you received the following chemotherapy agents cisplatin  To help prevent nausea and vomiting after your treatment, we encourage you to take your nausea medication as directed   If you develop nausea and vomiting that is not controlled by your nausea medication, call the clinic.   BELOW ARE SYMPTOMS THAT SHOULD BE REPORTED IMMEDIATELY:  *FEVER GREATER THAN 100.5 F  *CHILLS WITH OR WITHOUT FEVER  NAUSEA AND VOMITING THAT IS NOT CONTROLLED WITH YOUR NAUSEA MEDICATION  *UNUSUAL SHORTNESS OF BREATH  *UNUSUAL BRUISING OR BLEEDING  TENDERNESS IN MOUTH AND THROAT WITH OR WITHOUT PRESENCE OF ULCERS  *URINARY PROBLEMS  *BOWEL PROBLEMS  UNUSUAL RASH Items with * indicate a potential emergency and should be followed up as soon as possible.  Feel free to call the clinic should you have any questions or concerns. The clinic phone number is [phone].  Please show the CHEMO ALERT CARD at check-in to the Emergency Department and triage nurse.   

## 2020-07-08 ENCOUNTER — Other Ambulatory Visit: Payer: Self-pay

## 2020-07-08 ENCOUNTER — Ambulatory Visit: Payer: BC Managed Care – PPO

## 2020-07-08 ENCOUNTER — Ambulatory Visit
Admission: RE | Admit: 2020-07-08 | Discharge: 2020-07-08 | Disposition: A | Discharge status: Home / Self Care | Payer: BC Managed Care – PPO | Source: Ambulatory Visit | Attending: Radiation Oncology | Admitting: Radiation Oncology

## 2020-07-08 DIAGNOSIS — Z510 Encounter for antineoplastic radiation therapy: Secondary | ICD-10-CM | POA: Diagnosis not present

## 2020-07-08 DIAGNOSIS — C539 Malignant neoplasm of cervix uteri, unspecified: Secondary | ICD-10-CM | POA: Diagnosis not present

## 2020-07-09 ENCOUNTER — Other Ambulatory Visit: Payer: Self-pay

## 2020-07-09 ENCOUNTER — Ambulatory Visit
Admission: RE | Admit: 2020-07-09 | Discharge: 2020-07-09 | Disposition: A | Discharge status: Home / Self Care | Payer: BC Managed Care – PPO | Source: Ambulatory Visit | Attending: Radiation Oncology | Admitting: Radiation Oncology

## 2020-07-09 ENCOUNTER — Ambulatory Visit: Payer: BC Managed Care – PPO

## 2020-07-09 DIAGNOSIS — Z510 Encounter for antineoplastic radiation therapy: Secondary | ICD-10-CM | POA: Diagnosis not present

## 2020-07-09 DIAGNOSIS — C539 Malignant neoplasm of cervix uteri, unspecified: Secondary | ICD-10-CM | POA: Diagnosis not present

## 2020-07-12 ENCOUNTER — Ambulatory Visit: Payer: BC Managed Care – PPO

## 2020-07-12 ENCOUNTER — Other Ambulatory Visit: Payer: Self-pay

## 2020-07-12 ENCOUNTER — Ambulatory Visit
Admission: RE | Admit: 2020-07-12 | Discharge: 2020-07-12 | Disposition: A | Discharge status: Home / Self Care | Payer: BC Managed Care – PPO | Source: Ambulatory Visit | Attending: Radiation Oncology | Admitting: Radiation Oncology

## 2020-07-12 DIAGNOSIS — C539 Malignant neoplasm of cervix uteri, unspecified: Secondary | ICD-10-CM | POA: Diagnosis not present

## 2020-07-12 DIAGNOSIS — C530 Malignant neoplasm of endocervix: Secondary | ICD-10-CM | POA: Insufficient documentation

## 2020-07-12 DIAGNOSIS — Z510 Encounter for antineoplastic radiation therapy: Secondary | ICD-10-CM | POA: Diagnosis not present

## 2020-07-13 ENCOUNTER — Other Ambulatory Visit: Payer: Self-pay

## 2020-07-13 ENCOUNTER — Ambulatory Visit
Admission: RE | Admit: 2020-07-13 | Discharge: 2020-07-13 | Disposition: A | Discharge status: Home / Self Care | Payer: BC Managed Care – PPO | Source: Ambulatory Visit | Attending: Radiation Oncology | Admitting: Radiation Oncology

## 2020-07-13 DIAGNOSIS — Z510 Encounter for antineoplastic radiation therapy: Secondary | ICD-10-CM | POA: Diagnosis not present

## 2020-07-13 DIAGNOSIS — C539 Malignant neoplasm of cervix uteri, unspecified: Secondary | ICD-10-CM | POA: Diagnosis not present

## 2020-07-13 DIAGNOSIS — C530 Malignant neoplasm of endocervix: Secondary | ICD-10-CM | POA: Diagnosis not present

## 2020-07-14 ENCOUNTER — Ambulatory Visit
Admission: RE | Admit: 2020-07-14 | Discharge: 2020-07-14 | Disposition: A | Discharge status: Home / Self Care | Payer: BC Managed Care – PPO | Source: Ambulatory Visit | Attending: Radiation Oncology | Admitting: Radiation Oncology

## 2020-07-14 ENCOUNTER — Other Ambulatory Visit: Payer: Self-pay

## 2020-07-14 DIAGNOSIS — C539 Malignant neoplasm of cervix uteri, unspecified: Secondary | ICD-10-CM | POA: Diagnosis not present

## 2020-07-14 DIAGNOSIS — C530 Malignant neoplasm of endocervix: Secondary | ICD-10-CM | POA: Diagnosis not present

## 2020-07-14 DIAGNOSIS — Z510 Encounter for antineoplastic radiation therapy: Secondary | ICD-10-CM | POA: Diagnosis not present

## 2020-07-15 ENCOUNTER — Other Ambulatory Visit: Payer: Self-pay

## 2020-07-15 ENCOUNTER — Ambulatory Visit
Admission: RE | Admit: 2020-07-15 | Discharge: 2020-07-15 | Disposition: A | Discharge status: Home / Self Care | Payer: BC Managed Care – PPO | Source: Ambulatory Visit | Attending: Radiation Oncology | Admitting: Radiation Oncology

## 2020-07-15 DIAGNOSIS — Z510 Encounter for antineoplastic radiation therapy: Secondary | ICD-10-CM | POA: Diagnosis not present

## 2020-07-15 DIAGNOSIS — C539 Malignant neoplasm of cervix uteri, unspecified: Secondary | ICD-10-CM | POA: Diagnosis not present

## 2020-07-15 DIAGNOSIS — C530 Malignant neoplasm of endocervix: Secondary | ICD-10-CM | POA: Diagnosis not present

## 2020-07-16 ENCOUNTER — Other Ambulatory Visit: Payer: Self-pay

## 2020-07-16 ENCOUNTER — Ambulatory Visit
Admission: RE | Admit: 2020-07-16 | Discharge: 2020-07-16 | Disposition: A | Discharge status: Home / Self Care | Payer: BC Managed Care – PPO | Source: Ambulatory Visit | Attending: Radiation Oncology | Admitting: Radiation Oncology

## 2020-07-16 DIAGNOSIS — Z510 Encounter for antineoplastic radiation therapy: Secondary | ICD-10-CM | POA: Diagnosis not present

## 2020-07-16 DIAGNOSIS — C530 Malignant neoplasm of endocervix: Secondary | ICD-10-CM | POA: Diagnosis not present

## 2020-07-16 DIAGNOSIS — C539 Malignant neoplasm of cervix uteri, unspecified: Secondary | ICD-10-CM | POA: Diagnosis not present

## 2020-07-16 NOTE — Patient Instructions (Addendum)
DUE TO COVID-19 ONLY ONE VISITOR IS ALLOWED TO COME WITH YOU AND STAY IN THE WAITING ROOM ONLY DURING PRE OP AND PROCEDURE DAY OF SURGERY. THE 1 VISITOR  MAY VISIT WITH YOU AFTER SURGERY IN YOUR PRIVATE ROOM DURING VISITING HOURS ONLY!  YOU NEED TO HAVE A COVID 19 TEST ON: 07/22/20 @ 1:00 pm , THIS TEST MUST BE DONE BEFORE SURGERY,  COVID TESTING SITE [place]  [id number], IT IS ON THE RIGHT GOING OUT [address] APPROXIMATELY  2 MINUTES PAST [employer] ON THE RIGHT. ONCE YOUR COVID TEST IS COMPLETED,  PLEASE BEGIN THE QUARANTINE INSTRUCTIONS AS OUTLINED IN YOUR HANDOUT.                Kelly Hanson    Your procedure is scheduled on: 07/26/20   Report to [facility] Main  Entrance   Report to short stay at: 5:30 AM     Call this number if you have problems the morning of surgery [phone]    Remember: Do not eat food or drink liquids :After Midnight.   BRUSH YOUR TEETH MORNING OF SURGERY AND RINSE YOUR MOUTH OUT, NO CHEWING GUM CANDY OR MINTS.                You may not have any metal on your body including hair pins and              piercings  Do not wear jewelry, make-up, lotions, powders or perfumes, deodorant             Do not wear nail polish on your fingernails.  Do not shave  48 hours prior to surgery.    Do not bring valuables to the hospital. [place].  Contacts, dentures or bridgework may not be worn into surgery.  Leave suitcase in the car. After surgery it may be brought to your room.     Patients discharged the day of surgery will not be allowed to drive home. IF YOU ARE HAVING SURGERY AND GOING HOME THE SAME DAY, YOU MUST HAVE AN ADULT TO DRIVE YOU HOME AND BE WITH YOU FOR 24 HOURS. YOU MAY GO HOME BY TAXI OR UBER OR ORTHERWISE, BUT AN ADULT MUST ACCOMPANY YOU HOME AND STAY WITH YOU FOR 24 HOURS.  Name and phone number of your driver:  Special Instructions: N/A               Please read over the following fact sheets you were given: _____________________________________________________________________      [facility] - Preparing for Surgery Before surgery, you can play an important role.  Because skin is not sterile, your skin needs to be as free of germs as possible.  You can reduce the number of germs on your skin by washing with CHG (chlorahexidine gluconate) soap before surgery.  CHG is an antiseptic cleaner which kills germs and bonds with the skin to continue killing germs even after washing. Please DO NOT use if you have an allergy to CHG or antibacterial soaps.  If your skin becomes reddened/irritated stop using the CHG and inform your nurse when you arrive at Short Stay. Do not shave (including legs and underarms) for at least 48 hours prior to the first CHG shower.  You may shave your face/neck. Please follow these instructions carefully:  1.  Shower with CHG Soap  the night before surgery and the  morning of Surgery.  2.  If you choose to wash your hair, wash your hair first as usual with your  normal  shampoo.  3.  After you shampoo, rinse your hair and body thoroughly to remove the  shampoo.                           4.  Use CHG as you would any other liquid soap.  You can apply chg directly  to the skin and wash                       Gently with a scrungie or clean washcloth.  5.  Apply the CHG Soap to your body ONLY FROM THE NECK DOWN.   Do not use on face/ open                           Wound or open sores. Avoid contact with eyes, ears mouth and genitals (private parts).                       Wash face,  Genitals (private parts) with your normal soap.             6.  Wash thoroughly, paying special attention to the area where your surgery  will be performed.  7.  Thoroughly rinse your body with warm water from the neck down.  8.  DO NOT shower/wash with your normal soap after using and rinsing off  the CHG Soap.                9.  Pat yourself dry with  a clean towel.            10.  Wear clean pajamas.            11.  Place clean sheets on your bed the night of your first shower and do not  sleep with pets. Day of Surgery : Do not apply any lotions/deodorants the morning of surgery.  Please wear clean clothes to the hospital/surgery center.  FAILURE TO FOLLOW THESE INSTRUCTIONS MAY RESULT IN THE CANCELLATION OF YOUR SURGERY PATIENT SIGNATURE_________________________________  NURSE SIGNATURE__________________________________  ________________________________________________________________________

## 2020-07-16 NOTE — Progress Notes (Signed)
Pt. Needs orders for upcomming procedure on 07/26/20. PST appointment on 07/19/20.Thanks.

## 2020-07-19 ENCOUNTER — Encounter (HOSPITAL_COMMUNITY)
Admission: RE | Admit: 2020-07-19 | Discharge: 2020-07-19 | Disposition: A | Discharge status: Home / Self Care | Payer: BC Managed Care – PPO | Source: Ambulatory Visit | Attending: Radiation Oncology | Admitting: Radiation Oncology

## 2020-07-19 ENCOUNTER — Other Ambulatory Visit: Payer: Self-pay

## 2020-07-19 ENCOUNTER — Encounter (HOSPITAL_COMMUNITY): Payer: Self-pay

## 2020-07-19 ENCOUNTER — Ambulatory Visit
Admission: RE | Admit: 2020-07-19 | Discharge: 2020-07-19 | Disposition: A | Discharge status: Home / Self Care | Payer: BC Managed Care – PPO | Source: Ambulatory Visit | Attending: Radiation Oncology | Admitting: Radiation Oncology

## 2020-07-19 ENCOUNTER — Encounter: Payer: Self-pay | Admitting: Radiation Oncology

## 2020-07-19 DIAGNOSIS — C530 Malignant neoplasm of endocervix: Secondary | ICD-10-CM | POA: Diagnosis not present

## 2020-07-19 DIAGNOSIS — Z01812 Encounter for preprocedural laboratory examination: Secondary | ICD-10-CM | POA: Insufficient documentation

## 2020-07-19 DIAGNOSIS — Z510 Encounter for antineoplastic radiation therapy: Secondary | ICD-10-CM | POA: Diagnosis not present

## 2020-07-19 DIAGNOSIS — C539 Malignant neoplasm of cervix uteri, unspecified: Secondary | ICD-10-CM | POA: Diagnosis not present

## 2020-07-19 HISTORY — DX: Palpitations: R00.2

## 2020-07-19 HISTORY — DX: Anxiety disorder, unspecified: F41.9

## 2020-07-19 LAB — CBC
HCT: 35.7 % — ABNORMAL LOW (ref 36.0–46.0)
Hemoglobin: 12.1 g/dL (ref 12.0–15.0)
MCH: 32.7 pg (ref 26.0–34.0)
MCHC: 33.9 g/dL (ref 30.0–36.0)
MCV: 96.5 fL (ref 80.0–100.0)
Platelets: 124 10*3/uL — ABNORMAL LOW (ref 150–400)
RBC: 3.7 MIL/uL — ABNORMAL LOW (ref 3.87–5.11)
RDW: 14.8 % (ref 11.5–15.5)
WBC: 2.9 10*3/uL — ABNORMAL LOW (ref 4.0–10.5)
nRBC: 0 % (ref 0.0–0.2)

## 2020-07-19 NOTE — Progress Notes (Addendum)
COVID Vaccine Completed:NO Date COVID Vaccine completed: COVID vaccine manufacturer: [place]   PCP - Dr. [name]. Cardiologist - NO.  Chest x-ray -  EKG -  Stress Test -  ECHO -  Cardiac Cath -   Sleep Study -  CPAP -   Fasting Blood Sugar -  Checks Blood Sugar _____ times a day  Blood Thinner Instructions: Aspirin on hold. Aspirin Instructions: Last Dose:  Anesthesia review:   Patient denies shortness of breath, fever, cough and chest pain at PAT appointment   Patient verbalized understanding of instructions that were given to them at the PAT appointment. Patient was also instructed that they will need to review over the PAT instructions again at home before surgery.

## 2020-07-19 NOTE — Progress Notes (Signed)
Lab results: WBC: 2.9

## 2020-07-20 ENCOUNTER — Encounter (HOSPITAL_BASED_OUTPATIENT_CLINIC_OR_DEPARTMENT_OTHER): Payer: Self-pay | Admitting: Radiation Oncology

## 2020-07-20 NOTE — Progress Notes (Signed)
Spoke with patient and made aware surgery location changed to  [facility], no food candy, gum or mints after midnight, clear liquids from midnight until 430 am ( may have black coffee with sugar, no creamer or milk products in coffee). Arrive 530 am 07-26-2020 [facility].  Cbc done 07-19-2020 epic

## 2020-07-20 NOTE — H&P (Signed)
Radiation Oncology         [phone] ________________________________  History and physical examination  Name: Kelly Hanson MRN: [id number]  Date: 06/30/2020  DOB: 1961-10-15  NW:GNFAOZH, Anderson Malta, MD  No ref. provider found   REFERRING PHYSICIAN: No ref. provider found  DIAGNOSIS: The encounter diagnosis was Malignant neoplasm of cervix, unspecified site [facility]).   Clinical stage IIB squamous cell carcinoma of the cervix  HISTORY OF PRESENT ILLNESS::Kelly Hanson is a 59 y.o. female who is seen as a courtesy of Dr Berline Lopes for an opinion concerning radiation therapy as part of management of the patient's recently diagnosed locally advanced squamous cell carcinoma of the cervix. The patient presented to her PCP in March of 2021 for evaluation of 6-8 month history of intermittent postmenopausal vaginal bleeding. A PAP smear was performed at that time, which showed a high-grade squamous intraepithelial lesion. She was then referred to a gynecologist and underwent an EMB/LEEP on 05/03/2020 and a transvaginal ultrasound for work-up of her postmenopausal bleeding and high-grade cervical dysplasia. Pathology revealed squamous cell carcinoma of the cervix.  The patient was referred to Dr. Berline Lopes and was seen in consultation on 05/14/2020. At that time, they discussed treatment options including surgery and radiation therapy. Given that the tumor was noted to replace most of the cervix and there was thickening of the parametrial tissue bilaterally just adjacent to and behind the cervix, the patient was noted to not be a candidate for primary surgical treatment. Thus, they discussed the plan for PET/CT scan followed by primary radiation with sensitizing Cisplatin.  CT scan of abdomen/pelvis on 05/18/2020 showed a small amount of gas either along the cervical canal or the adjacent fornix. A well-defined mass was not seen. There was also noted to be chronic bilateral pars defects at L5 with 7 mm grade 1  anterolisthesis of L5 on S1 and resulting mild left foraminal stenosis. Additionally, there was a suspected right foraminal disc protrusion at L3-4 that was possibly causing mild right foraminal impingement. Fortunately, there were no findings of metastatic disease to the abdomen or pelvis.  PET scan showed intense activity in the region of the cervix without any metastatic spread.  Patient has recently completed weeks of external beam radiation therapy along with radiosensitizing chemotherapy.  Patient is now ready to proceed with intracavitary brachytherapy treatments to complete her definitive course of radiation therapy.  PREVIOUS RADIATION THERAPY: No  PAST MEDICAL HISTORY:  Past Medical History:  Diagnosis Date  . Anxiety   . Cervical cancer ([place])   . Obesity (BMI 30-39.9)   . Palpitations     PAST SURGICAL HISTORY: Past Surgical History:  Procedure Laterality Date  . IR IMAGING GUIDED PORT INSERTION  06/03/2020  . THERAPEUTIC ABORTION      FAMILY HISTORY:  Family History  Problem Relation Age of Onset  . Stroke Maternal Grandmother   . Colon cancer Neg Hx   . Breast cancer Neg Hx   . Endometrial cancer Neg Hx   . 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 Smoking status: Former Research scientist (life sciences)  . Smokeless tobacco: Never Used  . Tobacco comment: quit 2019  Vaping Use  . Vaping Use: Never used  Substance Use Topics  . Alcohol use: Not Currently  . Drug use: Never    ALLERGIES: No Known Allergies  MEDICATIONS:  No current facility-administered  medications for this encounter.   Current Outpatient Medications  Medication Sig Dispense Refill  . acetaminophen (TYLENOL) 325 MG tablet Take 650 mg by mouth every 6 (six) hours as needed for moderate pain.     . diphenhydrAMINE (BENADRYL) 25 MG tablet Take 25 mg by mouth every 6 (six) hours as needed for itching or allergies.      . diphenhydramine-acetaminophen (TYLENOL PM) 25-500 MG TABS tablet Take 1 tablet by mouth at bedtime as needed (sleep).     Marland Kitchen escitalopram (LEXAPRO) 5 MG tablet Take 5 mg by mouth at bedtime.    . ondansetron (ZOFRAN) 8 MG tablet Take 1 tablet (8 mg total) by mouth every 8 (eight) hours as needed. 30 tablet 1  . prochlorperazine (COMPAZINE) 10 MG tablet Take 1 tablet (10 mg total) by mouth every 6 (six) hours as needed (Nausea or vomiting). 30 tablet 1  . aspirin EC 81 MG tablet Take 81 mg by mouth every 4 (four) hours as needed. (Patient not taking: Reported on 07/06/2020)    . lidocaine-prilocaine (EMLA) cream Apply to affected area once 30 g 3    REVIEW OF SYSTEMS:  A 10+ POINT REVIEW OF SYSTEMS WAS OBTAINED including neurology, dermatology, psychiatry, cardiac, respiratory, lymph, extremities, GI, GU, musculoskeletal, constitutional, reproductive, HEENT.  Patient reports chronic low back pain.  She also has noticed some mild pelvic pain.  She denies any hematuria or rectal bleeding.  Appetite is good.  Energy level is good   PHYSICAL EXAM:General: Alert and oriented, in no acute distress HEENT: Head is normocephalic. Extraocular movements are intact. Neck: Neck is supple, no palpable cervical or supraclavicular lymphadenopathy. Heart: Regular in rate and rhythm with no murmurs, rubs, or gallops. Chest: Clear to auscultation bilaterally, with no rhonchi, wheezes, or rales. Abdomen: Soft, nontender, nondistended, with no rigidity or guarding. Extremities: No cyanosis or edema. Lymphatics: see Neck Exam Skin: No concerning lesions. Musculoskeletal: symmetric strength and muscle tone throughout. Neurologic: Cranial nerves II through XII are grossly intact. No obvious focalities. Speech is fluent. Coordination is intact. Psychiatric: Judgment and insight are intact. Affect is appropriate. On pelvic examination deferred to intraoperative procedure   ECOG = 1   LABORATORY DATA:  Lab Results   Component Value Date   WBC 2.9 (L) 07/19/2020   HGB 12.1 07/19/2020   HCT 35.7 (L) 07/19/2020   MCV 96.5 07/19/2020   PLT 124 (L) 07/19/2020   NEUTROABS 1.5 (L) 07/05/2020   Lab Results  Component Value Date   NA 136 07/05/2020   K 4.7 07/05/2020   CL 102 07/05/2020   CO2 23 07/05/2020   GLUCOSE 77 07/05/2020   CREATININE 0.79 07/05/2020   CALCIUM 9.5 07/05/2020      RADIOGRAPHY: No results found.    IMPRESSION: Clinical stage IIB squamous cell carcinoma of the cervix  Patient is now ready to proceed with intracavitary brachytherapy treatments.  PLAN: Patient will be taken to the operating room on August 16 at 730.  She will undergo exam under anesthesia and placement of tandem ring apparatus in preparation for intracavitary high-dose-rate radiation therapy using iridium 192 as the high-dose-rate source.  The patient is scheduled to receive a total of 5 high-dose-rate treatments to complete her definitive course of radiation therapy.    ------------------------------------------------  Blair Promise, PhD, MD  This document serves as a record of services personally performed by Gery Pray, MD. It was created on his behalf by Clerance Lav, a trained medical scribe. The creation of  this record is based on the scribe's personal observations and the provider's statements to them. This document has been checked and approved by the attending provider.

## 2020-07-21 NOTE — Progress Notes (Incomplete)
  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 She did experience a brief episode of vaginal bleeding after her first treatment, however she stated that it was not abnormal for her to experience vaginal spotting after a bowel movement. She also reported mild fatigue and diarrhea that was managed with Imodium. She denied nausea, vomiting, dysuria, skin irritation, and rectal bleeding.  Plan: The patient has completed external beam therapy. She will now proceed with intracavitary brachytherapy that is scheduled to begin on 07/26/2020.  ________________________________________________   Blair Promise, PhD, MD  This document serves as a record of services personally performed by Gery Pray, MD. It was created on his behalf by Clerance Lav, a trained medical scribe. The creation of this record is based on the scribe's personal observations and the provider's statements to them. This document has been checked and approved by the attending provider.

## 2020-07-21 NOTE — Progress Notes (Signed)
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 Identity was confirmed. All relevant records and images related to the planned course of therapy were reviewed. The patient freely provided informed written consent to proceed with treatment after reviewing the details related to the planned course of therapy. The consent form was witnessed and verified by the simulation staff. Then, the patient was set-up in a stable reproducible supine position for radiation therapy. The tandem ring system was accessed and fiducial markers were placed within the tandem and ring.   Simple treatment device note: On the operating room the patient had construction of her custom tandem ring system. She will be treated with a 45 tandem/ring system. The patient had placement of a 40 mm tandem. A cervical ring with a small shielding was used for her treatment. A rectal paddle was also part of her custom set up device.  Verification simulation note: An AP and lateral film was obtained through the pelvis area. This was compared to the patient's planning films documenting accurate position of the tandem/ring system for treatment.  High-dose-rate brachytherapy treatment note:  The remote afterloading device was accessed through catheter system and attached to the tandem ring system. Patient then proceeded to undergo her first high-dose-rate treatment directed at the cervix. The patient was prescribed a dose of 5.5 gray to be delivered to the high risk clinical target volume. Patient was treated with 2 channels using 21 dwell positions. Treatment time was 399.2 seconds. The patient tolerated the procedure well. After completion of her  therapy, a radiation survey was performed documenting return of the iridium source into the GammaMed safe. The patient was then transferred to the nursing suite. She then had removal of the rectal paddle followed by the tandem and ring system. The patient tolerated the removal well.  PLAN: The patient will return next week for her second high-dose-rate treatment. ________________________________    Blair Promise, PhD, MD  This document serves as a record of services personally performed by Gery Pray, MD. It was created on his behalf by Clerance Lav, a trained medical scribe. The creation of this record is based on the scribe's personal observations and the provider's statements to them. This document has been checked and approved by the attending provider.

## 2020-07-21 NOTE — Progress Notes (Signed)
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  Identity was confirmed.  All relevant records and images related to the planned course of therapy were reviewed.  The patient freely provided informed written consent to proceed with treatment after reviewing the details related to the planned course of therapy. The consent form was witnessed and verified by the simulation staff.  Then, the patient was set-up in a stable reproducible  supine position for radiation therapy.  CT images were obtained.  Surface markings were placed.  The CT images were loaded into the planning software.  Then the target and avoidance structures were contoured.  Treatment planning then occurred.  The radiation prescription was entered and confirmed.   I have requested : Brachytherapy Isodose Plan and Dosimetry Calculations to plan the radiation distribution.    PLAN:  The patient will receive 5.5 Gy in 1 fraction.  Patient will be treated with the tandem/ring high-dose-rate apparatus.  Iridium 192 will be the high-dose-rate source.  ________________________________   Blair Promise, PhD, MD  This document serves as a record of services personally performed by Gery Pray, MD. It was created on his behalf by Clerance Lav, a trained medical scribe. The creation of this record is based on the scribe's personal observations and the provider's statements to them. This document has been checked and approved by the attending provider.

## 2020-07-22 ENCOUNTER — Other Ambulatory Visit (HOSPITAL_COMMUNITY)
Admission: RE | Admit: 2020-07-22 | Discharge: 2020-07-22 | Disposition: A | Discharge status: Home / Self Care | Payer: BC Managed Care – PPO | Source: Ambulatory Visit | Attending: Radiation Oncology | Admitting: Radiation Oncology

## 2020-07-22 DIAGNOSIS — Z20822 Contact with and (suspected) exposure to covid-19: Secondary | ICD-10-CM | POA: Insufficient documentation

## 2020-07-22 DIAGNOSIS — Z01812 Encounter for preprocedural laboratory examination: Secondary | ICD-10-CM | POA: Insufficient documentation

## 2020-07-22 LAB — SARS CORONAVIRUS 2 (TAT 6-24 HRS): SARS Coronavirus 2: NEGATIVE

## 2020-07-26 ENCOUNTER — Ambulatory Visit (HOSPITAL_BASED_OUTPATIENT_CLINIC_OR_DEPARTMENT_OTHER): Payer: BC Managed Care – PPO | Admitting: Physician Assistant

## 2020-07-26 ENCOUNTER — Encounter (HOSPITAL_BASED_OUTPATIENT_CLINIC_OR_DEPARTMENT_OTHER)
Admission: RE | Disposition: A | Discharge status: Other Acute Inpatient Hospital | Payer: Self-pay | Source: Home / Self Care | Attending: Radiation Oncology

## 2020-07-26 ENCOUNTER — Ambulatory Visit
Admission: RE | Admit: 2020-07-26 | Discharge: 2020-07-26 | Disposition: A | Discharge status: Home / Self Care | Payer: BC Managed Care – PPO | Source: Ambulatory Visit | Attending: Radiation Oncology | Admitting: Radiation Oncology

## 2020-07-26 ENCOUNTER — Ambulatory Visit (HOSPITAL_BASED_OUTPATIENT_CLINIC_OR_DEPARTMENT_OTHER): Payer: BC Managed Care – PPO | Admitting: Anesthesiology

## 2020-07-26 ENCOUNTER — Encounter (HOSPITAL_BASED_OUTPATIENT_CLINIC_OR_DEPARTMENT_OTHER): Payer: Self-pay | Admitting: Radiation Oncology

## 2020-07-26 ENCOUNTER — Ambulatory Visit (HOSPITAL_COMMUNITY)
Admission: RE | Admit: 2020-07-26 | Discharge: 2020-07-26 | Disposition: A | Discharge status: Home / Self Care | Payer: BC Managed Care – PPO | Source: Ambulatory Visit | Attending: Radiation Oncology | Admitting: Radiation Oncology

## 2020-07-26 ENCOUNTER — Ambulatory Visit (HOSPITAL_BASED_OUTPATIENT_CLINIC_OR_DEPARTMENT_OTHER)
Admission: RE | Admit: 2020-07-26 | Discharge: 2020-07-26 | Disposition: A | Discharge status: Other Acute Inpatient Hospital | Payer: BC Managed Care – PPO | Attending: Radiation Oncology | Admitting: Radiation Oncology

## 2020-07-26 DIAGNOSIS — Z6834 Body mass index (BMI) 34.0-34.9, adult: Secondary | ICD-10-CM | POA: Insufficient documentation

## 2020-07-26 DIAGNOSIS — C530 Malignant neoplasm of endocervix: Secondary | ICD-10-CM

## 2020-07-26 DIAGNOSIS — C539 Malignant neoplasm of cervix uteri, unspecified: Secondary | ICD-10-CM | POA: Diagnosis not present

## 2020-07-26 DIAGNOSIS — F419 Anxiety disorder, unspecified: Secondary | ICD-10-CM | POA: Insufficient documentation

## 2020-07-26 DIAGNOSIS — Z87891 Personal history of nicotine dependence: Secondary | ICD-10-CM | POA: Insufficient documentation

## 2020-07-26 DIAGNOSIS — Z79899 Other long term (current) drug therapy: Secondary | ICD-10-CM | POA: Insufficient documentation

## 2020-07-26 HISTORY — PX: OPERATIVE ULTRASOUND: SHX5996

## 2020-07-26 HISTORY — PX: TANDEM RING INSERTION: SHX6199

## 2020-07-26 SURGERY — INSERTION, UTERINE TANDEM AND RING OR CYLINDER, FOR BRACHYTHERAPY
Anesthesia: General | Site: Vagina

## 2020-07-26 MED ORDER — MIDAZOLAM HCL 2 MG/2ML IJ SOLN: 0.5000 mg | Freq: Once | INTRAMUSCULAR | Status: DC | PRN

## 2020-07-26 MED ORDER — FENTANYL CITRATE (PF) 100 MCG/2ML IJ SOLN
INTRAMUSCULAR | Status: AC
  Filled 2020-07-26: qty 2

## 2020-07-26 MED ORDER — LIDOCAINE 2% (20 MG/ML) 5 ML SYRINGE
INTRAMUSCULAR | Status: DC | PRN
  Administered 2020-07-26: 60 mg via INTRAVENOUS

## 2020-07-26 MED ORDER — FENTANYL CITRATE (PF) 100 MCG/2ML IJ SOLN
25.0000 ug | INTRAMUSCULAR | Status: DC | PRN
  Administered 2020-07-26 (×2): 25 ug via INTRAVENOUS
  Administered 2020-07-26: 50 ug via INTRAVENOUS

## 2020-07-26 MED ORDER — DEXAMETHASONE SODIUM PHOSPHATE 10 MG/ML IJ SOLN
INTRAMUSCULAR | Status: AC
  Filled 2020-07-26: qty 1

## 2020-07-26 MED ORDER — FENTANYL CITRATE (PF) 100 MCG/2ML IJ SOLN
INTRAMUSCULAR | Status: DC | PRN
  Administered 2020-07-26: 100 ug via INTRAVENOUS
  Administered 2020-07-26 (×2): 25 ug via INTRAVENOUS
  Administered 2020-07-26: 50 ug via INTRAVENOUS

## 2020-07-26 MED ORDER — ORAL CARE MOUTH RINSE: 15.0000 mL | Freq: Once | OROMUCOSAL | Status: DC

## 2020-07-26 MED ORDER — SCOPOLAMINE 1 MG/3DAYS TD PT72
1.0000 | MEDICATED_PATCH | TRANSDERMAL | Status: DC
  Administered 2020-07-26: 1.5 mg via TRANSDERMAL

## 2020-07-26 MED ORDER — CHLORHEXIDINE GLUCONATE 0.12 % MT SOLN: 15.0000 mL | Freq: Once | OROMUCOSAL | Status: DC

## 2020-07-26 MED ORDER — ACETAMINOPHEN 500 MG PO TABS
ORAL_TABLET | ORAL | Status: AC
  Filled 2020-07-26: qty 2

## 2020-07-26 MED ORDER — KETOROLAC TROMETHAMINE 30 MG/ML IJ SOLN
INTRAMUSCULAR | Status: AC
  Filled 2020-07-26: qty 1

## 2020-07-26 MED ORDER — LACTATED RINGERS IV SOLN: INTRAVENOUS | Status: DC

## 2020-07-26 MED ORDER — HYDROMORPHONE HCL 1 MG/ML IJ SOLN
0.5000 mg | Freq: Once | INTRAMUSCULAR | Status: AC
  Administered 2020-07-26: 0.5 mg via INTRAVENOUS
  Filled 2020-07-26: qty 1

## 2020-07-26 MED ORDER — SCOPOLAMINE 1 MG/3DAYS TD PT72
MEDICATED_PATCH | TRANSDERMAL | Status: AC
  Filled 2020-07-26: qty 1

## 2020-07-26 MED ORDER — KETOROLAC TROMETHAMINE 30 MG/ML IJ SOLN
INTRAMUSCULAR | Status: DC | PRN
  Administered 2020-07-26: 30 mg via INTRAVENOUS

## 2020-07-26 MED ORDER — MIDAZOLAM HCL 2 MG/2ML IJ SOLN
INTRAMUSCULAR | Status: AC
  Filled 2020-07-26: qty 2

## 2020-07-26 MED ORDER — PROMETHAZINE HCL 25 MG/ML IJ SOLN: 6.2500 mg | INTRAMUSCULAR | Status: DC | PRN

## 2020-07-26 MED ORDER — DEXAMETHASONE SODIUM PHOSPHATE 4 MG/ML IJ SOLN
INTRAMUSCULAR | Status: DC | PRN
  Administered 2020-07-26: 10 mg via INTRAVENOUS

## 2020-07-26 MED ORDER — ACETAMINOPHEN 500 MG PO TABS
1000.0000 mg | ORAL_TABLET | Freq: Once | ORAL | Status: AC
  Administered 2020-07-26: 1000 mg via ORAL

## 2020-07-26 MED ORDER — ONDANSETRON HCL 4 MG/2ML IJ SOLN
INTRAMUSCULAR | Status: DC | PRN
  Administered 2020-07-26: 4 mg via INTRAVENOUS

## 2020-07-26 MED ORDER — MEPERIDINE HCL 25 MG/ML IJ SOLN: 6.2500 mg | INTRAMUSCULAR | Status: DC | PRN

## 2020-07-26 MED ORDER — PROPOFOL 10 MG/ML IV BOLUS
INTRAVENOUS | Status: AC
  Filled 2020-07-26: qty 40

## 2020-07-26 MED ORDER — MIDAZOLAM HCL 5 MG/5ML IJ SOLN
INTRAMUSCULAR | Status: DC | PRN
  Administered 2020-07-26: 2 mg via INTRAVENOUS

## 2020-07-26 MED ORDER — LIDOCAINE 2% (20 MG/ML) 5 ML SYRINGE
INTRAMUSCULAR | Status: AC
  Filled 2020-07-26: qty 5

## 2020-07-26 MED ORDER — ONDANSETRON HCL 4 MG/2ML IJ SOLN
INTRAMUSCULAR | Status: AC
  Filled 2020-07-26: qty 2

## 2020-07-26 MED ORDER — POVIDONE-IODINE 10 % EX SWAB: 2.0000 "application " | Freq: Once | CUTANEOUS | Status: DC

## 2020-07-26 MED ORDER — PROPOFOL 10 MG/ML IV BOLUS
INTRAVENOUS | Status: DC | PRN
  Administered 2020-07-26: 200 mg via INTRAVENOUS

## 2020-07-26 MED ORDER — SODIUM CHLORIDE 0.9 % IR SOLN
Status: DC | PRN
  Administered 2020-07-26: 1000 mL

## 2020-07-26 SURGICAL SUPPLY — 22 items
BNDG CONFORM 2 STRL LF (GAUZE/BANDAGES/DRESSINGS) IMPLANT
COVER WAND RF STERILE (DRAPES) ×4 IMPLANT
DILATOR CANAL MILEX (MISCELLANEOUS) ×4 IMPLANT
DRSG PAD ABDOMINAL 8X10 ST (GAUZE/BANDAGES/DRESSINGS) ×4 IMPLANT
GAUZE 4X4 16PLY RFD (DISPOSABLE) ×4 IMPLANT
GLOVE BIO SURGEON STRL SZ7.5 (GLOVE) ×8 IMPLANT
GLOVE BIOGEL PI IND STRL 7.5 (GLOVE) ×4 IMPLANT
GLOVE BIOGEL PI INDICATOR 7.5 (GLOVE) ×4
GLOVE ECLIPSE 7.5 STRL STRAW (GLOVE) ×4 IMPLANT
GOWN STRL REUS W/TWL LRG LVL3 (GOWN DISPOSABLE) ×8 IMPLANT
HOLDER FOLEY CATH W/STRAP (MISCELLANEOUS) ×4 IMPLANT
HOVERMATT SINGLE USE (MISCELLANEOUS) ×4 IMPLANT
IV NS 1000ML (IV SOLUTION) ×4
IV NS 1000ML BAXH (IV SOLUTION) ×2 IMPLANT
IV SET EXTENSION GRAVITY 40 LF (IV SETS) ×4 IMPLANT
KIT TURNOVER CYSTO (KITS) ×4 IMPLANT
PACK VAGINAL MINOR WOMEN LF (CUSTOM PROCEDURE TRAY) ×4 IMPLANT
PACKING VAGINAL (PACKING) IMPLANT
PAD OB MATERNITY 4.3X12.25 (PERSONAL CARE ITEMS) IMPLANT
TOWEL OR 17X26 10 PK STRL BLUE (TOWEL DISPOSABLE) ×4 IMPLANT
TRAY FOLEY W/BAG SLVR 14FR (SET/KITS/TRAYS/PACK) ×4 IMPLANT
WATER STERILE IRR 500ML POUR (IV SOLUTION) ×4 IMPLANT

## 2020-07-26 NOTE — Op Note (Signed)
07/26/2020  9:35 AM  PATIENT:  Kelly Hanson  59 y.o. female  PRE-OPERATIVE DIAGNOSIS:  CERVICAL CANCER  POST-OPERATIVE DIAGNOSIS:  CERVICAL CANCER  PROCEDURE:  Procedure(s): TANDEM RING INSERTION FOR HIGH DOSE RADIATION THERAPY (N/A) OPERATIVE ULTRASOUND (N/A)  SURGEON:  Surgeon(s) and Role:    Gery Pray, MD - Primary  PHYSICIAN ASSISTANT:   ASSISTANTS: none   ANESTHESIA:   general  EBL:  10 mL  BLOOD ADMINISTERED:none  DRAINS: Urinary Catheter (Foley)   LOCAL MEDICATIONS USED:  NONE  SPECIMEN:  No Specimen  DISPOSITION OF SPECIMEN:  N/A  COUNTS:  YES  TOURNIQUET:  * No tourniquets in log *  DICTATION: Patient was taken to [facility] long outpatient surgical center room 1.  A general anesthetic was applied.  patient was placed in the dorsolithotomy position. timeout was performed for the procedure preoperative medications and estimated time for surgery.  Patient was prepped and draped in the usual sterile fashion.  A Foley catheter was placed and then backfilled with approximately 300 cc of sterile water for ultrasound imaging purposes.  On exam under anesthesia the patient was noted to have an excellent response to her external beam radiation therapy and radiosensitizing chemotherapy.  There was no parametrial extension on intraoperative examination and no obvious extension into the vaginal region.  The cervix was estimated to be approximately 3 x 3-1/2 cm.  Rectovaginal exam confirmed.  Patient proceeded to undergo serial dilation of the cervical os.   The uterus was anteverted and sounded to approximately 6 cm.  Overall the uterus was small in size.  Patient then underwent placement of a 45 degree  tandem with a cervical sleeve in place.  The length of the tandem portion was 40 mm.  Patient then had a 45 degree cervical ring placed along the cervix with a small shielding cap In place.  This was then followed by a rectal paddle to complete the full assembly of the  high-dose-rate equipment.  Intraoperative ultrasound there was good placement of the tandem within the endocervical and endometrial cavity.  Patient tolerated the procedure well.  She was subsequently taken to the recovery room in sterile condition.  Later in the day the patient will be taken down to radiation oncology for planning and her first high-dose-rate treatment.  She is scheduled to receive a total of 5 high-dose-rate treatments.  PLAN OF CARE: Transferred to radiation oncology for planning and treatment  PATIENT DISPOSITION:  PACU - hemodynamically stable.   Delay start of Pharmacological VTE agent (>24hrs) due to surgical blood loss or risk of bleeding: not applicable

## 2020-07-26 NOTE — Progress Notes (Addendum)
Error

## 2020-07-26 NOTE — Discharge Instructions (Signed)

## 2020-07-26 NOTE — Transfer of Care (Signed)
Immediate Anesthesia Transfer of Care Note  Patient: Kelly Hanson  Procedure(s) Performed: Procedure(s) (LRB): TANDEM RING INSERTION FOR HIGH DOSE RADIATION THERAPY (N/A) OPERATIVE ULTRASOUND (N/A)  Patient Location: PACU  Anesthesia Type: General  Level of Consciousness: awake, sedated, patient cooperative and responds to stimulation  Airway & Oxygen Therapy: Patient Spontanous Breathing and Patient connected to [place] 02 and soft FM  Post-op Assessment: Report given to PACU RN, Post -op Vital signs reviewed and stable and Patient moving all extremities  Post vital signs: Reviewed and stable  Complications: No apparent anesthesia complications

## 2020-07-26 NOTE — Anesthesia Preprocedure Evaluation (Addendum)
Anesthesia Evaluation  Patient identified by MRN, date of birth, ID band Patient awake    Reviewed: Allergy & Precautions, NPO status , Patient's Chart, lab work & pertinent test results  History of Anesthesia Complications Negative for: history of anesthetic complications  Airway Mallampati: II  TM Distance: >3 FB Neck ROM: Full    Dental  (+) Dental Advisory Given, Teeth Intact   Pulmonary neg pulmonary ROS, former smoker,  07/22/2020 SARS coronavirus NEG   breath sounds clear to auscultation       Cardiovascular negative cardio ROS   Rhythm:Regular Rate:Normal     Neuro/Psych negative neurological ROS     GI/Hepatic negative GI ROS, Neg liver ROS,   Endo/Other  Morbid obesity  Renal/GU negative Renal ROS   Cervical cancer    Musculoskeletal   Abdominal (+) + obese,   Peds  Hematology negative hematology ROS (+)   Anesthesia Other Findings   Reproductive/Obstetrics                            Anesthesia Physical Anesthesia Plan  ASA: II  Anesthesia Plan: General   Post-op Pain Management:    Induction: Intravenous  PONV Risk Score and Plan: 3 and Ondansetron, Dexamethasone and Scopolamine patch - Pre-op  Airway Management Planned: LMA  Additional Equipment:   Intra-op Plan:   Post-operative Plan:   Informed Consent: I have reviewed the patients History and Physical, chart, labs and discussed the procedure including the risks, benefits and alternatives for the proposed anesthesia with the patient or authorized representative who has indicated his/her understanding and acceptance.     Dental advisory given  Plan Discussed with: CRNA and Surgeon  Anesthesia Plan Comments:        Anesthesia Quick Evaluation

## 2020-07-26 NOTE — Progress Notes (Addendum)
945am Patient transferred via stretcher from [facility] outpatient PACU to CT Sim.1006 am Patient transferred to rm 1 of the Radiation clinic for observation and holding prior to her HDR tx. Patient reports minimal cramping  and declines pain medication. Patient is oriented x 3. Her IV is infusing to her right hand at 125 cc an hour of LR on the Alaris pump. IV site is dry and intact. Foley catheter is draining clear amber into the drainage bag. Alternating pressure stocking attached to the pump. Tandem ring equipment is intact in patient's vagina. T 97.6 P 65 R 18 BP in left arm 158/97 O2 sat 100%. 1210 Patient taken by stretcher for her HDR [place] tx and declines pain medicine but requests medication prior to having her Tandem ring equipment removed. 1230 Patient back in room 1 and given Dilaudid .5 mg IV for pain per order of Dr. Sondra Come. 1237 Foley catheter removed intact. [place] Dr. Sondra Come in and the Tandem Ring equipment was removed.without problems. 105 pm BP 151/99, P 73 O2 sat 99% 110 pm Patient discharged from the clinic via w/c to home with her husband.  IV fluid total in the Clinic was 400 cc. No PO intake. Urine output was 1000 cc. Patient advised to call Dr. Sondra Come if she was having any problems.

## 2020-07-26 NOTE — Interval H&P Note (Signed)
History and Physical Interval Note:  07/26/2020 7:25 AM  Kelly Hanson  has presented today for surgery, with the diagnosis of CERVICAL CANCER.  The various methods of treatment have been discussed with the patient and family. After consideration of risks, benefits and other options for treatment, the patient has consented to  Procedure(s): TANDEM RING INSERTION (N/A) OPERATIVE ULTRASOUND (N/A) as a surgical intervention.  The patient's history has been reviewed, patient examined, no change in status, stable for surgery.  I have reviewed the patient's chart and labs.  Questions were answered to the patient's satisfaction.     Gery Pray

## 2020-07-26 NOTE — Anesthesia Procedure Notes (Signed)
Procedure Name: LMA Insertion Date/Time: 07/26/2020 7:42 AM Performed by: Justice Rocher, CRNA Pre-anesthesia Checklist: Patient identified, Emergency Drugs available, Suction available, Patient being monitored and Timeout performed Patient Re-evaluated:Patient Re-evaluated prior to induction Oxygen Delivery Method: Circle system utilized Preoxygenation: Pre-oxygenation with 100% oxygen Induction Type: IV induction Ventilation: Mask ventilation without difficulty LMA: LMA inserted LMA Size: 4.0 Number of attempts: 1 Airway Equipment and Method: Bite block Placement Confirmation: positive ETCO2,  breath sounds checked- equal and bilateral and CO2 detector Tube secured with: Tape Dental Injury: Teeth and Oropharynx as per pre-operative assessment

## 2020-07-26 NOTE — Anesthesia Postprocedure Evaluation (Signed)
Anesthesia Post Note  Patient: Kelly Hanson  Procedure(s) Performed: TANDEM RING INSERTION FOR HIGH DOSE RADIATION THERAPY (N/A Vagina ) OPERATIVE ULTRASOUND (N/A Abdomen)     Patient location during evaluation: PACU Anesthesia Type: General Level of consciousness: awake and alert, patient cooperative and oriented Pain management: pain level controlled Vital Signs Assessment: post-procedure vital signs reviewed and stable Respiratory status: spontaneous breathing, nonlabored ventilation and respiratory function stable Cardiovascular status: blood pressure returned to baseline and stable Postop Assessment: no apparent nausea or vomiting and adequate PO intake Anesthetic complications: no   No complications documented.  Last Vitals:  Vitals:   07/26/20 0915 07/26/20 0930  BP: (!) 159/92 (!) 159/97  Pulse: 72 74  Resp: 16 18  Temp:  36.6 C  SpO2: 96% 99%    Last Pain:  Vitals:   07/26/20 0930  TempSrc:   PainSc: 3                  Kayvan Hoefling,E. Annalysse Shoemaker

## 2020-07-27 ENCOUNTER — Encounter (HOSPITAL_BASED_OUTPATIENT_CLINIC_OR_DEPARTMENT_OTHER): Payer: Self-pay | Admitting: Radiation Oncology

## 2020-07-27 ENCOUNTER — Other Ambulatory Visit: Payer: Self-pay

## 2020-07-27 NOTE — Progress Notes (Addendum)
Spoke w/ via phone for pre-op interview---PT Lab needs dos----   none            Lab results------cbc 07-19-2020 epic COVID test ------pt to quarantine for 08-02-2020 surgery per beverly taavon Arrive at -------530 am 08-02-2020 NPO after MN NO Solid Food.  Clear liquids from MN until---430 am then npo Medications to take morning of surgery -----none Diabetic medication -----n/a Patient Special Instructions -----none Pre-Op special Istructions -----none Patient verbalized understanding of instructions that were given at this phone interview. Patient denies shortness of breath, chest pain, fever, cough at this phone interview.  Addendum: Spoke with beverly taavon and pt may quarantine for 08-02-2020 and 08-09-20 surgeries. Spoke with patient and made pt aware and patient stated she would quarantine for 08-02-2020 and 08-09-2020 surgeries and go to testing site for 08-23-2020 covid test

## 2020-07-28 ENCOUNTER — Encounter (HOSPITAL_BASED_OUTPATIENT_CLINIC_OR_DEPARTMENT_OTHER): Payer: Self-pay | Admitting: Radiation Oncology

## 2020-07-29 ENCOUNTER — Other Ambulatory Visit (HOSPITAL_COMMUNITY): Payer: BC Managed Care – PPO

## 2020-08-01 NOTE — Anesthesia Preprocedure Evaluation (Addendum)
Anesthesia Evaluation  Patient identified by MRN, date of birth, ID band Patient awake    Reviewed: Allergy & Precautions, NPO status , Patient's Chart, lab work & pertinent test results  History of Anesthesia Complications Negative for: history of anesthetic complications  Airway Mallampati: II  TM Distance: >3 FB Neck ROM: Full    Dental  (+) Missing,    Pulmonary former smoker,    Pulmonary exam normal        Cardiovascular negative cardio ROS Normal cardiovascular exam     Neuro/Psych PSYCHIATRIC DISORDERS Anxiety negative neurological ROS     GI/Hepatic negative GI ROS, Neg liver ROS,   Endo/Other  BMI 34  Renal/GU negative Renal ROS  negative genitourinary   Musculoskeletal negative musculoskeletal ROS (+)   Abdominal   Peds  Hematology  (+) Blood dyscrasia, anemia , hct 35.7, plt 124   Anesthesia Other Findings   Reproductive/Obstetrics Cervical ca                           Anesthesia Physical Anesthesia Plan  ASA: II  Anesthesia Plan: General   Post-op Pain Management:    Induction: Intravenous  PONV Risk Score and Plan: 4 or greater and Ondansetron, Dexamethasone, Midazolam, Treatment may vary due to age or medical condition and Scopolamine patch - Pre-op  Airway Management Planned: LMA  Additional Equipment: None  Intra-op Plan:   Post-operative Plan: Extubation in OR  Informed Consent: I have reviewed the patients History and Physical, chart, labs and discussed the procedure including the risks, benefits and alternatives for the proposed anesthesia with the patient or authorized representative who has indicated his/her understanding and acceptance.     Dental advisory given  Plan Discussed with: CRNA  Anesthesia Plan Comments:       Anesthesia Quick Evaluation

## 2020-08-02 ENCOUNTER — Ambulatory Visit
Admission: RE | Admit: 2020-08-02 | Discharge: 2020-08-02 | Disposition: A | Discharge status: Home / Self Care | Payer: BC Managed Care – PPO | Source: Ambulatory Visit | Attending: Radiation Oncology | Admitting: Radiation Oncology

## 2020-08-02 ENCOUNTER — Ambulatory Visit
Admission: RE | Admit: 2020-08-02 | Payer: BC Managed Care – PPO | Source: Ambulatory Visit | Admitting: Radiation Oncology

## 2020-08-02 ENCOUNTER — Ambulatory Visit (HOSPITAL_COMMUNITY): Admission: RE | Admit: 2020-08-02 | Payer: BC Managed Care – PPO | Source: Ambulatory Visit

## 2020-08-02 DIAGNOSIS — C539 Malignant neoplasm of cervix uteri, unspecified: Secondary | ICD-10-CM

## 2020-08-02 NOTE — Progress Notes (Signed)
Spoke with patient by phone and patient aware  Surgery date changed to 08-06-2020, arrive 815 am [facility] , no solid food,  candy , gum or mints after midnight night before surgery, clear liquids until 715 am then npo, no medications to take am of surgery.  Patient to continue quarantine per beverly taavon for 08-06-2020 and 08-09-20 surgeries and patient stated understanding to continue quarantine.

## 2020-08-06 ENCOUNTER — Ambulatory Visit (HOSPITAL_BASED_OUTPATIENT_CLINIC_OR_DEPARTMENT_OTHER)
Admission: RE | Admit: 2020-08-06 | Discharge: 2020-08-06 | Disposition: A | Discharge status: Home / Self Care | Payer: BC Managed Care – PPO | Attending: Radiation Oncology | Admitting: Radiation Oncology

## 2020-08-06 ENCOUNTER — Encounter (HOSPITAL_BASED_OUTPATIENT_CLINIC_OR_DEPARTMENT_OTHER): Payer: Self-pay | Admitting: Radiation Oncology

## 2020-08-06 ENCOUNTER — Other Ambulatory Visit: Payer: Self-pay

## 2020-08-06 ENCOUNTER — Ambulatory Visit (HOSPITAL_BASED_OUTPATIENT_CLINIC_OR_DEPARTMENT_OTHER): Payer: BC Managed Care – PPO | Admitting: Anesthesiology

## 2020-08-06 ENCOUNTER — Ambulatory Visit
Admission: RE | Admit: 2020-08-06 | Discharge: 2020-08-06 | Disposition: A | Discharge status: Home / Self Care | Payer: BC Managed Care – PPO | Source: Ambulatory Visit | Attending: Radiation Oncology | Admitting: Radiation Oncology

## 2020-08-06 ENCOUNTER — Encounter (HOSPITAL_BASED_OUTPATIENT_CLINIC_OR_DEPARTMENT_OTHER)
Admission: RE | Disposition: A | Discharge status: Home / Self Care | Payer: Self-pay | Source: Home / Self Care | Attending: Radiation Oncology

## 2020-08-06 ENCOUNTER — Ambulatory Visit (HOSPITAL_COMMUNITY)
Admission: RE | Admit: 2020-08-06 | Discharge: 2020-08-06 | Disposition: A | Discharge status: Home / Self Care | Payer: BC Managed Care – PPO | Source: Ambulatory Visit | Attending: Radiation Oncology | Admitting: Radiation Oncology

## 2020-08-06 DIAGNOSIS — C539 Malignant neoplasm of cervix uteri, unspecified: Secondary | ICD-10-CM | POA: Insufficient documentation

## 2020-08-06 DIAGNOSIS — E669 Obesity, unspecified: Secondary | ICD-10-CM | POA: Insufficient documentation

## 2020-08-06 DIAGNOSIS — K2970 Gastritis, unspecified, without bleeding: Secondary | ICD-10-CM | POA: Diagnosis not present

## 2020-08-06 DIAGNOSIS — D630 Anemia in neoplastic disease: Secondary | ICD-10-CM | POA: Diagnosis not present

## 2020-08-06 DIAGNOSIS — Z6834 Body mass index (BMI) 34.0-34.9, adult: Secondary | ICD-10-CM | POA: Insufficient documentation

## 2020-08-06 DIAGNOSIS — F419 Anxiety disorder, unspecified: Secondary | ICD-10-CM | POA: Diagnosis not present

## 2020-08-06 DIAGNOSIS — Z87891 Personal history of nicotine dependence: Secondary | ICD-10-CM | POA: Diagnosis not present

## 2020-08-06 HISTORY — PX: TANDEM RING INSERTION: SHX6199

## 2020-08-06 HISTORY — PX: OPERATIVE ULTRASOUND: SHX5996

## 2020-08-06 SURGERY — INSERTION, UTERINE TANDEM AND RING OR CYLINDER, FOR BRACHYTHERAPY
Anesthesia: General | Site: Vagina

## 2020-08-06 MED ORDER — SCOPOLAMINE 1 MG/3DAYS TD PT72: 1.0000 | MEDICATED_PATCH | Freq: Once | TRANSDERMAL | Status: DC

## 2020-08-06 MED ORDER — PROPOFOL 10 MG/ML IV BOLUS
INTRAVENOUS | Status: DC | PRN
  Administered 2020-08-06: 200 mg via INTRAVENOUS

## 2020-08-06 MED ORDER — OXYCODONE HCL 5 MG PO TABS: 5.0000 mg | ORAL_TABLET | Freq: Once | ORAL | Status: DC | PRN

## 2020-08-06 MED ORDER — PROMETHAZINE HCL 25 MG/ML IJ SOLN: 6.2500 mg | INTRAMUSCULAR | Status: DC | PRN

## 2020-08-06 MED ORDER — MIDAZOLAM HCL 2 MG/2ML IJ SOLN
INTRAMUSCULAR | Status: AC
  Filled 2020-08-06: qty 2

## 2020-08-06 MED ORDER — SCOPOLAMINE 1 MG/3DAYS TD PT72
MEDICATED_PATCH | TRANSDERMAL | Status: DC | PRN
  Administered 2020-08-06: 1 via TRANSDERMAL

## 2020-08-06 MED ORDER — OXYCODONE HCL 5 MG/5ML PO SOLN: 5.0000 mg | Freq: Once | ORAL | Status: DC | PRN

## 2020-08-06 MED ORDER — MIDAZOLAM HCL 5 MG/5ML IJ SOLN
INTRAMUSCULAR | Status: DC | PRN
  Administered 2020-08-06: 2 mg via INTRAVENOUS

## 2020-08-06 MED ORDER — ACETAMINOPHEN 500 MG PO TABS
ORAL_TABLET | ORAL | Status: AC
  Filled 2020-08-06: qty 2

## 2020-08-06 MED ORDER — DEXAMETHASONE SODIUM PHOSPHATE 10 MG/ML IJ SOLN
INTRAMUSCULAR | Status: DC | PRN
  Administered 2020-08-06: 10 mg via INTRAVENOUS

## 2020-08-06 MED ORDER — LACTATED RINGERS IV SOLN: INTRAVENOUS | Status: DC

## 2020-08-06 MED ORDER — LIDOCAINE 2% (20 MG/ML) 5 ML SYRINGE
INTRAMUSCULAR | Status: AC
  Filled 2020-08-06: qty 10

## 2020-08-06 MED ORDER — KETOROLAC TROMETHAMINE 30 MG/ML IJ SOLN
INTRAMUSCULAR | Status: AC
  Filled 2020-08-06: qty 1

## 2020-08-06 MED ORDER — SCOPOLAMINE 1 MG/3DAYS TD PT72
MEDICATED_PATCH | TRANSDERMAL | Status: AC
  Filled 2020-08-06: qty 1

## 2020-08-06 MED ORDER — DEXAMETHASONE SODIUM PHOSPHATE 10 MG/ML IJ SOLN
INTRAMUSCULAR | Status: AC
  Filled 2020-08-06: qty 1

## 2020-08-06 MED ORDER — FENTANYL CITRATE (PF) 100 MCG/2ML IJ SOLN
25.0000 ug | INTRAMUSCULAR | Status: DC | PRN
  Administered 2020-08-06: 25 ug via INTRAVENOUS
  Administered 2020-08-06: 50 ug via INTRAVENOUS

## 2020-08-06 MED ORDER — POVIDONE-IODINE 10 % EX SWAB: 2.0000 "application " | Freq: Once | CUTANEOUS | Status: DC

## 2020-08-06 MED ORDER — FENTANYL CITRATE (PF) 100 MCG/2ML IJ SOLN
INTRAMUSCULAR | Status: AC
  Filled 2020-08-06: qty 4

## 2020-08-06 MED ORDER — PROPOFOL 10 MG/ML IV BOLUS
INTRAVENOUS | Status: AC
  Filled 2020-08-06: qty 20

## 2020-08-06 MED ORDER — KETOROLAC TROMETHAMINE 30 MG/ML IJ SOLN: 30.0000 mg | Freq: Once | INTRAMUSCULAR | Status: AC | PRN

## 2020-08-06 MED ORDER — SODIUM CHLORIDE 0.9 % IR SOLN
Status: DC | PRN
  Administered 2020-08-06: 1000 mL

## 2020-08-06 MED ORDER — FENTANYL CITRATE (PF) 100 MCG/2ML IJ SOLN
INTRAMUSCULAR | Status: AC
  Filled 2020-08-06: qty 2

## 2020-08-06 MED ORDER — FENTANYL CITRATE (PF) 100 MCG/2ML IJ SOLN
INTRAMUSCULAR | Status: DC | PRN
Start: 2020-08-06 — End: 2020-08-06
  Administered 2020-08-06 (×3): 50 ug via INTRAVENOUS

## 2020-08-06 MED ORDER — ACETAMINOPHEN 500 MG PO TABS: 1000.0000 mg | ORAL_TABLET | Freq: Once | ORAL | Status: DC

## 2020-08-06 MED ORDER — LIDOCAINE 2% (20 MG/ML) 5 ML SYRINGE
INTRAMUSCULAR | Status: DC | PRN
  Administered 2020-08-06: 100 mg via INTRAVENOUS

## 2020-08-06 MED ORDER — ACETAMINOPHEN 500 MG PO TABS
1000.0000 mg | ORAL_TABLET | Freq: Once | ORAL | Status: AC
  Administered 2020-08-06: 1000 mg via ORAL

## 2020-08-06 MED ORDER — ONDANSETRON HCL 4 MG/2ML IJ SOLN
INTRAMUSCULAR | Status: AC
  Filled 2020-08-06: qty 4

## 2020-08-06 MED ORDER — KETOROLAC TROMETHAMINE 30 MG/ML IJ SOLN
INTRAMUSCULAR | Status: DC | PRN
  Administered 2020-08-06: 30 mg via INTRAVENOUS

## 2020-08-06 MED ORDER — ONDANSETRON HCL 4 MG/2ML IJ SOLN
INTRAMUSCULAR | Status: DC | PRN
  Administered 2020-08-06 (×2): 4 mg via INTRAVENOUS

## 2020-08-06 SURGICAL SUPPLY — 18 items
COVER WAND RF STERILE (DRAPES) ×4 IMPLANT
DRSG PAD ABDOMINAL 8X10 ST (GAUZE/BANDAGES/DRESSINGS) ×4 IMPLANT
GLOVE BIO SURGEON STRL SZ 6.5 (GLOVE) ×3 IMPLANT
GLOVE BIO SURGEON STRL SZ7.5 (GLOVE) ×8 IMPLANT
GLOVE BIO SURGEONS STRL SZ 6.5 (GLOVE) ×1
GLOVE BIOGEL PI IND STRL 7.0 (GLOVE) ×4 IMPLANT
GLOVE BIOGEL PI INDICATOR 7.0 (GLOVE) ×4
GOWN STRL REUS W/TWL LRG LVL3 (GOWN DISPOSABLE) ×8 IMPLANT
HOLDER FOLEY CATH W/STRAP (MISCELLANEOUS) ×4 IMPLANT
HOVERMATT SINGLE USE (MISCELLANEOUS) ×4 IMPLANT
IV NS 1000ML (IV SOLUTION) ×4
IV NS 1000ML BAXH (IV SOLUTION) ×2 IMPLANT
IV SET EXTENSION GRAVITY 40 LF (IV SETS) ×4 IMPLANT
KIT TURNOVER CYSTO (KITS) ×4 IMPLANT
PACK VAGINAL MINOR WOMEN LF (CUSTOM PROCEDURE TRAY) ×4 IMPLANT
TOWEL OR 17X26 10 PK STRL BLUE (TOWEL DISPOSABLE) ×4 IMPLANT
TRAY FOLEY W/BAG SLVR 14FR (SET/KITS/TRAYS/PACK) ×4 IMPLANT
WATER STERILE IRR 500ML POUR (IV SOLUTION) ×4 IMPLANT

## 2020-08-06 NOTE — Anesthesia Postprocedure Evaluation (Signed)
Anesthesia Post Note  Patient: Kelly Hanson  Procedure(s) Performed: TANDEM RING INSERTION (N/A Vagina ) OPERATIVE ULTRASOUND (N/A Abdomen)     Patient location during evaluation: PACU Anesthesia Type: General Level of consciousness: awake and alert and oriented Pain management: pain level controlled Vital Signs Assessment: post-procedure vital signs reviewed and stable Respiratory status: spontaneous breathing, nonlabored ventilation and respiratory function stable Cardiovascular status: blood pressure returned to baseline Postop Assessment: no apparent nausea or vomiting Anesthetic complications: no   No complications documented.  Last Vitals:  Vitals:   08/06/20 1130 08/06/20 1145  BP: (!) 158/89 (!) 154/81  Pulse: 71 73  Resp: 10 11  Temp:    SpO2: 100% 99%    Last Pain:  Vitals:   08/06/20 1145  TempSrc:   PainSc: [place]

## 2020-08-06 NOTE — H&P (View-Only) (Signed)
Radiation Oncology         [phone] ________________________________  History and physical examination  Name: Kelly Hanson MRN: [id number]  Date: 08/06/2020  DOB: January 04, 1961  EP:PIRJJOA, Anderson Malta, MD  No ref. provider found   REFERRING PHYSICIAN: No ref. provider found  DIAGNOSIS: The primary encounter diagnosis was Malignant neoplasm of cervix, unspecified site [facility]). Diagnoses of Cervical cancer, FIGO stage IIB (HCC) and Cervical cancer, FIGO stage IIB (HCC) were also pertinent to this visit.   Clinical stage IIB squamous cell carcinoma of the cervix  HISTORY OF PRESENT ILLNESS::Kelly Hanson is a 59 y.o. female who is seen as a courtesy of Dr Berline Lopes for an opinion concerning radiation therapy as part of management of the patient's recently diagnosed locally advanced squamous cell carcinoma of the cervix. The patient presented to her PCP in March of 2021 for evaluation of 6-8 month history of intermittent postmenopausal vaginal bleeding. A PAP smear was performed at that time, which showed a high-grade squamous intraepithelial lesion. She was then referred to a gynecologist and underwent an EMB/LEEP on 05/03/2020 and a transvaginal ultrasound for work-up of her postmenopausal bleeding and high-grade cervical dysplasia. Pathology revealed squamous cell carcinoma of the cervix.  The patient was referred to Dr. Berline Lopes and was seen in consultation on 05/14/2020. At that time, they discussed treatment options including surgery and radiation therapy. Given that the tumor was noted to replace most of the cervix and there was thickening of the parametrial tissue bilaterally just adjacent to and behind the cervix, the patient was noted to not be a candidate for primary surgical treatment. Thus, they discussed the plan for PET/CT scan followed by primary radiation with sensitizing Cisplatin.  CT scan of abdomen/pelvis on 05/18/2020 showed a small amount of gas either along the cervical canal or the  adjacent fornix. A well-defined mass was not seen. There was also noted to be chronic bilateral pars defects at L5 with 7 mm grade 1 anterolisthesis of L5 on S1 and resulting mild left foraminal stenosis. Additionally, there was a suspected right foraminal disc protrusion at L3-4 that was possibly causing mild right foraminal impingement. Fortunately, there were no findings of metastatic disease to the abdomen or pelvis.  PET scan showed intense activity in the region of the cervix without any metastatic spread.  Patient has recently completed weeks of external beam radiation therapy along with radiosensitizing chemotherapy.  She completed her first brachytherapy procedure on July 26, 2020.  She tolerated this well.  Patient is now taken to the operating room for placement of tandem ring in preparation for her second high-dose-rate treatment using iridium 192 is the high-dose-rate source  PREVIOUS RADIATION THERAPY: No  PAST MEDICAL HISTORY:  Past Medical History:  Diagnosis Date  . Anxiety   . Cervical cancer ([place])   . Obesity (BMI 30-39.9)   . Palpitations     PAST SURGICAL HISTORY: Past Surgical History:  Procedure Laterality Date  . IR IMAGING GUIDED PORT INSERTION  06/03/2020  . OPERATIVE ULTRASOUND N/A 07/26/2020   Procedure: OPERATIVE ULTRASOUND;  Surgeon: Gery Pray, MD;  Location: [facility];  Service: Urology;  Laterality: N/A;  . TANDEM RING INSERTION  07/26/2020  . TANDEM RING INSERTION N/A 07/26/2020   Procedure: TANDEM RING INSERTION FOR HIGH DOSE RADIATION THERAPY;  Surgeon: Gery Pray, MD;  Location: [facility];  Service: Urology;  Laterality: N/A;  . THERAPEUTIC ABORTION      FAMILY HISTORY:  Family History  Problem Relation Age  of Onset  . Stroke Maternal Grandmother   . Colon cancer Neg Hx   . Breast cancer Neg Hx   . Endometrial cancer Neg Hx   . Ovarian cancer Neg Hx     SOCIAL HISTORY: She lives in [place] on a farm.   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 Smoking status: Former Research scientist (life sciences)  . Smokeless tobacco: Never Used  . Tobacco comment: quit 2019  Vaping Use  . Vaping Use: Never used  Substance Use Topics  . Alcohol use: Not Currently  . Drug use: Never    ALLERGIES: No Known Allergies  MEDICATIONS:  Current Facility-Administered Medications  Medication Dose Route Frequency Provider Last Rate Last Admin  . lactated ringers infusion   Intravenous Continuous Effie Berkshire, MD 50 mL/hr at 08/06/20 0848 New Bag at 08/06/20 0848  . povidone-iodine 10 % swab 2 application  2 application Topical Once Gery Pray, MD        REVIEW OF SYSTEMS:  A 10+ POINT REVIEW OF SYSTEMS WAS OBTAINED including neurology, dermatology, psychiatry, cardiac, respiratory, lymph, extremities, GI, GU, musculoskeletal, constitutional, reproductive, HEENT.  Patient reports chronic low back pain.  She also has noticed some mild pelvic pain.  She denies any hematuria or rectal bleeding.  Appetite is good.  Energy level is good   PHYSICAL EXAM:General: Alert and oriented, in no acute distress HEENT: Head is normocephalic. Extraocular movements are intact. Neck: Neck is supple, no palpable cervical or supraclavicular lymphadenopathy. Heart: Regular in rate and rhythm with no murmurs, rubs, or gallops. Chest: Clear to auscultation bilaterally, with no rhonchi, wheezes, or rales. Abdomen: Soft, nontender, nondistended, with no rigidity or guarding. Extremities: No cyanosis or edema. Lymphatics: see Neck Exam Skin: No concerning lesions. Musculoskeletal: symmetric strength and muscle tone throughout. Neurologic: Cranial nerves II through XII are grossly intact. No obvious focalities. Speech is fluent. Coordination is intact. Psychiatric: Judgment and insight are intact. Affect is appropriate. On pelvic examination deferred to intraoperative procedure   ECOG = 1   LABORATORY DATA:  Lab Results    Component Value Date   WBC 2.9 (L) 07/19/2020   HGB 12.1 07/19/2020   HCT 35.7 (L) 07/19/2020   MCV 96.5 07/19/2020   PLT 124 (L) 07/19/2020   NEUTROABS 1.5 (L) 07/05/2020   Lab Results  Component Value Date   NA 136 07/05/2020   K 4.7 07/05/2020   CL 102 07/05/2020   CO2 23 07/05/2020   GLUCOSE 77 07/05/2020   CREATININE 0.79 07/05/2020   CALCIUM 9.5 07/05/2020      RADIOGRAPHY: US PELVIS LIMITED (TRANSABDOMINAL ONLY)  Result Date: 07/30/2020 CLINICAL DATA:  Ultrasound was provided for use by the ordering physician, and a technical charge was applied by the performing facility.  No radiologist interpretation/professional services rendered.   US Guided Needle Placement  Result Date: 07/30/2020 CLINICAL DATA:  Ultrasound was provided for use by the ordering physician, and a technical charge was applied by the performing facility.  No radiologist interpretation/professional services rendered.   [place] Intraoperative  Result Date: 07/30/2020 CLINICAL DATA:  Ultrasound was provided for use by the ordering physician, and a technical charge was applied by the performing facility.  No radiologist interpretation/professional services rendered.      IMPRESSION: Clinical stage IIB squamous cell carcinoma of the cervix  Patient is now ready to proceed with her 2nd intracavitary brachytherapy treatment.  PLAN: Patient will be taken to the operating room on August  27 at 10:15.  She will undergo exam under anesthesia and placement of tandem ring apparatus in preparation for intracavitary high-dose-rate radiation therapy using iridium 192 as the high-dose-rate source.  The patient is scheduled to receive a total of 5 high-dose-rate treatments to complete her definitive course of radiation therapy.    ------------------------------------------------  Blair Promise, PhD, MD  This document serves as a record of services personally performed by Gery Pray, MD. It was created on his behalf  by Clerance Lav, a trained medical scribe. The creation of this record is based on the scribe's personal observations and the provider's statements to them. This document has been checked and approved by the attending provider.

## 2020-08-06 NOTE — Brief Op Note (Signed)
08/06/2020  11:19 AM  PATIENT:  Kelly Hanson  59 y.o. female  PRE-OPERATIVE DIAGNOSIS:  CERVICAL CANCER  POST-OPERATIVE DIAGNOSIS:  CERVICAL CANCER  PROCEDURE:  Procedure(s): TANDEM RING INSERTION (N/A) OPERATIVE ULTRASOUND (N/A)  SURGEON:  Surgeon(s) and Role:    * Gery Pray, MD - Primary  PHYSICIAN ASSISTANT:   ASSISTANTS: none   ANESTHESIA:   general  EBL:  0 mL   BLOOD ADMINISTERED:none  DRAINS: Urinary Catheter (Foley)   LOCAL MEDICATIONS USED:  NONE  SPECIMEN:  No Specimen  DISPOSITION OF SPECIMEN:  N/A  COUNTS:  YES  TOURNIQUET:  * No tourniquets in log *  DICTATION: Patient was taken to [facility] long outpatient surgical center room 1.  A general anesthetic was applied.  patient was placed in the dorsolithotomy position. timeout was performed for the procedure preoperative medications and estimated time for surgery.  Patient was prepped and draped in the usual sterile fashion.  A Foley catheter was placed and then backfilled with approximately 300 cc of sterile water for ultrasound imaging purposes.  On exam under anesthesia the patient was noted to have an excellent response to her external beam radiation therapy and radiosensitizing chemotherapy.  There was no parametrial extension on intraoperative examination and no obvious extension into the vaginal region.  The cervix was estimated to be approximately 3 x 3 cm.  The 40 mm cervical sleeve placed with her first OR procedure remained in place.  This was removed and the Patient proceeded to undergo serial dilation of the cervical os.   The uterus was anteverted and sounded to approximately 6 cm.  Overall the uterus was small in size.  Patient then underwent placement of a 45 degree  tandem with a  cervical sleeve in place.  The length of the tandem portion was 60 mm.  This longer tandem will allow for improved dosimetric  Planning.  She then had a 45 degree cervical ring placed along the cervix with a large shielding  cap In place.  This was then followed by a rectal paddle to complete the full assembly of the high-dose-rate equipment.  Intraoperative ultrasound there was good placement of the tandem within the endocervical and endometrial cavity.  Patient tolerated the procedure well.  She was subsequently taken to the recovery room in sterile condition.  Later in the day the patient will be taken down to radiation oncology for planning and her second high-dose-rate treatment.  She is scheduled to receive a total of 5 high-dose-rate treatments.  PLAN OF CARE: Transferred to radiation oncology for planning and treatment  PATIENT DISPOSITION:  PACU - hemodynamically stable.   Delay start of Pharmacological VTE agent (>24hrs) due to surgical blood loss or risk of bleeding: not applicable

## 2020-08-06 NOTE — Discharge Instructions (Signed)

## 2020-08-06 NOTE — Anesthesia Procedure Notes (Signed)
Procedure Name: LMA Insertion Date/Time: 08/06/2020 10:17 AM Performed by: Gwyndolyn Saxon, CRNA Pre-anesthesia Checklist: Patient identified, Emergency Drugs available, Suction available and Patient being monitored Patient Re-evaluated:Patient Re-evaluated prior to induction Oxygen Delivery Method: Circle system utilized Preoxygenation: Pre-oxygenation with 100% oxygen Induction Type: IV induction Ventilation: Mask ventilation without difficulty LMA: LMA inserted LMA Size: 4.0 Number of attempts: 1 Airway Equipment and Method: Patient positioned with wedge pillow Placement Confirmation: positive ETCO2 and breath sounds checked- equal and bilateral Tube secured with: Tape Dental Injury: Teeth and Oropharynx as per pre-operative assessment

## 2020-08-06 NOTE — H&P (View-Only) (Signed)
Radiation Oncology         [phone] ________________________________  History and physical examination  Name: Kelly Hanson MRN: [id number]  Date: 08/06/2020  DOB: 1961-03-16  MO:QHUTMLY, Anderson Malta, MD  No ref. provider found   REFERRING PHYSICIAN: No ref. provider found  DIAGNOSIS: The primary encounter diagnosis was Malignant neoplasm of cervix, unspecified site [facility]). Diagnoses of Cervical cancer, FIGO stage IIB (HCC) and Cervical cancer, FIGO stage IIB (HCC) were also pertinent to this visit.   Clinical stage IIB squamous cell carcinoma of the cervix  HISTORY OF PRESENT ILLNESS::Kelly Hanson is a 59 y.o. female who is seen as a courtesy of Dr Berline Lopes for an opinion concerning radiation therapy as part of management of the patient's recently diagnosed locally advanced squamous cell carcinoma of the cervix. The patient presented to her PCP in March of 2021 for evaluation of 6-8 month history of intermittent postmenopausal vaginal bleeding. A PAP smear was performed at that time, which showed a high-grade squamous intraepithelial lesion. She was then referred to a gynecologist and underwent an EMB/LEEP on 05/03/2020 and a transvaginal ultrasound for work-up of her postmenopausal bleeding and high-grade cervical dysplasia. Pathology revealed squamous cell carcinoma of the cervix.  The patient was referred to Dr. Berline Lopes and was seen in consultation on 05/14/2020. At that time, they discussed treatment options including surgery and radiation therapy. Given that the tumor was noted to replace most of the cervix and there was thickening of the parametrial tissue bilaterally just adjacent to and behind the cervix, the patient was noted to not be a candidate for primary surgical treatment. Thus, they discussed the plan for PET/CT scan followed by primary radiation with sensitizing Cisplatin.  CT scan of abdomen/pelvis on 05/18/2020 showed a small amount of gas either along the cervical canal or the  adjacent fornix. A well-defined mass was not seen. There was also noted to be chronic bilateral pars defects at L5 with 7 mm grade 1 anterolisthesis of L5 on S1 and resulting mild left foraminal stenosis. Additionally, there was a suspected right foraminal disc protrusion at L3-4 that was possibly causing mild right foraminal impingement. Fortunately, there were no findings of metastatic disease to the abdomen or pelvis.  PET scan showed intense activity in the region of the cervix without any metastatic spread.  Patient has recently completed weeks of external beam radiation therapy along with radiosensitizing chemotherapy.  She completed her first brachytherapy procedure on July 26, 2020.  She tolerated this well.  Patient is now taken to the operating room for placement of tandem ring in preparation for her second high-dose-rate treatment using iridium 192 is the high-dose-rate source  PREVIOUS RADIATION THERAPY: No  PAST MEDICAL HISTORY:  Past Medical History:  Diagnosis Date  . Anxiety   . Cervical cancer ([place])   . Obesity (BMI 30-39.9)   . Palpitations     PAST SURGICAL HISTORY: Past Surgical History:  Procedure Laterality Date  . IR IMAGING GUIDED PORT INSERTION  06/03/2020  . OPERATIVE ULTRASOUND N/A 07/26/2020   Procedure: OPERATIVE ULTRASOUND;  Surgeon: Gery Pray, MD;  Location: [facility];  Service: Urology;  Laterality: N/A;  . TANDEM RING INSERTION  07/26/2020  . TANDEM RING INSERTION N/A 07/26/2020   Procedure: TANDEM RING INSERTION FOR HIGH DOSE RADIATION THERAPY;  Surgeon: Gery Pray, MD;  Location: [facility];  Service: Urology;  Laterality: N/A;  . THERAPEUTIC ABORTION      FAMILY HISTORY:  Family History  Problem Relation Age  of Onset  . Stroke Maternal Grandmother   . Colon cancer Neg Hx   . Breast cancer Neg Hx   . Endometrial cancer Neg Hx   . Ovarian cancer Neg Hx     SOCIAL HISTORY: She lives in [place] on a farm.   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 Smoking status: Former Research scientist (life sciences)  . Smokeless tobacco: Never Used  . Tobacco comment: quit 2019  Vaping Use  . Vaping Use: Never used  Substance Use Topics  . Alcohol use: Not Currently  . Drug use: Never    ALLERGIES: No Known Allergies  MEDICATIONS:  Current Facility-Administered Medications  Medication Dose Route Frequency Provider Last Rate Last Admin  . lactated ringers infusion   Intravenous Continuous Effie Berkshire, MD 50 mL/hr at 08/06/20 0848 New Bag at 08/06/20 0848  . povidone-iodine 10 % swab 2 application  2 application Topical Once Gery Pray, MD        REVIEW OF SYSTEMS:  A 10+ POINT REVIEW OF SYSTEMS WAS OBTAINED including neurology, dermatology, psychiatry, cardiac, respiratory, lymph, extremities, GI, GU, musculoskeletal, constitutional, reproductive, HEENT.  Patient reports chronic low back pain.  She also has noticed some mild pelvic pain.  She denies any hematuria or rectal bleeding.  Appetite is good.  Energy level is good   PHYSICAL EXAM:General: Alert and oriented, in no acute distress HEENT: Head is normocephalic. Extraocular movements are intact. Neck: Neck is supple, no palpable cervical or supraclavicular lymphadenopathy. Heart: Regular in rate and rhythm with no murmurs, rubs, or gallops. Chest: Clear to auscultation bilaterally, with no rhonchi, wheezes, or rales. Abdomen: Soft, nontender, nondistended, with no rigidity or guarding. Extremities: No cyanosis or edema. Lymphatics: see Neck Exam Skin: No concerning lesions. Musculoskeletal: symmetric strength and muscle tone throughout. Neurologic: Cranial nerves II through XII are grossly intact. No obvious focalities. Speech is fluent. Coordination is intact. Psychiatric: Judgment and insight are intact. Affect is appropriate. On pelvic examination deferred to intraoperative procedure   ECOG = 1   LABORATORY DATA:  Lab Results    Component Value Date   WBC 2.9 (L) 07/19/2020   HGB 12.1 07/19/2020   HCT 35.7 (L) 07/19/2020   MCV 96.5 07/19/2020   PLT 124 (L) 07/19/2020   NEUTROABS 1.5 (L) 07/05/2020   Lab Results  Component Value Date   NA 136 07/05/2020   K 4.7 07/05/2020   CL 102 07/05/2020   CO2 23 07/05/2020   GLUCOSE 77 07/05/2020   CREATININE 0.79 07/05/2020   CALCIUM 9.5 07/05/2020      RADIOGRAPHY: US PELVIS LIMITED (TRANSABDOMINAL ONLY)  Result Date: 07/30/2020 CLINICAL DATA:  Ultrasound was provided for use by the ordering physician, and a technical charge was applied by the performing facility.  No radiologist interpretation/professional services rendered.   US Guided Needle Placement  Result Date: 07/30/2020 CLINICAL DATA:  Ultrasound was provided for use by the ordering physician, and a technical charge was applied by the performing facility.  No radiologist interpretation/professional services rendered.   [place] Intraoperative  Result Date: 07/30/2020 CLINICAL DATA:  Ultrasound was provided for use by the ordering physician, and a technical charge was applied by the performing facility.  No radiologist interpretation/professional services rendered.      IMPRESSION: Clinical stage IIB squamous cell carcinoma of the cervix  Patient is now ready to proceed with her 2nd intracavitary brachytherapy treatment.  PLAN: Patient will be taken to the operating room on August  27 at 10:15.  She will undergo exam under anesthesia and placement of tandem ring apparatus in preparation for intracavitary high-dose-rate radiation therapy using iridium 192 as the high-dose-rate source.  The patient is scheduled to receive a total of 5 high-dose-rate treatments to complete her definitive course of radiation therapy.    ------------------------------------------------  Blair Promise, PhD, MD  This document serves as a record of services personally performed by Gery Pray, MD. It was created on his behalf  by Clerance Lav, a trained medical scribe. The creation of this record is based on the scribe's personal observations and the provider's statements to them. This document has been checked and approved by the attending provider.

## 2020-08-06 NOTE — H&P (Signed)
Radiation Oncology         (336) [phone] ________________________________  History and physical examination  Name: Kelly Hanson MRN: [id number]  Date: 08/06/2020  DOB: 11-14-1961  WU:[id number], Anderson Malta, MD  No ref. provider found   REFERRING PHYSICIAN: No ref. provider found  DIAGNOSIS: The primary encounter diagnosis was Malignant neoplasm of cervix, unspecified site [facility]). Diagnoses of Cervical cancer, FIGO stage IIB (HCC) and Cervical cancer, FIGO stage IIB (HCC) were also pertinent to this visit.   Clinical stage IIB squamous cell carcinoma of the cervix  HISTORY OF PRESENT ILLNESS::Kelly Hanson is a 59 y.o. female who is seen as a courtesy of Dr Berline Lopes for an opinion concerning radiation therapy as part of management of the patient's recently diagnosed locally advanced squamous cell carcinoma of the cervix. The patient presented to her PCP in March of 2021 for evaluation of 6-8 month history of intermittent postmenopausal vaginal bleeding. A PAP smear was performed at that time, which showed a high-grade squamous intraepithelial lesion. She was then referred to a gynecologist and underwent an EMB/LEEP on 05/03/2020 and a transvaginal ultrasound for work-up of her postmenopausal bleeding and high-grade cervical dysplasia. Pathology revealed squamous cell carcinoma of the cervix.  The patient was referred to Dr. Berline Lopes and was seen in consultation on 05/14/2020. At that time, they discussed treatment options including surgery and radiation therapy. Given that the tumor was noted to replace most of the cervix and there was thickening of the parametrial tissue bilaterally just adjacent to and behind the cervix, the patient was noted to not be a candidate for primary surgical treatment. Thus, they discussed the plan for PET/CT scan followed by primary radiation with sensitizing Cisplatin.  CT scan of abdomen/pelvis on 05/18/2020 showed a small amount of gas either along the cervical canal or the  adjacent fornix. A well-defined mass was not seen. There was also noted to be chronic bilateral pars defects at L5 with 7 mm grade 1 anterolisthesis of L5 on S1 and resulting mild left foraminal stenosis. Additionally, there was a suspected right foraminal disc protrusion at L3-4 that was possibly causing mild right foraminal impingement. Fortunately, there were no findings of metastatic disease to the abdomen or pelvis.  PET scan showed intense activity in the region of the cervix without any metastatic spread.  Patient has recently completed weeks of external beam radiation therapy along with radiosensitizing chemotherapy.  She completed her first brachytherapy procedure on July 26, 2020.  She tolerated this well.  Patient is now taken to the operating room for placement of tandem ring in preparation for her second high-dose-rate treatment using iridium 192 is the high-dose-rate source  PREVIOUS RADIATION THERAPY: No  PAST MEDICAL HISTORY:  Past Medical History:  Diagnosis Date  . Anxiety   . Cervical cancer ([place])   . Obesity (BMI 30-39.9)   . Palpitations     PAST SURGICAL HISTORY: Past Surgical History:  Procedure Laterality Date  . IR IMAGING GUIDED PORT INSERTION  06/03/2020  . OPERATIVE ULTRASOUND N/A 07/26/2020   Procedure: OPERATIVE ULTRASOUND;  Surgeon: Gery Pray, MD;  Location: [facility];  Service: Urology;  Laterality: N/A;  . TANDEM RING INSERTION  07/26/2020  . TANDEM RING INSERTION N/A 07/26/2020   Procedure: TANDEM RING INSERTION FOR HIGH DOSE RADIATION THERAPY;  Surgeon: Gery Pray, MD;  Location: [facility];  Service: Urology;  Laterality: N/A;  . THERAPEUTIC ABORTION      FAMILY HISTORY:  Family History  Problem Relation Age  of Onset  . Stroke Maternal Grandmother   . Colon cancer Neg Hx   . Breast cancer Neg Hx   . Endometrial cancer Neg Hx   . Ovarian cancer Neg Hx     SOCIAL HISTORY: She lives in [place] on a farm.   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 Smoking status: Former Research scientist (life sciences)  . Smokeless tobacco: Never Used  . Tobacco comment: quit 2019  Vaping Use  . Vaping Use: Never used  Substance Use Topics  . Alcohol use: Not Currently  . Drug use: Never    ALLERGIES: No Known Allergies  MEDICATIONS:  Current Facility-Administered Medications  Medication Dose Route Frequency Provider Last Rate Last Admin  . lactated ringers infusion   Intravenous Continuous Effie Berkshire, MD 50 mL/hr at 08/06/20 0848 New Bag at 08/06/20 0848  . povidone-iodine 10 % swab 2 application  2 application Topical Once Gery Pray, MD        REVIEW OF SYSTEMS:  A 10+ POINT REVIEW OF SYSTEMS WAS OBTAINED including neurology, dermatology, psychiatry, cardiac, respiratory, lymph, extremities, GI, GU, musculoskeletal, constitutional, reproductive, HEENT.  Patient reports chronic low back pain.  She also has noticed some mild pelvic pain.  She denies any hematuria or rectal bleeding.  Appetite is good.  Energy level is good   PHYSICAL EXAM:General: Alert and oriented, in no acute distress HEENT: Head is normocephalic. Extraocular movements are intact. Neck: Neck is supple, no palpable cervical or supraclavicular lymphadenopathy. Heart: Regular in rate and rhythm with no murmurs, rubs, or gallops. Chest: Clear to auscultation bilaterally, with no rhonchi, wheezes, or rales. Abdomen: Soft, nontender, nondistended, with no rigidity or guarding. Extremities: No cyanosis or edema. Lymphatics: see Neck Exam Skin: No concerning lesions. Musculoskeletal: symmetric strength and muscle tone throughout. Neurologic: Cranial nerves II through XII are grossly intact. No obvious focalities. Speech is fluent. Coordination is intact. Psychiatric: Judgment and insight are intact. Affect is appropriate. On pelvic examination deferred to intraoperative procedure   ECOG = 1   LABORATORY DATA:  Lab Results    Component Value Date   WBC 2.9 (L) 07/19/2020   HGB 12.1 07/19/2020   HCT 35.7 (L) 07/19/2020   MCV 96.5 07/19/2020   PLT 124 (L) 07/19/2020   NEUTROABS 1.5 (L) 07/05/2020   Lab Results  Component Value Date   NA 136 07/05/2020   K 4.7 07/05/2020   CL 102 07/05/2020   CO2 23 07/05/2020   GLUCOSE 77 07/05/2020   CREATININE 0.79 07/05/2020   CALCIUM 9.5 07/05/2020      RADIOGRAPHY: US PELVIS LIMITED (TRANSABDOMINAL ONLY)  Result Date: 07/30/2020 CLINICAL DATA:  Ultrasound was provided for use by the ordering physician, and a technical charge was applied by the performing facility.  No radiologist interpretation/professional services rendered.   US Guided Needle Placement  Result Date: 07/30/2020 CLINICAL DATA:  Ultrasound was provided for use by the ordering physician, and a technical charge was applied by the performing facility.  No radiologist interpretation/professional services rendered.   [place] Intraoperative  Result Date: 07/30/2020 CLINICAL DATA:  Ultrasound was provided for use by the ordering physician, and a technical charge was applied by the performing facility.  No radiologist interpretation/professional services rendered.      IMPRESSION: Clinical stage IIB squamous cell carcinoma of the cervix  Patient is now ready to proceed with her 2nd intracavitary brachytherapy treatment.  PLAN: Patient will be taken to the operating room on August  27 at 10:15.  She will undergo exam under anesthesia and placement of tandem ring apparatus in preparation for intracavitary high-dose-rate radiation therapy using iridium 192 as the high-dose-rate source.  The patient is scheduled to receive a total of 5 high-dose-rate treatments to complete her definitive course of radiation therapy.    ------------------------------------------------  Blair Promise, PhD, MD  This document serves as a record of services personally performed by Gery Pray, MD. It was created on his behalf  by Clerance Lav, a trained medical scribe. The creation of this record is based on the scribe's personal observations and the provider's statements to them. This document has been checked and approved by the attending provider.

## 2020-08-06 NOTE — Progress Notes (Addendum)
12pm Patient transferred from [facility] PACU to [facility] Sim via stretcher with Quintin Alto, NT. Patient alert  and oriented x3. IV infusing at 125 cc/hr of LR to her Left hand. IV intact. Foley intact draining clear yellow urine to the drainage bag. Skin warm and dry. Respirations regular. 1220 Patient brought to room 1 in the Radiation Clinic for monitoring and observation. Patient denies any pain and declines offers for food or PO liquids. Alternating pressure stockings attached to the pump. IV tubing attached to the Alaris IV pump and continues with rate of 125 cc/hr. Patient given call bell and side rails up bilaterally.  1230 O2 sat 99 % P 66 R 18 BP 138/85 at right arm. Pain rating 0. 1300 Patient resting with eyes open. 1345 Patient declines need for pain medicine. Pain 0. 1000 cc LR hung at 125 cc/hr  1410 RT staff transported patient to Agency via stretcher.  1450 Patient back in the radiation clinic. 1500 Foley d/c'd tip intact. BP 16/195 P 80 R 17 O2 sat 99% 1510 Tandem ring removed by Dr. Sondra Come. 1520 IV removed from left hand with tip intact. 1730 Patient discharged by w/c to home with her husband.  Foley drained 600 cc of urine and bag emptied prior to removing. Total IV fluid 300 cc of LR prior to IV removal.     IMMEDIATELY FOLLOWING SURGERY: Do not drive or operate machinery for the first twenty four hours after surgery. Do not make any important decisions for twenty four hours after surgery or while taking narcotic pain medications or sedatives. If you develop intractable nausea and vomiting or a severe headache please notify your doctor immediately.   FOLLOW-UP: You do not need to follow up with anesthesia unless specifically instructed to do so.   WOUND CARE INSTRUCTIONS (if applicable): Expect some mild vaginal bleeding, but if large amount of bleeding occurs please contact Dr. Sondra Come at [phone] or the Radiation On-Call physician. Call for any fever greater than 101.0 degrees or  increasing vaginal//abdominal pain or trouble urinating.   QUESTIONS?: Please feel free to call your physician or the hospital operator if you have any questions, and they will be happy to assist you. Resume all medications: as listed on your after visit summary. Your next appointment is:  Future Appointments  Date Time Provider [place]  08/06/2020  3:30 PM Gery Pray, MD [facility] None  08/09/2020  7:00 AM WL-US 1 WL-US [place]  08/09/2020  9:00 AM Gery Pray, MD [facility] None  08/09/2020 11:00 AM Gery Pray, MD [facility] None  08/17/2020 10:00 AM WL-US 1 WL-US [place]  08/17/2020  1:00 PM Gery Pray, MD [place] None  08/17/2020  3:00 PM Gery Pray, MD [facility] None  08/23/2020 11:00 AM WL-US 1 WL-US   08/23/2020  1:00 PM Gery Pray, MD [place] None  08/23/2020  3:00 PM Gery Pray, MD [facility] None  09/03/2020  9:15 AM [facility] LAB [facility] None  09/03/2020  9:30 AM [facility] [place] FLUSH [facility] None  09/03/2020 10:00 AM Heath Lark, MD [facility] None

## 2020-08-06 NOTE — Progress Notes (Signed)
Spoke w/ via phone for pre-op interview---PT Lab needs dos---- none              Lab results------none COVID test ------not needed pt to quarantine for 08-09-2020 surgery per [facility]on Arrive at -------530 am 08-09-2020 NPO after MN NO Solid Food.  Clear liquids from MN until---430 am then npo Medications to take morning of surgery -----none Diabetic medication -----n/a Patient Special Instructions -----none Pre-Op special Istructions -----none Patient verbalized understanding of instructions that were given at this phone interview. Patient denies shortness of breath, chest pain, fever, cough at this phone interview.

## 2020-08-06 NOTE — Interval H&P Note (Signed)
History and Physical Interval Note:  08/06/2020 9:57 AM  Kelly Hanson  has presented today for surgery, with the diagnosis of CERVICAL CANCER.  The various methods of treatment have been discussed with the patient and family. After consideration of risks, benefits and other options for treatment, the patient has consented to  Procedure(s): TANDEM RING INSERTION (N/A) OPERATIVE ULTRASOUND (N/A) as a surgical intervention.  The patient's history has been reviewed, patient examined, no change in status, stable for surgery.  I have reviewed the patient's chart and labs.  Questions were answered to the patient's satisfaction.     Gery Pray

## 2020-08-06 NOTE — Progress Notes (Signed)
  Radiation Oncology         [phone]) [phone] ________________________________  Name: Kelly Hanson MRN: [id number]  Date: 08/06/2020  DOB: 05/01/1961  CC: [name], MD  [name], MD  HDR BRACHYTHERAPY NOTE  DIAGNOSIS: Clinical stage IIB squamous cell carcinoma of the cervix  NARRATIVE: The patient was brought to the [place] suite. Identity was confirmed. All relevant records and images related to the planned course of therapy were reviewed. The patient freely provided informed written consent to proceed with treatment after reviewing the details related to the planned course of therapy. The consent form was witnessed and verified by the simulation staff. Then, the patient was set-up in a stable reproducible supine position for radiation therapy. The tandem ring system was accessed and fiducial markers were placed within the tandem and ring.   Simple treatment device note: On the operating room the patient had construction of her custom tandem ring system. She will be treated with a 45 tandem/ring system. The patient had placement of a 60 mm tandem. A cervical ring with a large shielding was used for her treatment. A rectal paddle was also part of her custom set up device.  Verification simulation note: An AP and lateral film was obtained through the pelvis area. This was compared to the patient's planning films documenting accurate position of the tandem/ring system for treatment.  High-dose-rate brachytherapy treatment note:  The remote afterloading device was accessed through catheter system and attached to the tandem ring system. Patient then proceeded to undergo her second high-dose-rate treatment directed at the cervix. The patient was prescribed a dose of 5.5 gray to be delivered to the high risk clinical target volume. Patient was treated with 2 channels using 26 dwell positions. Treatment time was 516.4 seconds. The patient tolerated the procedure well. After completion of her  therapy, a radiation survey was performed documenting return of the iridium source into the GammaMed safe. The patient was then transferred to the nursing suite. She then had removal of the rectal paddle followed by the tandem and ring system. The patient tolerated the removal well.  PLAN: The patient will return next week for her third high-dose-rate treatment. ________________________________    Blair Promise, PhD, MD  This document serves as a record of services personally performed by Gery Pray, MD. It was created on his behalf by Clerance Lav, a trained medical scribe. The creation of this record is based on the scribe's personal observations and the provider's statements to them. This document has been checked and approved by the attending provider.

## 2020-08-06 NOTE — H&P (View-Only) (Signed)
Radiation Oncology         (336) [phone] ________________________________  History and physical examination  Name: Kelly Hanson MRN: [id number]  Date: 08/06/2020  DOB: [birth year]  OE:UMPNTIR, Anderson Malta, MD  No ref. provider found   REFERRING PHYSICIAN: No ref. provider found  DIAGNOSIS: The primary encounter diagnosis was Malignant neoplasm of cervix, unspecified site [facility]). Diagnoses of Cervical cancer, FIGO stage IIB (HCC) and Cervical cancer, FIGO stage IIB (HCC) were also pertinent to this visit.   Clinical stage IIB squamous cell carcinoma of the cervix  HISTORY OF PRESENT ILLNESS::Kelly Hanson is a 59 y.o. female who is seen as a courtesy of Dr Berline Lopes for an opinion concerning radiation therapy as part of management of the patient's recently diagnosed locally advanced squamous cell carcinoma of the cervix. The patient presented to her PCP in March of 2021 for evaluation of 6-8 month history of intermittent postmenopausal vaginal bleeding. A PAP smear was performed at that time, which showed a high-grade squamous intraepithelial lesion. She was then referred to a gynecologist and underwent an EMB/LEEP on 05/03/2020 and a transvaginal ultrasound for work-up of her postmenopausal bleeding and high-grade cervical dysplasia. Pathology revealed squamous cell carcinoma of the cervix.  The patient was referred to Dr. Berline Lopes and was seen in consultation on 05/14/2020. At that time, they discussed treatment options including surgery and radiation therapy. Given that the tumor was noted to replace most of the cervix and there was thickening of the parametrial tissue bilaterally just adjacent to and behind the cervix, the patient was noted to not be a candidate for primary surgical treatment. Thus, they discussed the plan for PET/CT scan followed by primary radiation with sensitizing Cisplatin.  CT scan of abdomen/pelvis on 05/18/2020 showed a small amount of gas either along the cervical canal or the  adjacent fornix. A well-defined mass was not seen. There was also noted to be chronic bilateral pars defects at L5 with 7 mm grade 1 anterolisthesis of L5 on S1 and resulting mild left foraminal stenosis. Additionally, there was a suspected right foraminal disc protrusion at L3-4 that was possibly causing mild right foraminal impingement. Fortunately, there were no findings of metastatic disease to the abdomen or pelvis.  PET scan showed intense activity in the region of the cervix without any metastatic spread.  Patient has recently completed weeks of external beam radiation therapy along with radiosensitizing chemotherapy.  She completed her first brachytherapy procedure on July 26, 2020.  She tolerated this well.  Patient is now taken to the operating room for placement of tandem ring in preparation for her second high-dose-rate treatment using iridium 192 is the high-dose-rate source  PREVIOUS RADIATION THERAPY: No  PAST MEDICAL HISTORY:  Past Medical History:  Diagnosis Date  . Anxiety   . Cervical cancer ([place])   . Obesity (BMI 30-39.9)   . Palpitations     PAST SURGICAL HISTORY: Past Surgical History:  Procedure Laterality Date  . IR IMAGING GUIDED PORT INSERTION  06/03/2020  . OPERATIVE ULTRASOUND N/A 07/26/2020   Procedure: OPERATIVE ULTRASOUND;  Surgeon: Gery Pray, MD;  [facility];  Service: Urology;  Laterality: N/A;  . TANDEM RING INSERTION  07/26/2020  . TANDEM RING INSERTION N/A 07/26/2020   Procedure: TANDEM RING INSERTION FOR HIGH DOSE RADIATION THERAPY;  Surgeon: Gery Pray, MD;  Location: [facility];  Service: Urology;  Laterality: N/A;  . THERAPEUTIC ABORTION      FAMILY HISTORY:  Family History  Problem Relation Age  of Onset  . Stroke Maternal Grandmother   . Colon cancer Neg Hx   . Breast cancer Neg Hx   . Endometrial cancer Neg Hx   . Ovarian cancer Neg Hx     SOCIAL HISTORY: She lives in [place] on a farm.   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 Smoking status: Former Research scientist (life sciences)  . Smokeless tobacco: Never Used  . Tobacco comment: quit 2019  Vaping Use  . Vaping Use: Never used  Substance Use Topics  . Alcohol use: Not Currently  . Drug use: Never    ALLERGIES: No Known Allergies  MEDICATIONS:  Current Facility-Administered Medications  Medication Dose Route Frequency Provider Last Rate Last Admin  . lactated ringers infusion   Intravenous Continuous Effie Berkshire, MD 50 mL/hr at 08/06/20 0848 New Bag at 08/06/20 0848  . povidone-iodine 10 % swab 2 application  2 application Topical Once Gery Pray, MD        REVIEW OF SYSTEMS:  A 10+ POINT REVIEW OF SYSTEMS WAS OBTAINED including neurology, dermatology, psychiatry, cardiac, respiratory, lymph, extremities, GI, GU, musculoskeletal, constitutional, reproductive, HEENT.  Patient reports chronic low back pain.  She also has noticed some mild pelvic pain.  She denies any hematuria or rectal bleeding.  Appetite is good.  Energy level is good   PHYSICAL EXAM:General: Alert and oriented, in no acute distress HEENT: Head is normocephalic. Extraocular movements are intact. Neck: Neck is supple, no palpable cervical or supraclavicular lymphadenopathy. Heart: Regular in rate and rhythm with no murmurs, rubs, or gallops. Chest: Clear to auscultation bilaterally, with no rhonchi, wheezes, or rales. Abdomen: Soft, nontender, nondistended, with no rigidity or guarding. Extremities: No cyanosis or edema. Lymphatics: see Neck Exam Skin: No concerning lesions. Musculoskeletal: symmetric strength and muscle tone throughout. Neurologic: Cranial nerves II through XII are grossly intact. No obvious focalities. Speech is fluent. Coordination is intact. Psychiatric: Judgment and insight are intact. Affect is appropriate. On pelvic examination deferred to intraoperative procedure   ECOG = 1   LABORATORY DATA:  Lab Results    Component Value Date   WBC 2.9 (L) 07/19/2020   HGB 12.1 07/19/2020   HCT 35.7 (L) 07/19/2020   MCV 96.5 07/19/2020   PLT 124 (L) 07/19/2020   NEUTROABS 1.5 (L) 07/05/2020   Lab Results  Component Value Date   NA 136 07/05/2020   K 4.7 07/05/2020   CL 102 07/05/2020   CO2 23 07/05/2020   GLUCOSE 77 07/05/2020   CREATININE 0.79 07/05/2020   CALCIUM 9.5 07/05/2020      RADIOGRAPHY: US PELVIS LIMITED (TRANSABDOMINAL ONLY)  Result Date: 07/30/2020 CLINICAL DATA:  Ultrasound was provided for use by the ordering physician, and a technical charge was applied by the performing facility.  No radiologist interpretation/professional services rendered.   US Guided Needle Placement  Result Date: 07/30/2020 CLINICAL DATA:  Ultrasound was provided for use by the ordering physician, and a technical charge was applied by the performing facility.  No radiologist interpretation/professional services rendered.   [place] Intraoperative  Result Date: 07/30/2020 CLINICAL DATA:  Ultrasound was provided for use by the ordering physician, and a technical charge was applied by the performing facility.  No radiologist interpretation/professional services rendered.      IMPRESSION: Clinical stage IIB squamous cell carcinoma of the cervix  Patient is now ready to proceed with her 2nd intracavitary brachytherapy treatment.  PLAN: Patient will be taken to the operating room on August  27 at 10:15.  She will undergo exam under anesthesia and placement of tandem ring apparatus in preparation for intracavitary high-dose-rate radiation therapy using iridium 192 as the high-dose-rate source.  The patient is scheduled to receive a total of 5 high-dose-rate treatments to complete her definitive course of radiation therapy.    ------------------------------------------------  Blair Promise, PhD, MD  This document serves as a record of services personally performed by Gery Pray, MD. It was created on his behalf  by Clerance Lav, a trained medical scribe. The creation of this record is based on the scribe's personal observations and the provider's statements to them. This document has been checked and approved by the attending provider.

## 2020-08-06 NOTE — Transfer of Care (Signed)
Immediate Anesthesia Transfer of Care Note  Patient: Kelly Hanson  Procedure(s) Performed: TANDEM RING INSERTION (N/A Vagina ) OPERATIVE ULTRASOUND (N/A Abdomen)  Patient Location: PACU  Anesthesia Type:General  Level of Consciousness: drowsy  Airway & Oxygen Therapy: Patient Spontanous Breathing and Patient connected to face mask oxygen  Post-op Assessment: Report given to RN and Post -op Vital signs reviewed and stable  Post vital signs: Reviewed and stable  Last Vitals:  Vitals Value Taken Time  BP 97/52 08/06/20 1053  Temp    Pulse 72 08/06/20 1056  Resp 11 08/06/20 1056  SpO2 98 % 08/06/20 1056  Vitals shown include unvalidated device data.  Last Pain:  Vitals:   08/06/20 0819  TempSrc: Oral         Complications: No complications documented.

## 2020-08-08 NOTE — Progress Notes (Signed)
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 Identity was confirmed. All relevant records and images related to the planned course of therapy were reviewed. The patient freely provided informed written consent to proceed with treatment after reviewing the details related to the planned course of therapy. The consent form was witnessed and verified by the simulation staff. Then, the patient was set-up in a stable reproducible supine position for radiation therapy. The tandem ring system was accessed and fiducial markers were placed within the tandem and ring.   Simple treatment device note: On the operating room the patient had construction of her custom tandem ring system. She will be treated with a 45 tandem/ring system. The patient had placement of a 40 mm tandem. A cervical ring with a large shielding was used for her treatment. A rectal paddle was also part of her custom set up device.  Verification simulation note: An AP and lateral film was obtained through the pelvis area. This was compared to the patient's planning films documenting accurate position of the tandem/ring system for treatment.  High-dose-rate brachytherapy treatment note:  The remote afterloading device was accessed through catheter system and attached to the tandem ring system. Patient then proceeded to undergo her third high-dose-rate treatment directed at the cervix. The patient was prescribed a dose of 5.5 gray to be delivered to the high risk clinical target volume. Patient was treated with 2 channels using 23 dwell positions. Treatment time was 474.8 seconds. The patient tolerated the procedure well. After completion of her  therapy, a radiation survey was performed documenting return of the iridium source into the GammaMed safe. The patient was then transferred to the nursing suite. She then had removal of the rectal paddle followed by the tandem and ring system. The patient tolerated the removal well.  PLAN: The patient will return next week for her fourth high-dose-rate treatment. ________________________________    Blair Promise, PhD, MD  This document serves as a record of services personally performed by Gery Pray, MD. It was created on his behalf by Clerance Lav, a trained medical scribe. The creation of this record is based on the scribe's personal observations and the provider's statements to them. This document has been checked and approved by the attending provider.

## 2020-08-09 ENCOUNTER — Ambulatory Visit (HOSPITAL_COMMUNITY)
Admission: RE | Admit: 2020-08-09 | Discharge: 2020-08-09 | Disposition: A | Discharge status: Home / Self Care | Payer: BC Managed Care – PPO | Source: Ambulatory Visit | Attending: Radiation Oncology | Admitting: Radiation Oncology

## 2020-08-09 ENCOUNTER — Other Ambulatory Visit: Payer: Self-pay

## 2020-08-09 ENCOUNTER — Ambulatory Visit (HOSPITAL_BASED_OUTPATIENT_CLINIC_OR_DEPARTMENT_OTHER): Payer: BC Managed Care – PPO | Admitting: Anesthesiology

## 2020-08-09 ENCOUNTER — Encounter (HOSPITAL_BASED_OUTPATIENT_CLINIC_OR_DEPARTMENT_OTHER)
Admission: RE | Disposition: A | Discharge status: Other Acute Inpatient Hospital | Payer: Self-pay | Source: Home / Self Care | Attending: Radiation Oncology

## 2020-08-09 ENCOUNTER — Ambulatory Visit
Admission: RE | Admit: 2020-08-09 | Discharge: 2020-08-09 | Disposition: A | Discharge status: Home / Self Care | Payer: BC Managed Care – PPO | Source: Ambulatory Visit | Attending: Radiation Oncology | Admitting: Radiation Oncology

## 2020-08-09 ENCOUNTER — Ambulatory Visit (HOSPITAL_BASED_OUTPATIENT_CLINIC_OR_DEPARTMENT_OTHER)
Admission: RE | Admit: 2020-08-09 | Discharge: 2020-08-09 | Disposition: A | Discharge status: Other Acute Inpatient Hospital | Payer: BC Managed Care – PPO | Attending: Radiation Oncology | Admitting: Radiation Oncology

## 2020-08-09 ENCOUNTER — Encounter (HOSPITAL_BASED_OUTPATIENT_CLINIC_OR_DEPARTMENT_OTHER): Payer: Self-pay | Admitting: Radiation Oncology

## 2020-08-09 DIAGNOSIS — R002 Palpitations: Secondary | ICD-10-CM | POA: Insufficient documentation

## 2020-08-09 DIAGNOSIS — C539 Malignant neoplasm of cervix uteri, unspecified: Secondary | ICD-10-CM

## 2020-08-09 DIAGNOSIS — Z823 Family history of stroke: Secondary | ICD-10-CM | POA: Insufficient documentation

## 2020-08-09 DIAGNOSIS — E669 Obesity, unspecified: Secondary | ICD-10-CM | POA: Diagnosis not present

## 2020-08-09 DIAGNOSIS — D61818 Other pancytopenia: Secondary | ICD-10-CM | POA: Diagnosis not present

## 2020-08-09 DIAGNOSIS — Z87891 Personal history of nicotine dependence: Secondary | ICD-10-CM | POA: Insufficient documentation

## 2020-08-09 DIAGNOSIS — Z6832 Body mass index (BMI) 32.0-32.9, adult: Secondary | ICD-10-CM | POA: Diagnosis not present

## 2020-08-09 DIAGNOSIS — F419 Anxiety disorder, unspecified: Secondary | ICD-10-CM | POA: Diagnosis not present

## 2020-08-09 DIAGNOSIS — C530 Malignant neoplasm of endocervix: Secondary | ICD-10-CM

## 2020-08-09 HISTORY — PX: TANDEM RING INSERTION: SHX6199

## 2020-08-09 HISTORY — PX: OPERATIVE ULTRASOUND: SHX5996

## 2020-08-09 SURGERY — INSERTION, UTERINE TANDEM AND RING OR CYLINDER, FOR BRACHYTHERAPY
Anesthesia: General | Site: Vagina

## 2020-08-09 MED ORDER — FENTANYL CITRATE (PF) 100 MCG/2ML IJ SOLN
INTRAMUSCULAR | Status: AC
  Filled 2020-08-09: qty 2

## 2020-08-09 MED ORDER — LIDOCAINE 2% (20 MG/ML) 5 ML SYRINGE
INTRAMUSCULAR | Status: AC
  Filled 2020-08-09: qty 5

## 2020-08-09 MED ORDER — DEXAMETHASONE SODIUM PHOSPHATE 10 MG/ML IJ SOLN
INTRAMUSCULAR | Status: DC | PRN
  Administered 2020-08-09: 10 mg via INTRAVENOUS

## 2020-08-09 MED ORDER — ONDANSETRON HCL 4 MG/2ML IJ SOLN
INTRAMUSCULAR | Status: DC | PRN
  Administered 2020-08-09: 4 mg via INTRAVENOUS

## 2020-08-09 MED ORDER — MEPERIDINE HCL 25 MG/ML IJ SOLN: 6.2500 mg | INTRAMUSCULAR | Status: DC | PRN

## 2020-08-09 MED ORDER — PHENYLEPHRINE 40 MCG/ML (10ML) SYRINGE FOR IV PUSH (FOR BLOOD PRESSURE SUPPORT)
PREFILLED_SYRINGE | INTRAVENOUS | Status: DC | PRN
  Administered 2020-08-09: 80 ug via INTRAVENOUS

## 2020-08-09 MED ORDER — SCOPOLAMINE 1 MG/3DAYS TD PT72
1.0000 | MEDICATED_PATCH | TRANSDERMAL | Status: DC
  Administered 2020-08-09: 1.5 mg via TRANSDERMAL

## 2020-08-09 MED ORDER — HYDROMORPHONE HCL 1 MG/ML IJ SOLN
INTRAMUSCULAR | Status: AC
  Filled 2020-08-09: qty 1

## 2020-08-09 MED ORDER — OXYCODONE HCL 5 MG PO TABS: 5.0000 mg | ORAL_TABLET | Freq: Once | ORAL | Status: DC | PRN

## 2020-08-09 MED ORDER — PROMETHAZINE HCL 25 MG/ML IJ SOLN: 6.2500 mg | INTRAMUSCULAR | Status: DC | PRN

## 2020-08-09 MED ORDER — DEXAMETHASONE SODIUM PHOSPHATE 10 MG/ML IJ SOLN
INTRAMUSCULAR | Status: AC
  Filled 2020-08-09: qty 1

## 2020-08-09 MED ORDER — LACTATED RINGERS IV SOLN
INTRAVENOUS | Status: DC
  Administered 2020-08-09: 50 mL via INTRAVENOUS

## 2020-08-09 MED ORDER — KETOROLAC TROMETHAMINE 30 MG/ML IJ SOLN
INTRAMUSCULAR | Status: AC
  Filled 2020-08-09: qty 1

## 2020-08-09 MED ORDER — HYDROMORPHONE HCL 1 MG/ML IJ SOLN
0.2500 mg | INTRAMUSCULAR | Status: DC | PRN
  Administered 2020-08-09: 0.25 mg via INTRAVENOUS
  Administered 2020-08-09: 0.5 mg via INTRAVENOUS

## 2020-08-09 MED ORDER — LIDOCAINE 2% (20 MG/ML) 5 ML SYRINGE
INTRAMUSCULAR | Status: DC | PRN
  Administered 2020-08-09: 60 mg via INTRAVENOUS

## 2020-08-09 MED ORDER — FENTANYL CITRATE (PF) 100 MCG/2ML IJ SOLN
INTRAMUSCULAR | Status: DC | PRN
Start: 2020-08-09 — End: 2020-08-09
  Administered 2020-08-09 (×2): 50 ug via INTRAVENOUS

## 2020-08-09 MED ORDER — ONDANSETRON HCL 4 MG/2ML IJ SOLN
INTRAMUSCULAR | Status: AC
  Filled 2020-08-09: qty 2

## 2020-08-09 MED ORDER — ACETAMINOPHEN 500 MG PO TABS
1000.0000 mg | ORAL_TABLET | Freq: Once | ORAL | Status: AC
  Administered 2020-08-09: 1000 mg via ORAL

## 2020-08-09 MED ORDER — OXYCODONE HCL 5 MG/5ML PO SOLN: 5.0000 mg | Freq: Once | ORAL | Status: DC | PRN

## 2020-08-09 MED ORDER — PROPOFOL 500 MG/50ML IV EMUL
INTRAVENOUS | Status: AC
  Filled 2020-08-09: qty 50

## 2020-08-09 MED ORDER — SODIUM CHLORIDE 0.9 % IR SOLN
Status: AC | PRN
  Administered 2020-08-09: 300 mL/h

## 2020-08-09 MED ORDER — ACETAMINOPHEN 500 MG PO TABS
ORAL_TABLET | ORAL | Status: AC
  Filled 2020-08-09: qty 2

## 2020-08-09 MED ORDER — PHENYLEPHRINE 40 MCG/ML (10ML) SYRINGE FOR IV PUSH (FOR BLOOD PRESSURE SUPPORT)
PREFILLED_SYRINGE | INTRAVENOUS | Status: AC
  Filled 2020-08-09: qty 10

## 2020-08-09 MED ORDER — KETOROLAC TROMETHAMINE 30 MG/ML IJ SOLN
INTRAMUSCULAR | Status: DC | PRN
  Administered 2020-08-09: 30 mg via INTRAVENOUS

## 2020-08-09 MED ORDER — SCOPOLAMINE 1 MG/3DAYS TD PT72
MEDICATED_PATCH | TRANSDERMAL | Status: AC
  Filled 2020-08-09: qty 1

## 2020-08-09 MED ORDER — MIDAZOLAM HCL 2 MG/2ML IJ SOLN
INTRAMUSCULAR | Status: AC
  Filled 2020-08-09: qty 2

## 2020-08-09 MED ORDER — MIDAZOLAM HCL 5 MG/5ML IJ SOLN
INTRAMUSCULAR | Status: DC | PRN
  Administered 2020-08-09: 2 mg via INTRAVENOUS

## 2020-08-09 MED ORDER — PROPOFOL 10 MG/ML IV BOLUS
INTRAVENOUS | Status: DC | PRN
  Administered 2020-08-09: 200 mg via INTRAVENOUS

## 2020-08-09 MED ORDER — POVIDONE-IODINE 10 % EX SWAB: 2.0000 "application " | Freq: Once | CUTANEOUS | Status: DC

## 2020-08-09 SURGICAL SUPPLY — 20 items
BNDG CONFORM 2 STRL LF (GAUZE/BANDAGES/DRESSINGS) IMPLANT
COVER WAND RF STERILE (DRAPES) ×4 IMPLANT
DILATOR CANAL MILEX (MISCELLANEOUS) IMPLANT
DRSG PAD ABDOMINAL 8X10 ST (GAUZE/BANDAGES/DRESSINGS) ×4 IMPLANT
GAUZE 4X4 16PLY RFD (DISPOSABLE) ×4 IMPLANT
GLOVE BIO SURGEON STRL SZ7.5 (GLOVE) ×8 IMPLANT
GOWN STRL REUS W/TWL LRG LVL3 (GOWN DISPOSABLE) ×8 IMPLANT
HOLDER FOLEY CATH W/STRAP (MISCELLANEOUS) ×4 IMPLANT
HOVERMATT SINGLE USE (MISCELLANEOUS) ×4 IMPLANT
IV NS 1000ML (IV SOLUTION) ×4
IV NS 1000ML BAXH (IV SOLUTION) ×2 IMPLANT
IV SET EXTENSION GRAVITY 40 LF (IV SETS) ×4 IMPLANT
KIT TURNOVER CYSTO (KITS) ×4 IMPLANT
NS IRRIG 500ML POUR BTL (IV SOLUTION) ×4 IMPLANT
PACK VAGINAL MINOR WOMEN LF (CUSTOM PROCEDURE TRAY) ×4 IMPLANT
PACKING VAGINAL (PACKING) IMPLANT
PAD OB MATERNITY 4.3X12.25 (PERSONAL CARE ITEMS) IMPLANT
TOWEL OR 17X26 10 PK STRL BLUE (TOWEL DISPOSABLE) ×4 IMPLANT
TRAY FOLEY W/BAG SLVR 14FR LF (SET/KITS/TRAYS/PACK) ×4 IMPLANT
WATER STERILE IRR 500ML POUR (IV SOLUTION) IMPLANT

## 2020-08-09 NOTE — Transfer of Care (Signed)
Immediate Anesthesia Transfer of Care Note  Patient: Kelly Hanson  Procedure(s) Performed: TANDEM RING INSERTION (N/A Vagina ) OPERATIVE ULTRASOUND (N/A Abdomen)  Patient Location: PACU  Anesthesia Type:General  Level of Consciousness: awake, alert  and oriented  Airway & Oxygen Therapy: Patient Spontanous Breathing and Patient connected to face mask oxygen  Post-op Assessment: Report given to RN and Post -op Vital signs reviewed and stable  Post vital signs: Reviewed and stable  Last Vitals:  Vitals Value Taken Time  BP 137/98 08/09/20 0823  Temp    Pulse 68 08/09/20 0824  Resp 12 08/09/20 0824  SpO2 100 % 08/09/20 0824  Vitals shown include unvalidated device data.  Last Pain:  Vitals:   08/09/20 0556  TempSrc: Oral  PainSc: 0-No pain      Patients Stated Pain Goal: 3 (77/37/36 6815)  Complications: No complications documented.

## 2020-08-09 NOTE — Discharge Instructions (Signed)

## 2020-08-09 NOTE — Anesthesia Procedure Notes (Signed)
Procedure Name: LMA Insertion Date/Time: 08/09/2020 7:42 AM Performed by: Genelle Bal, CRNA Pre-anesthesia Checklist: Patient identified, Emergency Drugs available, Suction available and Patient being monitored Patient Re-evaluated:Patient Re-evaluated prior to induction Oxygen Delivery Method: Circle system utilized Preoxygenation: Pre-oxygenation with 100% oxygen Induction Type: IV induction Ventilation: Mask ventilation without difficulty LMA: LMA inserted LMA Size: 4.0 Number of attempts: 1 Airway Equipment and Method: Bite block Placement Confirmation: positive ETCO2 Tube secured with: Tape Dental Injury: Teeth and Oropharynx as per pre-operative assessment

## 2020-08-09 NOTE — Progress Notes (Signed)
920 am Patient transferred from [place] PACU to CT Sim via stretcher with Quintin Alto, NT. Patient alert  and oriented x3. IV infusing at 125 cc/hr of LR to her Left hand. IV intact. Foley intact draining clear yellow urine to the drainage bag. Skin warm and dry. Respirations regular. 0945 Patient brought to room 1 in the Radiation Clinic for monitoring and observation. Patient denies any pain and declines offers for food or PO liquids. Alternating pressure stockings attached to the pump. IV tubing attached to the Alaris IV pump and continues with rate of 125 cc/hr. Patient given call bell and side rails up bilaterally. 0945 O2 sat 100 % P 63 R 18 BP 160/95 at right arm Temp 97.8. Pain rating 0. 1003 Patient resting with eyes open. 1050 Patient declines need for pain medicine. Pain 0.  1140 am RT staff transported patient to [place] via stretcher.  1220 Patient back in the radiation clinic. 1223  Foley d/c'd tip intact. BP 160/95 P 69 R 17 O2 sat 100% 1230 Tandem ring removed by Dr. Sondra Come. Patient denies pain, 1235  IV removed from left hand with tip intact. 1255 p Patient discharged by w/c to home with her husband.  Tandem ring d/c instructions printed on note and given to patient.  IV  Fluids in after PACU 400 cc LR and 800 cc urine emptied from foley catheter. Patient has declined all PO fluids.

## 2020-08-09 NOTE — Op Note (Signed)
08/09/2020  8:38 AM  PATIENT:  Kelly Hanson  59 y.o. female  PRE-OPERATIVE DIAGNOSIS:  CERVICAL CANCER  POST-OPERATIVE DIAGNOSIS:  CERVICAL CANCER  PROCEDURE:  Procedure(s): TANDEM RING INSERTION (N/A) OPERATIVE ULTRASOUND (N/A)  SURGEON:  Surgeon(s) and Role:    * Gery Pray, MD - Primary  PHYSICIAN ASSISTANT:   ASSISTANTS: none   ANESTHESIA:   general  EBL:  1 mL   BLOOD ADMINISTERED:none  DRAINS: Urinary Catheter (Foley)   LOCAL MEDICATIONS USED:  NONE  SPECIMEN:  No Specimen  DISPOSITION OF SPECIMEN:  N/A  COUNTS:  YES  TOURNIQUET:  * No tourniquets in log *  DICTATION: Patient was taken to [facility] long outpatient surgical center room 4. A general anesthetic was applied.patient was placed in the dorsolithotomy position.timeout was performed for the procedure preoperative medications and estimated time for surgery. Patient was prepped and draped in the usual sterile fashion. A Foley catheter was placed and then backfilled with approximately 300 cc of sterile water for ultrasound imaging purposes. On exam under anesthesia the patient was noted to have an excellent response to her external beam radiation therapy and radiosensitizing chemotherapy. There was no parametrial extension on intraoperative examination and no obvious extension into the vaginal region. The cervix was estimated to be approximately 3 x 3 cm.  the patient proceeded to undergo serial dilation of the cervical os. The uterus was anteverted and sounded to approximately 6 cm. Overall the uterus was small in size. Patient then underwent placement of a 45 degree tandem with a 40 mm cervical sleeve in place. The length of the tandem portion was 40 mm.   She then had a 45 degree cervical ring placed along the cervixwith a large shielding capIn place. This was then followed by a rectal paddle to complete the full assembly of the high-dose-rate equipment. Intraoperative ultrasound there was good  placement of the tandemwithin the endocervical and endometrial cavity. Patient tolerated the procedure well. She was subsequently taken to the recovery room in sterile condition. Later in the day the patient will be taken down to radiation oncology for planning and her third high-dose-rate treatment. She is scheduled to receive a total of 5 high-dose-rate treatments.  PLAN OF CARE: Transferred to radiation oncology for planning and treatment  PATIENT DISPOSITION:  PACU - hemodynamically stable.   Delay start of Pharmacological VTE agent (>24hrs) due to surgical blood loss or risk of bleeding: not applicable

## 2020-08-09 NOTE — Addendum Note (Signed)
Encounter addended by: De Burrs, RN on: 08/09/2020 1:59 PM  Actions taken: Clinical Note Signed, Flowsheet accepted, LDA properties accepted

## 2020-08-09 NOTE — Anesthesia Preprocedure Evaluation (Addendum)
Anesthesia Evaluation  Patient identified by MRN, date of birth, ID band Patient awake    Reviewed: Allergy & Precautions, NPO status , Patient's Chart, lab work & pertinent test results  History of Anesthesia Complications (+) AWARENESS UNDER ANESTHESIA  Airway Mallampati: I  TM Distance: >3 FB Neck ROM: Full    Dental  (+) Teeth Intact, Dental Advisory Given, Missing,    Pulmonary former smoker,    Pulmonary exam normal breath sounds clear to auscultation   (-) rales    Cardiovascular negative cardio ROS Normal cardiovascular exam Rhythm:Regular Rate:Normal     Neuro/Psych PSYCHIATRIC DISORDERS Anxiety negative neurological ROS     GI/Hepatic negative GI ROS, Neg liver ROS,   Endo/Other  Obesity BMI 33  Renal/GU negative Renal ROS  negative genitourinary   Musculoskeletal negative musculoskeletal ROS (+)   Abdominal   Peds  Hematology negative hematology ROS (+)   Anesthesia Other Findings   Reproductive/Obstetrics Cervical ca                           Anesthesia Physical Anesthesia Plan  ASA: II  Anesthesia Plan: General   Post-op Pain Management:    Induction: Intravenous  PONV Risk Score and Plan: 3 and Ondansetron, Dexamethasone, Midazolam, Treatment may vary due to age or medical condition and Scopolamine patch - Pre-op  Airway Management Planned: LMA  Additional Equipment: None  Intra-op Plan:   Post-operative Plan: Extubation in OR  Informed Consent: I have reviewed the patients History and Physical, chart, labs and discussed the procedure including the risks, benefits and alternatives for the proposed anesthesia with the patient or authorized representative who has indicated his/her understanding and acceptance.     Dental advisory given  Plan Discussed with: CRNA  Anesthesia Plan Comments: (Has had scopolamine patch in past, but no history of PONV. prefers  to continue with one)       Anesthesia Quick Evaluation

## 2020-08-09 NOTE — Interval H&P Note (Signed)
History and Physical Interval Note:  08/09/2020 7:24 AM  Kelly Hanson  has presented today for surgery, with the diagnosis of CERVICAL CANCER.  The various methods of treatment have been discussed with the patient and family. After consideration of risks, benefits and other options for treatment, the patient has consented to  Procedure(s): TANDEM RING INSERTION (N/A) OPERATIVE ULTRASOUND (N/A) as a surgical intervention.  The patient's history has been reviewed, patient examined, no change in status, stable for surgery.  I have reviewed the patient's chart and labs.  Questions were answered to the patient's satisfaction.     Gery Pray

## 2020-08-09 NOTE — Anesthesia Postprocedure Evaluation (Signed)
Anesthesia Post Note  Patient: Kelly Hanson  Procedure(s) Performed: TANDEM RING INSERTION (N/A Vagina ) OPERATIVE ULTRASOUND (N/A Abdomen)     Patient location during evaluation: PACU Anesthesia Type: General Level of consciousness: awake and alert, oriented and patient cooperative Pain management: pain level controlled Vital Signs Assessment: post-procedure vital signs reviewed and stable Respiratory status: spontaneous breathing, nonlabored ventilation and respiratory function stable Cardiovascular status: blood pressure returned to baseline and stable Postop Assessment: no apparent nausea or vomiting Anesthetic complications: no   No complications documented.  Last Vitals:  Vitals:   08/09/20 0900 08/09/20 0915  BP: (!) 154/98 (!) 163/88  Pulse: 65   Resp: 18 16  Temp:  36.7 C  SpO2: 100%     Last Pain:  Vitals:   08/09/20 0915  TempSrc:   PainSc: [name]

## 2020-08-09 NOTE — Progress Notes (Addendum)
   IMMEDIATELY FOLLOWING SURGERY: Do not drive or operate machinery for the first twenty four hours after surgery. Do not make any important decisions for twenty four hours after surgery or while taking narcotic pain medications or sedatives. If you develop intractable nausea and vomiting or a severe headache please notify your doctor immediately.   FOLLOW-UP: You do not need to follow up with anesthesia unless specifically instructed to do so.   WOUND CARE INSTRUCTIONS (if applicable): Expect some mild vaginal bleeding, but if large amount of bleeding occurs please contact Dr. Sondra Come at [phone] or the Radiation On-Call physician. Call for any fever greater than 101.0 degrees or increasing vaginal//abdominal pain or trouble urinating.   QUESTIONS?: Please feel free to call your physician or the hospital operator if you have any questions, and they will be happy to assist you. Resume all medications: as listed on your after visit summary. Your next appointment is:  Future Appointments  Date Time Provider [place]  08/17/2020 10:00 AM WL-US 1 WL-US [place]  08/17/2020  1:00 PM Gery Pray, MD [facility] None  08/17/2020  3:00 PM Gery Pray, MD [facility] None  08/23/2020 11:00 AM WL-US 1 WL-US [place]  08/23/2020  1:00 PM Gery Pray, MD [place] None  08/23/2020  3:00 PM Gery Pray, MD [facility] None  09/03/2020  9:15 AM [facility] None  09/03/2020  9:30 AM [facility] [place] FLUSH [facility] None  09/03/2020 10:00 AM Heath Lark, MD [facility] None

## 2020-08-10 ENCOUNTER — Encounter (HOSPITAL_BASED_OUTPATIENT_CLINIC_OR_DEPARTMENT_OTHER): Payer: Self-pay | Admitting: Radiation Oncology

## 2020-08-12 ENCOUNTER — Other Ambulatory Visit: Payer: Self-pay

## 2020-08-12 ENCOUNTER — Encounter (HOSPITAL_BASED_OUTPATIENT_CLINIC_OR_DEPARTMENT_OTHER): Payer: Self-pay | Admitting: Radiation Oncology

## 2020-08-12 NOTE — Progress Notes (Signed)
Spoke w/ via phone for pre-op interview---pt Lab needs dos----none               Lab results------none recent COVID test ------08-13-2020 at 1400 pm Arrive at -------530 am 08-17-2020 NPO after MN NO Solid Food.  Clear liquids from MN until---430 am then npo Medications to take morning of surgery -----none Diabetic medication -----n/a Patient Special Instructions -----none Pre-Op special Istructions -----none Patient verbalized understanding of instructions that were given at this phone interview. Patient denies shortness of breath, chest pain, fever, cough at this phone interview.

## 2020-08-13 ENCOUNTER — Other Ambulatory Visit (HOSPITAL_COMMUNITY)
Admission: RE | Admit: 2020-08-13 | Discharge: 2020-08-13 | Disposition: A | Discharge status: Home / Self Care | Payer: BC Managed Care – PPO | Source: Ambulatory Visit | Attending: Radiation Oncology | Admitting: Radiation Oncology

## 2020-08-13 DIAGNOSIS — Z20822 Contact with and (suspected) exposure to covid-19: Secondary | ICD-10-CM | POA: Diagnosis not present

## 2020-08-13 DIAGNOSIS — Z01812 Encounter for preprocedural laboratory examination: Secondary | ICD-10-CM | POA: Insufficient documentation

## 2020-08-13 LAB — SARS CORONAVIRUS 2 (TAT 6-24 HRS): SARS Coronavirus 2: NEGATIVE

## 2020-08-16 NOTE — Progress Notes (Signed)
  Radiation Oncology         [phone] ________________________________  Name: Kelly Hanson MRN: [id number]  Date: 08/17/2020  DOB: 08-28-1961  CC: [name], MD  [name], MD  HDR BRACHYTHERAPY NOTE  DIAGNOSIS: Clinical stage IIB squamous cell carcinoma of the cervix  NARRATIVE: The patient was brought to the [place] suite. Identity was confirmed. All relevant records and images related to the planned course of therapy were reviewed. The patient freely provided informed written consent to proceed with treatment after reviewing the details related to the planned course of therapy. The consent form was witnessed and verified by the simulation staff. Then, the patient was set-up in a stable reproducible supine position for radiation therapy. The tandem ring system was accessed and fiducial markers were placed within the tandem and ring.   Simple treatment device note: While in  the operating room the patient had construction of her custom tandem ring system. She will be treated with a 45 tandem/ring system. The patient had placement of a 40 mm tandem. A cervical ring with a large shielding was used for her treatment. A rectal paddle was also part of her custom set up device.  Verification simulation note: An AP and lateral film was obtained through the pelvis area. This was compared to the patient's planning films documenting accurate position of the tandem/ring system for treatment.  High-dose-rate brachytherapy treatment note:  The remote afterloading device was accessed through catheter system and attached to the tandem ring system. Patient then proceeded to undergo her fourth high-dose-rate treatment directed at the cervix. The patient was prescribed a dose of 5.5 gray to be delivered to the high risk clinical target volume. Patient was treated with 2 channels using 22 dwell positions. Treatment time was 463.9 seconds. The patient tolerated the procedure well. After completion  of her therapy, a radiation survey was performed documenting return of the iridium source into the GammaMed safe. The patient was then transferred to the nursing suite. She then had removal of the rectal paddle followed by the tandem and ring system. The patient tolerated the removal well.  PLAN: The patient will return next week for her fifth and final high-dose-rate treatment. ________________________________    Blair Promise, PhD, MD  This document serves as a record of services personally performed by Gery Pray, MD. It was created on his behalf by Clerance Lav, a trained medical scribe. The creation of this record is based on the scribe's personal observations and the provider's statements to them. This document has been checked and approved by the attending provider.

## 2020-08-17 ENCOUNTER — Ambulatory Visit (HOSPITAL_BASED_OUTPATIENT_CLINIC_OR_DEPARTMENT_OTHER)
Admission: RE | Admit: 2020-08-17 | Discharge: 2020-08-17 | Disposition: A | Discharge status: Other Acute Inpatient Hospital | Payer: BC Managed Care – PPO | Attending: Radiation Oncology | Admitting: Radiation Oncology

## 2020-08-17 ENCOUNTER — Ambulatory Visit
Admission: RE | Admit: 2020-08-17 | Discharge: 2020-08-17 | Disposition: A | Discharge status: Home / Self Care | Payer: BC Managed Care – PPO | Source: Ambulatory Visit | Attending: Radiation Oncology | Admitting: Radiation Oncology

## 2020-08-17 ENCOUNTER — Ambulatory Visit (HOSPITAL_COMMUNITY)
Admission: RE | Admit: 2020-08-17 | Discharge: 2020-08-17 | Disposition: A | Discharge status: Home / Self Care | Payer: BC Managed Care – PPO | Source: Ambulatory Visit | Attending: Radiation Oncology | Admitting: Radiation Oncology

## 2020-08-17 ENCOUNTER — Ambulatory Visit (HOSPITAL_BASED_OUTPATIENT_CLINIC_OR_DEPARTMENT_OTHER): Payer: BC Managed Care – PPO | Admitting: Anesthesiology

## 2020-08-17 ENCOUNTER — Encounter (HOSPITAL_BASED_OUTPATIENT_CLINIC_OR_DEPARTMENT_OTHER)
Admission: RE | Disposition: A | Discharge status: Other Acute Inpatient Hospital | Payer: Self-pay | Source: Home / Self Care | Attending: Radiation Oncology

## 2020-08-17 ENCOUNTER — Encounter (HOSPITAL_BASED_OUTPATIENT_CLINIC_OR_DEPARTMENT_OTHER): Payer: Self-pay | Admitting: Radiation Oncology

## 2020-08-17 ENCOUNTER — Other Ambulatory Visit: Payer: Self-pay

## 2020-08-17 DIAGNOSIS — F419 Anxiety disorder, unspecified: Secondary | ICD-10-CM | POA: Insufficient documentation

## 2020-08-17 DIAGNOSIS — Z823 Family history of stroke: Secondary | ICD-10-CM | POA: Diagnosis not present

## 2020-08-17 DIAGNOSIS — Z6834 Body mass index (BMI) 34.0-34.9, adult: Secondary | ICD-10-CM | POA: Insufficient documentation

## 2020-08-17 DIAGNOSIS — Z87891 Personal history of nicotine dependence: Secondary | ICD-10-CM | POA: Diagnosis not present

## 2020-08-17 DIAGNOSIS — C539 Malignant neoplasm of cervix uteri, unspecified: Secondary | ICD-10-CM

## 2020-08-17 DIAGNOSIS — E669 Obesity, unspecified: Secondary | ICD-10-CM | POA: Insufficient documentation

## 2020-08-17 DIAGNOSIS — Z510 Encounter for antineoplastic radiation therapy: Secondary | ICD-10-CM | POA: Insufficient documentation

## 2020-08-17 DIAGNOSIS — C530 Malignant neoplasm of endocervix: Secondary | ICD-10-CM | POA: Insufficient documentation

## 2020-08-17 DIAGNOSIS — R002 Palpitations: Secondary | ICD-10-CM | POA: Diagnosis not present

## 2020-08-17 HISTORY — PX: TANDEM RING INSERTION: SHX6199

## 2020-08-17 HISTORY — PX: OPERATIVE ULTRASOUND: SHX5996

## 2020-08-17 SURGERY — INSERTION, UTERINE TANDEM AND RING OR CYLINDER, FOR BRACHYTHERAPY
Anesthesia: General | Site: Vagina

## 2020-08-17 MED ORDER — KETOROLAC TROMETHAMINE 30 MG/ML IJ SOLN
INTRAMUSCULAR | Status: AC
  Filled 2020-08-17: qty 1

## 2020-08-17 MED ORDER — FENTANYL CITRATE (PF) 100 MCG/2ML IJ SOLN
INTRAMUSCULAR | Status: AC
  Filled 2020-08-17: qty 2

## 2020-08-17 MED ORDER — ONDANSETRON HCL 4 MG/2ML IJ SOLN
INTRAMUSCULAR | Status: DC | PRN
  Administered 2020-08-17: 4 mg via INTRAVENOUS

## 2020-08-17 MED ORDER — POVIDONE-IODINE 10 % EX SWAB: 2.0000 "application " | Freq: Once | CUTANEOUS | Status: DC

## 2020-08-17 MED ORDER — LIDOCAINE HCL (CARDIAC) PF 100 MG/5ML IV SOSY
PREFILLED_SYRINGE | INTRAVENOUS | Status: DC | PRN
  Administered 2020-08-17: 60 mg via INTRAVENOUS

## 2020-08-17 MED ORDER — FENTANYL CITRATE (PF) 100 MCG/2ML IJ SOLN
25.0000 ug | INTRAMUSCULAR | Status: DC | PRN
  Administered 2020-08-17 (×2): 50 ug via INTRAVENOUS

## 2020-08-17 MED ORDER — ACETAMINOPHEN 500 MG PO TABS
1000.0000 mg | ORAL_TABLET | Freq: Once | ORAL | Status: AC
  Administered 2020-08-17: 1000 mg via ORAL

## 2020-08-17 MED ORDER — FENTANYL CITRATE (PF) 100 MCG/2ML IJ SOLN
INTRAMUSCULAR | Status: DC | PRN
Start: 2020-08-17 — End: 2020-08-17
  Administered 2020-08-17 (×2): 50 ug via INTRAVENOUS

## 2020-08-17 MED ORDER — LACTATED RINGERS IV SOLN: INTRAVENOUS | Status: DC

## 2020-08-17 MED ORDER — DEXAMETHASONE SODIUM PHOSPHATE 10 MG/ML IJ SOLN
INTRAMUSCULAR | Status: DC | PRN
  Administered 2020-08-17: 10 mg via INTRAVENOUS

## 2020-08-17 MED ORDER — ACETAMINOPHEN 500 MG PO TABS
ORAL_TABLET | ORAL | Status: AC
  Filled 2020-08-17: qty 2

## 2020-08-17 MED ORDER — MIDAZOLAM HCL 2 MG/2ML IJ SOLN
INTRAMUSCULAR | Status: AC
  Filled 2020-08-17: qty 2

## 2020-08-17 MED ORDER — KETOROLAC TROMETHAMINE 30 MG/ML IJ SOLN
INTRAMUSCULAR | Status: DC | PRN
  Administered 2020-08-17: 30 mg via INTRAVENOUS

## 2020-08-17 MED ORDER — ONDANSETRON HCL 4 MG/2ML IJ SOLN
INTRAMUSCULAR | Status: AC
  Filled 2020-08-17: qty 2

## 2020-08-17 MED ORDER — SODIUM CHLORIDE 0.9 % IR SOLN
Status: DC | PRN
  Administered 2020-08-17: 300 mL via INTRAVESICAL

## 2020-08-17 MED ORDER — DEXAMETHASONE SODIUM PHOSPHATE 10 MG/ML IJ SOLN
INTRAMUSCULAR | Status: AC
  Filled 2020-08-17: qty 1

## 2020-08-17 MED ORDER — MIDAZOLAM HCL 2 MG/2ML IJ SOLN
INTRAMUSCULAR | Status: DC | PRN
  Administered 2020-08-17: 2 mg via INTRAVENOUS

## 2020-08-17 MED ORDER — SCOPOLAMINE 1 MG/3DAYS TD PT72
1.0000 | MEDICATED_PATCH | TRANSDERMAL | Status: DC
  Administered 2020-08-17: 1.5 mg via TRANSDERMAL

## 2020-08-17 MED ORDER — PROPOFOL 10 MG/ML IV BOLUS
INTRAVENOUS | Status: AC
  Filled 2020-08-17: qty 40

## 2020-08-17 MED ORDER — LIDOCAINE 2% (20 MG/ML) 5 ML SYRINGE
INTRAMUSCULAR | Status: AC
  Filled 2020-08-17: qty 5

## 2020-08-17 MED ORDER — SCOPOLAMINE 1 MG/3DAYS TD PT72
MEDICATED_PATCH | TRANSDERMAL | Status: AC
  Filled 2020-08-17: qty 1

## 2020-08-17 MED ORDER — PROPOFOL 10 MG/ML IV BOLUS
INTRAVENOUS | Status: DC | PRN
  Administered 2020-08-17: 200 mg via INTRAVENOUS

## 2020-08-17 SURGICAL SUPPLY — 19 items
BNDG CONFORM 2 STRL LF (GAUZE/BANDAGES/DRESSINGS) IMPLANT
COVER WAND RF STERILE (DRAPES) ×4 IMPLANT
DILATOR CANAL MILEX (MISCELLANEOUS) IMPLANT
DRSG PAD ABDOMINAL 8X10 ST (GAUZE/BANDAGES/DRESSINGS) ×4 IMPLANT
GAUZE 4X4 16PLY RFD (DISPOSABLE) ×4 IMPLANT
GLOVE BIO SURGEON STRL SZ7.5 (GLOVE) ×8 IMPLANT
GOWN STRL REUS W/TWL LRG LVL3 (GOWN DISPOSABLE) ×4 IMPLANT
HOLDER FOLEY CATH W/STRAP (MISCELLANEOUS) ×4 IMPLANT
IV NS 1000ML (IV SOLUTION) ×4
IV NS 1000ML BAXH (IV SOLUTION) ×2 IMPLANT
IV SET EXTENSION GRAVITY 40 LF (IV SETS) ×4 IMPLANT
KIT TURNOVER CYSTO (KITS) ×4 IMPLANT
MAT PREVALON FULL STRYKER (MISCELLANEOUS) ×4 IMPLANT
PACK VAGINAL MINOR WOMEN LF (CUSTOM PROCEDURE TRAY) ×4 IMPLANT
PACKING VAGINAL (PACKING) IMPLANT
PAD OB MATERNITY 4.3X12.25 (PERSONAL CARE ITEMS) IMPLANT
TOWEL OR 17X26 10 PK STRL BLUE (TOWEL DISPOSABLE) ×4 IMPLANT
TRAY FOLEY W/BAG SLVR 14FR LF (SET/KITS/TRAYS/PACK) ×4 IMPLANT
WATER STERILE IRR 500ML POUR (IV SOLUTION) ×4 IMPLANT

## 2020-08-17 NOTE — Progress Notes (Addendum)
940 am Patient transferred from [place] PACU to room # 1 in the Radiation Clinic via stretcher with Quintin Alto, [place]. Patient alert and oriented x3. IV infusing at 125 cc/hr of LR to her Left hand. IV intact. Foley intact draining clear yellow urine to the drainage bag. Skin warm and dry. Respirations regular. Vitals  T-97.8 P-70 R-18 BP 159/104 O2 sat 100% 945 am 1000cc LR hung at 125 cc/hr on Alaris pump. Patient reports pelvic pain level is a "4." Patien tis alert and oriented and denies need for pain medicine. 10 am Patient taken to CT by Radiation Staff by stretcher and was returned to room # 1 at 1015 am. Patient resting on her back and declines any needs at this time. Automatic pressure stockings attached to the pump. 1055 am Eyes open and declines pain med.  1130 Patient resting on stretcher.  1210p Patient reports feeling "cramps" but does not want to take any medicine for it. She has declined PO fluids and food as she wants to pick up take out after she leaves the hospital. 1305 Patient transferred to the [place] room via stretcher. 1415 Patient returned to Rm 3 1 in the radiation clinic. She denies pain . 1425 Foley catheter d/c'd with tip intact. BP 151/90 P 74 Sat 99% 1430 Dr. Sondra Come in room and removed the Tandem Ring from the patient. Patient tolerated this well. 1435 Automatic pressure stockings on legs removed. IV removed from pt's left hand with tip intact. Patient assisted to a sitting position and was able to get herself dressed. 1455 Patient discharged from the clinic by w/c to the first floor where her husband picked her up by car and took her home. Patient was provided discharge instructions and information reviewed.  Patient had 625 cc IV fluid in and 600 cc urine output from foley catheter. Patient had no PO fluids while in the hospital today.

## 2020-08-17 NOTE — Op Note (Signed)
08/17/2020  8:42 AM  PATIENT:  Kelly Hanson  59 y.o. female  PRE-OPERATIVE DIAGNOSIS:  CERVICAL CANCER  POST-OPERATIVE DIAGNOSIS:  CERVICAL CANCER  PROCEDURE:  Procedure(s): TANDEM RING INSERTION (N/A) OPERATIVE ULTRASOUND (N/A)  SURGEON:  Surgeon(s) and Role:    * Gery Pray, MD - Primary  PHYSICIAN ASSISTANT:   ASSISTANTS: none   ANESTHESIA:   general  EBL:  0 mL BLOOD ADMINISTERED:none  DRAINS: Urinary Catheter (Foley)   LOCAL MEDICATIONS USED:  NONE  SPECIMEN:  No Specimen  DISPOSITION OF SPECIMEN:  N/A  COUNTS:  YES  TOURNIQUET:  * No tourniquets in log *  DICTATION: Patient was taken to [facility] long outpatient surgical center room 4. A general anesthetic was applied.patient was placed in the dorsolithotomy position.timeout was performed for the procedure preoperative medications and estimated time for surgery. Patient was prepped and draped in the usual sterile fashion. A Foley catheter was placed and then the bladder was backfilled with approximately 300 cc of sterile water for ultrasound imaging purposes. On exam under anesthesia the patient was noted to have an excellent response to her external beam radiation therapy and radiosensitizing chemotherapy. There was no parametrial extension on intraoperative examination and no obvious extension into the vaginal region. The cervix was estimated to be approximately 3 x 3 cm.the patient proceeded to undergo serial dilation of the cervical os. The uterus was anteverted and sounded to approximately 6 cm. Overall the uterus was small in size. Patient then underwent placement of a 45 degree tandem with a 40 mmcervical sleeve in place. The length of the tandem portion was40 mm.Shethen had a 45 degree cervical ring placed along the cervixwith alargeshielding capIn place. This was then followed by a rectal paddle to complete the full assembly of the high-dose-rate equipment. Intraoperative ultrasound there  was good placement of the tandemwithin the endocervical and endometrial cavity. Patient tolerated the procedure well. She was subsequently taken to the recovery room in stable condition. Later in the day the patient will be taken down to radiation oncology for planning and herfourthhigh-dose-rate treatment. She is scheduled to receive a total of 5 high-dose-rate treatments.  PLAN OF CARE: Transferred to radiation oncology for planning and treatment  PATIENT DISPOSITION:  PACU - hemodynamically stable.   Delay start of Pharmacological VTE agent (>24hrs) due to surgical blood loss or risk of bleeding: not applicable

## 2020-08-17 NOTE — Patient Instructions (Signed)
IMMEDIATELY FOLLOWING SURGERY: Do not drive or operate machinery for the first twenty four hours after surgery. Do not make any important decisions for twenty four hours after surgery or while taking narcotic pain medications or sedatives. If you develop intractable nausea and vomiting or a severe headache please notify your doctor immediately.   FOLLOW-UP: You do not need to follow up with anesthesia unless specifically instructed to do so.   WOUND CARE INSTRUCTIONS (if applicable): Expect some mild vaginal bleeding, but if large amount of bleeding occurs please contact Dr. Sondra Come at [phone] or the Radiation On-Call physician. Call for any fever greater than 101.0 degrees or increasing vaginal//abdominal pain or trouble urinating.   QUESTIONS?: Please feel free to call your physician or the hospital operator if you have any questions, and they will be happy to assist you. Resume all medications: as listed on your after visit summary. Your next appointment is:  Future Appointments  Date Time Provider [place]  08/23/2020 11:00 AM WL-US 1 WL-US [place]  08/23/2020  1:00 PM Gery Pray, MD [facility] None  08/23/2020  3:00 PM Gery Pray, MD [facility] None  09/03/2020  9:15 AM [facility] LAB [facility] None  09/03/2020  9:30 AM [facility] [place] FLUSH [facility] None  09/03/2020 10:00 AM Heath Lark, MD [facility] None

## 2020-08-17 NOTE — Anesthesia Postprocedure Evaluation (Signed)
Anesthesia Post Note  Patient: Kelly Hanson  Procedure(s) Performed: TANDEM RING INSERTION (N/A Vagina ) OPERATIVE ULTRASOUND (N/A Abdomen)     Patient location during evaluation: PACU Anesthesia Type: General Level of consciousness: awake and alert Pain management: pain level controlled Vital Signs Assessment: post-procedure vital signs reviewed and stable Respiratory status: spontaneous breathing, nonlabored ventilation, respiratory function stable and patient connected to nasal cannula oxygen Cardiovascular status: blood pressure returned to baseline and stable Postop Assessment: no apparent nausea or vomiting Anesthetic complications: no   No complications documented.  Last Vitals:  Vitals:   08/17/20 0830 08/17/20 0845  BP: (!) 175/96 (!) 165/83  Pulse: 78 74  Resp: 18 10  Temp:    SpO2: 99% 97%    Last Pain:  Vitals:   08/17/20 0855  TempSrc:   PainSc: 4                  Shaleka Brines L Takeru Bose

## 2020-08-17 NOTE — Interval H&P Note (Signed)
History and Physical Interval Note:  08/17/2020 7:26 AM  Kelly Hanson  has presented today for surgery, with the diagnosis of CERVICAL CANCER.  The various methods of treatment have been discussed with the patient and family. After consideration of risks, benefits and other options for treatment, the patient has consented to  Procedure(s): TANDEM RING INSERTION (N/A) OPERATIVE ULTRASOUND (N/A) as a surgical intervention.  The patient's history has been reviewed, patient examined, no change in status, stable for surgery.  I have reviewed the patient's chart and labs.  Questions were answered to the patient's satisfaction.     Gery Pray

## 2020-08-17 NOTE — Anesthesia Preprocedure Evaluation (Addendum)
Anesthesia Evaluation  Patient identified by MRN, date of birth, ID band Patient awake    Reviewed: Allergy & Precautions, NPO status , Patient's Chart, lab work & pertinent test results  Airway Mallampati: II  TM Distance: >3 FB Neck ROM: Full    Dental  (+) Partial Upper, Missing,    Pulmonary neg pulmonary ROS, former smoker,    Pulmonary exam normal breath sounds clear to auscultation       Cardiovascular negative cardio ROS Normal cardiovascular exam Rhythm:Regular Rate:Normal     Neuro/Psych PSYCHIATRIC DISORDERS Anxiety negative neurological ROS     GI/Hepatic negative GI ROS, Neg liver ROS,   Endo/Other  negative endocrine ROSObese BMI 35  Renal/GU negative Renal ROS  negative genitourinary   Musculoskeletal negative musculoskeletal ROS (+)   Abdominal   Peds  Hematology negative hematology ROS (+)   Anesthesia Other Findings Cervical CA  Reproductive/Obstetrics                            Anesthesia Physical Anesthesia Plan  ASA: II  Anesthesia Plan: General   Post-op Pain Management:    Induction: Intravenous  PONV Risk Score and Plan: 3 and Ondansetron, Dexamethasone and Midazolam  Airway Management Planned: LMA  Additional Equipment:   Intra-op Plan:   Post-operative Plan: Extubation in OR  Informed Consent: I have reviewed the patients History and Physical, chart, labs and discussed the procedure including the risks, benefits and alternatives for the proposed anesthesia with the patient or authorized representative who has indicated his/her understanding and acceptance.     Dental advisory given  Plan Discussed with: CRNA  Anesthesia Plan Comments:         Anesthesia Quick Evaluation

## 2020-08-17 NOTE — Anesthesia Procedure Notes (Signed)
Procedure Name: LMA Insertion Date/Time: 08/17/2020 7:35 AM Performed by: Raenette Rover, CRNA Pre-anesthesia Checklist: Patient identified, Emergency Drugs available, Suction available and Patient being monitored Patient Re-evaluated:Patient Re-evaluated prior to induction Oxygen Delivery Method: Circle system utilized Preoxygenation: Pre-oxygenation with 100% oxygen Induction Type: IV induction LMA: LMA inserted LMA Size: 4.0 Number of attempts: 1 Placement Confirmation: positive ETCO2 and breath sounds checked- equal and bilateral Tube secured with: Tape Dental Injury: Teeth and Oropharynx as per pre-operative assessment

## 2020-08-17 NOTE — Transfer of Care (Signed)
Immediate Anesthesia Transfer of Care Note  Patient: Kelly Hanson  Procedure(s) Performed: TANDEM RING INSERTION (N/A ) OPERATIVE ULTRASOUND (N/A )  Patient Location: PACU  Anesthesia Type:General  Level of Consciousness: awake, alert , oriented and patient cooperative  Airway & Oxygen Therapy: Patient Spontanous Breathing  Post-op Assessment: Report given to RN and Post -op Vital signs reviewed and stable  Post vital signs: Reviewed and stable  Last Vitals:  Vitals Value Taken Time  BP 167/85 0824  Temp    Pulse 82 08/17/20 0823  Resp 12 08/17/20 0823  SpO2 98 % 08/17/20 0823  Vitals shown include unvalidated device data.  Last Pain:  Vitals:   08/17/20 0547  TempSrc: Oral         Complications: No complications documented.

## 2020-08-17 NOTE — Discharge Instructions (Signed)

## 2020-08-18 ENCOUNTER — Other Ambulatory Visit: Payer: Self-pay

## 2020-08-18 ENCOUNTER — Encounter (HOSPITAL_BASED_OUTPATIENT_CLINIC_OR_DEPARTMENT_OTHER): Payer: Self-pay | Admitting: Radiation Oncology

## 2020-08-18 NOTE — Progress Notes (Signed)
Spoke w/ via phone for pre-op interview---pt Lab needs dos----   none            Lab results------none COVID test -----08-19-2020 at 830 am- Arrive at -------845 am 08-23-2020 NPO after MN NO Solid Food.  Clear liquids from MN until---745 am then npo Medications to take morning of surgery -----none Diabetic medication -----n/a Patient Special Instructions -----none Pre-Op special Istructions -----none Patient verbalized understanding of instructions that were given at this phone interview. Patient denies shortness of breath, chest pain, fever, cough at this phone interview.

## 2020-08-19 ENCOUNTER — Other Ambulatory Visit (HOSPITAL_COMMUNITY)
Admission: RE | Admit: 2020-08-19 | Discharge: 2020-08-19 | Disposition: A | Discharge status: Home / Self Care | Payer: BC Managed Care – PPO | Source: Ambulatory Visit | Attending: Radiation Oncology | Admitting: Radiation Oncology

## 2020-08-19 DIAGNOSIS — Z20822 Contact with and (suspected) exposure to covid-19: Secondary | ICD-10-CM | POA: Diagnosis not present

## 2020-08-19 DIAGNOSIS — Z01818 Encounter for other preprocedural examination: Secondary | ICD-10-CM | POA: Diagnosis not present

## 2020-08-19 LAB — SARS CORONAVIRUS 2 (TAT 6-24 HRS): SARS Coronavirus 2: NEGATIVE

## 2020-08-23 ENCOUNTER — Ambulatory Visit (HOSPITAL_COMMUNITY)
Admission: RE | Admit: 2020-08-23 | Discharge: 2020-08-23 | Disposition: A | Discharge status: Home / Self Care | Payer: BC Managed Care – PPO | Source: Ambulatory Visit | Attending: Radiation Oncology | Admitting: Radiation Oncology

## 2020-08-23 ENCOUNTER — Ambulatory Visit
Admission: RE | Admit: 2020-08-23 | Discharge: 2020-08-23 | Disposition: A | Discharge status: Home / Self Care | Payer: BC Managed Care – PPO | Source: Ambulatory Visit | Attending: Radiation Oncology | Admitting: Radiation Oncology

## 2020-08-23 ENCOUNTER — Other Ambulatory Visit: Payer: Self-pay

## 2020-08-23 ENCOUNTER — Ambulatory Visit (HOSPITAL_BASED_OUTPATIENT_CLINIC_OR_DEPARTMENT_OTHER): Payer: BC Managed Care – PPO | Admitting: Anesthesiology

## 2020-08-23 ENCOUNTER — Ambulatory Visit (HOSPITAL_BASED_OUTPATIENT_CLINIC_OR_DEPARTMENT_OTHER)
Admission: RE | Admit: 2020-08-23 | Discharge: 2020-08-23 | Disposition: A | Discharge status: Home / Self Care | Payer: BC Managed Care – PPO | Attending: Radiation Oncology | Admitting: Radiation Oncology

## 2020-08-23 ENCOUNTER — Encounter (HOSPITAL_BASED_OUTPATIENT_CLINIC_OR_DEPARTMENT_OTHER): Payer: Self-pay | Admitting: Radiation Oncology

## 2020-08-23 ENCOUNTER — Encounter (HOSPITAL_BASED_OUTPATIENT_CLINIC_OR_DEPARTMENT_OTHER)
Admission: RE | Disposition: A | Discharge status: Home / Self Care | Payer: Self-pay | Source: Home / Self Care | Attending: Radiation Oncology

## 2020-08-23 VITALS — BP 155/90 | HR 66 | Temp 98.4°F | Resp 18

## 2020-08-23 DIAGNOSIS — Z923 Personal history of irradiation: Secondary | ICD-10-CM

## 2020-08-23 DIAGNOSIS — Z6834 Body mass index (BMI) 34.0-34.9, adult: Secondary | ICD-10-CM | POA: Diagnosis not present

## 2020-08-23 DIAGNOSIS — Z87891 Personal history of nicotine dependence: Secondary | ICD-10-CM | POA: Diagnosis not present

## 2020-08-23 DIAGNOSIS — C539 Malignant neoplasm of cervix uteri, unspecified: Secondary | ICD-10-CM

## 2020-08-23 DIAGNOSIS — E669 Obesity, unspecified: Secondary | ICD-10-CM | POA: Insufficient documentation

## 2020-08-23 DIAGNOSIS — M48061 Spinal stenosis, lumbar region without neurogenic claudication: Secondary | ICD-10-CM | POA: Insufficient documentation

## 2020-08-23 DIAGNOSIS — M4317 Spondylolisthesis, lumbosacral region: Secondary | ICD-10-CM | POA: Diagnosis not present

## 2020-08-23 DIAGNOSIS — N950 Postmenopausal bleeding: Secondary | ICD-10-CM | POA: Insufficient documentation

## 2020-08-23 DIAGNOSIS — F419 Anxiety disorder, unspecified: Secondary | ICD-10-CM | POA: Diagnosis not present

## 2020-08-23 DIAGNOSIS — K2970 Gastritis, unspecified, without bleeding: Secondary | ICD-10-CM | POA: Diagnosis not present

## 2020-08-23 HISTORY — PX: OPERATIVE ULTRASOUND: SHX5996

## 2020-08-23 HISTORY — DX: Personal history of irradiation: Z92.3

## 2020-08-23 HISTORY — PX: TANDEM RING INSERTION: SHX6199

## 2020-08-23 SURGERY — INSERTION, UTERINE TANDEM AND RING OR CYLINDER, FOR BRACHYTHERAPY
Anesthesia: General

## 2020-08-23 MED ORDER — ESTRADIOL 0.1 MG/GM VA CREA
TOPICAL_CREAM | VAGINAL | Status: AC
  Filled 2020-08-23: qty 42.5

## 2020-08-23 MED ORDER — DEXAMETHASONE SODIUM PHOSPHATE 10 MG/ML IJ SOLN
INTRAMUSCULAR | Status: DC | PRN
  Administered 2020-08-23: 10 mg via INTRAVENOUS

## 2020-08-23 MED ORDER — ONDANSETRON HCL 4 MG/2ML IJ SOLN
INTRAMUSCULAR | Status: AC
  Filled 2020-08-23: qty 2

## 2020-08-23 MED ORDER — SCOPOLAMINE 1 MG/3DAYS TD PT72
MEDICATED_PATCH | TRANSDERMAL | Status: AC
  Filled 2020-08-23: qty 1

## 2020-08-23 MED ORDER — MIDAZOLAM HCL 2 MG/2ML IJ SOLN
INTRAMUSCULAR | Status: AC
  Filled 2020-08-23: qty 2

## 2020-08-23 MED ORDER — POVIDONE-IODINE 10 % EX SWAB: 2.0000 "application " | Freq: Once | CUTANEOUS | Status: DC

## 2020-08-23 MED ORDER — ACETAMINOPHEN 500 MG PO TABS
1000.0000 mg | ORAL_TABLET | Freq: Once | ORAL | Status: AC
  Administered 2020-08-23: 1000 mg via ORAL

## 2020-08-23 MED ORDER — ONDANSETRON HCL 4 MG/2ML IJ SOLN
INTRAMUSCULAR | Status: DC | PRN
  Administered 2020-08-23: 4 mg via INTRAVENOUS

## 2020-08-23 MED ORDER — MIDAZOLAM HCL 2 MG/2ML IJ SOLN
INTRAMUSCULAR | Status: DC | PRN
  Administered 2020-08-23: 2 mg via INTRAVENOUS

## 2020-08-23 MED ORDER — OXYCODONE HCL 5 MG PO TABS: 5.0000 mg | ORAL_TABLET | Freq: Once | ORAL | Status: DC | PRN

## 2020-08-23 MED ORDER — SCOPOLAMINE 1 MG/3DAYS TD PT72
1.0000 | MEDICATED_PATCH | TRANSDERMAL | Status: DC
  Administered 2020-08-23: 1.5 mg via TRANSDERMAL

## 2020-08-23 MED ORDER — KETOROLAC TROMETHAMINE 30 MG/ML IJ SOLN
INTRAMUSCULAR | Status: DC | PRN
  Administered 2020-08-23: 30 mg via INTRAVENOUS

## 2020-08-23 MED ORDER — 0.9 % SODIUM CHLORIDE (POUR BTL) OPTIME
TOPICAL | Status: DC | PRN
  Administered 2020-08-23: 500 mL

## 2020-08-23 MED ORDER — KETOROLAC TROMETHAMINE 30 MG/ML IJ SOLN: 30.0000 mg | Freq: Once | INTRAMUSCULAR | Status: DC | PRN

## 2020-08-23 MED ORDER — LACTATED RINGERS IV SOLN
INTRAVENOUS | Status: DC
  Administered 2020-08-23: 50 mL via INTRAVENOUS

## 2020-08-23 MED ORDER — PROPOFOL 10 MG/ML IV BOLUS
INTRAVENOUS | Status: AC
  Filled 2020-08-23: qty 20

## 2020-08-23 MED ORDER — DEXAMETHASONE SODIUM PHOSPHATE 10 MG/ML IJ SOLN
INTRAMUSCULAR | Status: AC
  Filled 2020-08-23: qty 1

## 2020-08-23 MED ORDER — OXYCODONE HCL 5 MG/5ML PO SOLN: 5.0000 mg | Freq: Once | ORAL | Status: DC | PRN

## 2020-08-23 MED ORDER — FENTANYL CITRATE (PF) 100 MCG/2ML IJ SOLN
INTRAMUSCULAR | Status: DC | PRN
Start: 2020-08-23 — End: 2020-08-23
  Administered 2020-08-23 (×2): 50 ug via INTRAVENOUS

## 2020-08-23 MED ORDER — LIDOCAINE 2% (20 MG/ML) 5 ML SYRINGE
INTRAMUSCULAR | Status: AC
  Filled 2020-08-23: qty 5

## 2020-08-23 MED ORDER — LIDOCAINE 2% (20 MG/ML) 5 ML SYRINGE
INTRAMUSCULAR | Status: DC | PRN
  Administered 2020-08-23: 40 mg via INTRAVENOUS

## 2020-08-23 MED ORDER — MEPERIDINE HCL 25 MG/ML IJ SOLN: 6.2500 mg | INTRAMUSCULAR | Status: DC | PRN

## 2020-08-23 MED ORDER — ONDANSETRON HCL 4 MG/2ML IJ SOLN: 4.0000 mg | Freq: Once | INTRAMUSCULAR | Status: DC | PRN

## 2020-08-23 MED ORDER — ACETAMINOPHEN 500 MG PO TABS
ORAL_TABLET | ORAL | Status: AC
  Filled 2020-08-23: qty 2

## 2020-08-23 MED ORDER — KETOROLAC TROMETHAMINE 30 MG/ML IJ SOLN
INTRAMUSCULAR | Status: AC
  Filled 2020-08-23: qty 1

## 2020-08-23 MED ORDER — SODIUM CHLORIDE 0.9 % IR SOLN
Status: DC | PRN
  Administered 2020-08-23: 1000 mL

## 2020-08-23 MED ORDER — PROPOFOL 10 MG/ML IV BOLUS
INTRAVENOUS | Status: DC | PRN
  Administered 2020-08-23: 170 mg via INTRAVENOUS

## 2020-08-23 MED ORDER — FENTANYL CITRATE (PF) 100 MCG/2ML IJ SOLN
INTRAMUSCULAR | Status: AC
  Filled 2020-08-23: qty 2

## 2020-08-23 SURGICAL SUPPLY — 19 items
BNDG CONFORM 2 STRL LF (GAUZE/BANDAGES/DRESSINGS) IMPLANT
COVER WAND RF STERILE (DRAPES) ×3 IMPLANT
DILATOR CANAL MILEX (MISCELLANEOUS) IMPLANT
DRSG PAD ABDOMINAL 8X10 ST (GAUZE/BANDAGES/DRESSINGS) ×3 IMPLANT
GAUZE 4X4 16PLY RFD (DISPOSABLE) ×3 IMPLANT
GLOVE BIO SURGEON STRL SZ7.5 (GLOVE) ×6 IMPLANT
GOWN STRL REUS W/TWL LRG LVL3 (GOWN DISPOSABLE) ×3 IMPLANT
HOLDER FOLEY CATH W/STRAP (MISCELLANEOUS) ×3 IMPLANT
IV NS 1000ML (IV SOLUTION) ×3
IV NS 1000ML BAXH (IV SOLUTION) ×1 IMPLANT
IV SET EXTENSION GRAVITY 40 LF (IV SETS) ×3 IMPLANT
KIT TURNOVER CYSTO (KITS) ×3 IMPLANT
MAT PREVALON FULL STRYKER (MISCELLANEOUS) ×3 IMPLANT
PACK VAGINAL MINOR WOMEN LF (CUSTOM PROCEDURE TRAY) ×3 IMPLANT
PACKING VAGINAL (PACKING) IMPLANT
PAD OB MATERNITY 4.3X12.25 (PERSONAL CARE ITEMS) IMPLANT
TOWEL OR 17X26 10 PK STRL BLUE (TOWEL DISPOSABLE) ×3 IMPLANT
TRAY FOLEY W/BAG SLVR 14FR LF (SET/KITS/TRAYS/PACK) ×3 IMPLANT
WATER STERILE IRR 500ML POUR (IV SOLUTION) ×3 IMPLANT

## 2020-08-23 NOTE — Anesthesia Preprocedure Evaluation (Addendum)
Anesthesia Evaluation  Patient identified by MRN, date of birth, ID band Patient awake    Reviewed: Patient's Chart, lab work & pertinent test results  Airway Mallampati: II  TM Distance: >3 FB Neck ROM: Full    Dental  (+) Teeth Intact   Pulmonary Patient abstained from smoking., former smoker,    Pulmonary exam normal        Cardiovascular negative cardio ROS   Rhythm:Regular Rate:Normal     Neuro/Psych Anxiety negative neurological ROS     GI/Hepatic Neg liver ROS, Gastritis, chemotherapy related nausea   Endo/Other  negative endocrine ROS  Renal/GU negative Renal ROS   Cervical cancer    Musculoskeletal negative musculoskeletal ROS (+)   Abdominal Normal abdominal exam  (+)   Peds negative pediatric ROS (+)  Hematology Chemotherapy related pancytopenia   Anesthesia Other Findings   Reproductive/Obstetrics negative OB ROS                            Anesthesia Physical Anesthesia Plan  ASA: III  Anesthesia Plan: General   Post-op Pain Management:    Induction: Intravenous  PONV Risk Score and Plan: 3 and Ondansetron, Dexamethasone and Scopolamine patch - Pre-op  Airway Management Planned: LMA and Mask  Additional Equipment: None  Intra-op Plan:   Post-operative Plan: Extubation in OR  Informed Consent: I have reviewed the patients History and Physical, chart, labs and discussed the procedure including the risks, benefits and alternatives for the proposed anesthesia with the patient or authorized representative who has indicated his/her understanding and acceptance.       Plan Discussed with: CRNA and Surgeon  Anesthesia Plan Comments: (Lab Results      Component                Value               Date                      WBC                      2.9 (L)             07/19/2020                HGB                      12.1                07/19/2020                 HCT                      35.7 (L)            07/19/2020                MCV                      96.5                07/19/2020                PLT                      124 (L)  07/19/2020           Covid-19 Nucleic Acid Test Results Lab Results      Component                Value               Date                      SARSCOV2NAA              NEGATIVE            08/19/2020                SARSCOV2NAA              NEGATIVE            08/13/2020                [place]              NEGATIVE            07/22/2020          )        Anesthesia Quick Evaluation

## 2020-08-23 NOTE — Anesthesia Postprocedure Evaluation (Signed)
Anesthesia Post Note  Patient: Kelly Hanson  Procedure(s) Performed: TANDEM RING INSERTION (N/A ) OPERATIVE ULTRASOUND (N/A )     Patient location during evaluation: PACU Anesthesia Type: General Level of consciousness: awake and alert Pain management: pain level controlled Vital Signs Assessment: post-procedure vital signs reviewed and stable Respiratory status: spontaneous breathing, nonlabored ventilation, respiratory function stable and patient connected to nasal cannula oxygen Cardiovascular status: blood pressure returned to baseline and stable Postop Assessment: no apparent nausea or vomiting Anesthetic complications: no   No complications documented.  Last Vitals:  Vitals:   08/23/20 1230 08/23/20 1245  BP: 139/80 (!) 141/84  Pulse: 64 67  Resp: 13 12  Temp:    SpO2: 100% 99%    Last Pain:  Vitals:   08/23/20 1230  TempSrc:   PainSc: 0-No pain                 [name]

## 2020-08-23 NOTE — Discharge Instructions (Signed)
  Post Anesthesia Home Care Instructions  Activity: Get plenty of rest for the remainder of the day. A responsible individual must stay with you for 24 hours following the procedure.  For the next 24 hours, DO NOT: -Drive a car -Paediatric nurse -Drink alcoholic beverages -Take any medication unless instructed by your physician -Make any legal decisions or sign important papers.  Meals: Start with liquid foods such as gelatin or soup. Progress to regular foods as tolerated. Avoid greasy, spicy, heavy foods. If nausea and/or vomiting occur, drink only clear liquids until the nausea and/or vomiting subsides. Call your physician if vomiting continues.  Special Instructions/Symptoms: Your throat may feel dry or sore from the anesthesia or the breathing tube placed in your throat during surgery. If this causes discomfort, gargle with warm salt water. The discomfort should disappear within 24 hours.  If you had a scopolamine patch placed behind your ear for the management of post- operative nausea and/or vomiting:  1. The medication in the patch is effective for 72 hours, after which it should be removed.  Wrap patch in a tissue and discard in the trash. Wash hands thoroughly with soap and water. 2. You may remove the patch earlier than 72 hours if you experience unpleasant side effects which may include dry mouth, dizziness or visual disturbances. 3. Avoid touching the patch. Wash your hands with soap and water after contact with the patch.    Remove patch behind right ear by Thursday, August 26, 2020.

## 2020-08-23 NOTE — Transfer of Care (Signed)
  Last Vitals:  Vitals Value Taken Time  BP 144/80 08/23/20 1139  Temp 36.7 C 08/23/20 1139  Pulse 70 08/23/20 1143  Resp 10 08/23/20 1143  SpO2 100 % 08/23/20 1143  Vitals shown include unvalidated device data.  Last Pain:  Vitals:   08/23/20 1139  TempSrc:   PainSc: 0-No pain      Patients Stated Pain Goal: 5 (08/23/20 6767)  Immediate Anesthesia Transfer of Care Note  Patient: Kelly Hanson  Procedure(s) Performed: Procedure(s) (LRB): TANDEM RING INSERTION (N/A) OPERATIVE ULTRASOUND (N/A)  Patient Location: PACU  Anesthesia Type: General  Level of Consciousness: awake, alert  and oriented  Airway & Oxygen Therapy: Patient Spontanous Breathing and Patient connected to nasal cannula oxygen  Post-op Assessment: Report given to PACU RN and Post -op Vital signs reviewed and stable  Post vital signs: Reviewed and stable  Complications: No apparent anesthesia complications

## 2020-08-23 NOTE — Progress Notes (Signed)
1310  Patient transferred from [place] PACU to CT Sim  via stretcher with Quintin Alto, NT. Patient alert and oriented x3. IV infusing at 125 cc/hr of LR to her right hand. IV intact. Foley intact draining clear yellow urine to the drainage bag. Skin warm and dry. Respirations regular.  1330 Patient transferred to room  1 of the radiation clinic via stretcher. 1000cc LR hung at 125 cc/hr on Alaris pump. Vital signs stable. Alternating pressure stockings attached to the pump. Patient is alert and oriented and denies any pain. Tandem Ring equipment is intact in patient.  1415 Patient resting lying flat on the stretcher and declines food or fluids. 1500  Eyes open and declines pain med.  1545 Patient transferred to [place] room via stretcher.  1645 Patient transferred back to room 1.   1650 Foley catheter d/c'd with tip intact.     1  Dr. Sondra Come in room and removed the Tandem Ring from the patient. Patient tolerated this well.      1700 Automatic pressure stockings on legs removed. IV removed from pt's righthand with tip intact. Patient assisted to a sitting position and was able to get herself dressed.        1730 Patient discharged from the clinic by w/c to the first floor where her husband picked her up by car and took her home. Patient was provided discharge instructions and information reviewed. Patient advised to call with any questions and not to drive any vehicles for 24 hours due to her anesthesia. Patient given a note signed by Dr. Sondra Come to RTW on 08/30/20 at 30 hours for 2 weeks and then resume full time hours. Patient advised she will be contacted in about a week with her 1 month f/u appt. Patient verbalized understanding.  U/0 1000cc IV fluid in 400 cc of LR and 0 PO fluids  In.

## 2020-08-23 NOTE — Anesthesia Procedure Notes (Signed)
Procedure Name: LMA Insertion Date/Time: 08/23/2020 11:12 AM Performed by: Mechele Claude, CRNA Pre-anesthesia Checklist: Patient identified, Emergency Drugs available, Suction available and Patient being monitored Patient Re-evaluated:Patient Re-evaluated prior to induction Oxygen Delivery Method: Circle system utilized Preoxygenation: Pre-oxygenation with 100% oxygen Induction Type: IV induction Ventilation: Mask ventilation without difficulty LMA: LMA inserted LMA Size: 4.0 Number of attempts: 1 Airway Equipment and Method: Bite block Placement Confirmation: positive ETCO2 Tube secured with: Tape Dental Injury: Teeth and Oropharynx as per pre-operative assessment

## 2020-08-23 NOTE — Interval H&P Note (Signed)
History and Physical Interval Note:  08/23/2020 10:46 AM  Kelly Hanson  has presented today for surgery, with the diagnosis of CERVICAL CANCER.  The various methods of treatment have been discussed with the patient and family. After consideration of risks, benefits and other options for treatment, the patient has consented to  Procedure(s): TANDEM RING INSERTION (N/A) OPERATIVE ULTRASOUND (N/A) as a surgical intervention.  The patient's history has been reviewed, patient examined, no change in status, stable for surgery.  I have reviewed the patient's chart and labs.  Questions were answered to the patient's satisfaction.     Gery Pray

## 2020-08-23 NOTE — Progress Notes (Signed)
  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 Identity was confirmed. All relevant records and images related to the planned course of therapy were reviewed. The patient freely provided informed written consent to proceed with treatment after reviewing the details related to the planned course of therapy. The consent form was witnessed and verified by the simulation staff. Then, the patient was set-up in a stable reproducible supine position for radiation therapy. The tandem ring system was accessed and fiducial markers were placed within the tandem and ring.   Simple treatment device note: While in  the operating room the patient had construction of her custom tandem ring system. She will be treated with a 45 tandem/ring system. The patient had placement of a 40 mm tandem. A cervical ring with a large shielding was used for her treatment. A rectal paddle was also part of her custom set up device.  Verification simulation note: An AP and lateral film was obtained through the pelvis area. This was compared to the patient's planning films documenting accurate position of the tandem/ring system for treatment.  High-dose-rate brachytherapy treatment note:  The remote afterloading device was accessed through catheter system and attached to the tandem ring system. Patient then proceeded to undergo her fifth high-dose-rate treatment directed at the cervix. The patient was prescribed a dose of 5.5 gray to be delivered to the high risk clinical target volume. Patient was treated with 2 channels using 22 dwell positions. Treatment time was 460.6 seconds. The patient tolerated the procedure well. After completion  of her therapy, a radiation survey was performed documenting return of the iridium source into the [employer] safe. The patient was then transferred to the nursing suite. She then had removal of the rectal paddle followed by the tandem and ring system. The patient tolerated the removal well.  PLAN: The patient will follow-up with radiation oncology in one month.  ________________________________    Blair Promise, PhD, MD  This document serves as a record of services personally performed by Gery Pray, MD. It was created on his behalf by Clerance Lav, a trained medical scribe. The creation of this record is based on the scribe's personal observations and the provider's statements to them. This document has been checked and approved by the attending provider.

## 2020-08-23 NOTE — Addendum Note (Signed)
Encounter addended by: De Burrs, RN on: 08/23/2020 6:43 PM  Actions taken: Clinical Note Signed, LDA properties accepted

## 2020-08-24 ENCOUNTER — Encounter (HOSPITAL_BASED_OUTPATIENT_CLINIC_OR_DEPARTMENT_OTHER): Payer: Self-pay | Admitting: Radiation Oncology

## 2020-08-24 NOTE — Brief Op Note (Signed)
08/23/2020  7:52 AM  PATIENT:  Kelly Hanson  59 y.o. female  PRE-OPERATIVE DIAGNOSIS:  CERVICAL CANCER  POST-OPERATIVE DIAGNOSIS:  CERVICAL CANCER  PROCEDURE:  Procedure(s): TANDEM RING INSERTION (N/A) OPERATIVE ULTRASOUND (N/A)  SURGEON:  Surgeon(s) and Role:    * Gery Pray, MD - Primary  PHYSICIAN ASSISTANT:   ASSISTANTS: none   ANESTHESIA:   general  EBL:  5 mL   BLOOD ADMINISTERED:none  DRAINS: Urinary Catheter (Foley)   LOCAL MEDICATIONS USED:  NONE  SPECIMEN:  No Specimen  DISPOSITION OF SPECIMEN:  N/A  COUNTS:  YES  TOURNIQUET:  * No tourniquets in log *  DICTATION: Patient was taken to [facility] long outpatient surgical center room3. A general anesthetic was applied.patient was placed in the dorsolithotomy position.timeout was performed for the procedure preoperative medications and estimated time for surgery. Patient was prepped and draped in the usual sterile fashion. A Foley catheter was placed and then the bladder was backfilled with approximately 300 cc of sterile water for ultrasound imaging purposes. On exam under anesthesia the patient was noted to have an excellent response to her external beam radiation therapy and radiosensitizing chemotherapy. There was no parametrial extension on intraoperative examination and no obvious extension into the vaginal region. The cervix was estimated to be approximately 3 x 2.75 cm.The cervical sleeve placed last week remained in place so no dilation was necessary. the uterus was anteverted and  approximately 6 cm in length. Overall the uterus was small in size. Patient then underwent placement of a 45 degree tandem with a40 mm thru the cervical sleeve already in place. The length of the tandem portion was32mm.Shethen had a 45 degree cervical ring placed along the cervixwith alargeshielding capIn place. This was then followed by a rectal paddle to complete the full assembly of the high-dose-rate  equipment. Intraoperative ultrasound there was good placement of the tandemwithin the endocervical and endometrial cavity. Patient tolerated the procedure well. She was subsequently taken to the recovery room in stable condition. Later in the day the patient will be taken down to radiation oncology for planning and herfifthhigh-dose-rate treatment. She is scheduled to receive a total of 5 high-dose-rate treatments.  This will complete her planned course of definitive radiation therapy along with radiosensitizing chemotherapy.  PLAN OF CARE: Transferred to radiation oncology for planning and treatment  PATIENT DISPOSITION:  PACU - hemodynamically stable.   Delay start of Pharmacological VTE agent (>24hrs) due to surgical blood loss or risk of bleeding: not applicable

## 2020-08-30 ENCOUNTER — Telehealth: Payer: Self-pay | Admitting: *Deleted

## 2020-08-30 NOTE — Telephone Encounter (Signed)
Called patient to inform of fu with Dr. Sondra Come on 09-23-20 @ 11:30 am, spoke with patient and she is aware of this appt.

## 2020-09-03 ENCOUNTER — Inpatient Hospital Stay: Payer: BC Managed Care – PPO | Attending: Gynecologic Oncology | Admitting: Hematology and Oncology

## 2020-09-03 ENCOUNTER — Inpatient Hospital Stay: Payer: BC Managed Care – PPO

## 2020-09-03 ENCOUNTER — Encounter: Payer: Self-pay | Admitting: Hematology and Oncology

## 2020-09-03 ENCOUNTER — Other Ambulatory Visit: Payer: Self-pay

## 2020-09-03 ENCOUNTER — Telehealth: Payer: Self-pay

## 2020-09-03 VITALS — BP 143/89 | HR 81 | Temp 97.4°F | Resp 18 | Wt 226.4 lb

## 2020-09-03 DIAGNOSIS — R197 Diarrhea, unspecified: Secondary | ICD-10-CM | POA: Insufficient documentation

## 2020-09-03 DIAGNOSIS — Z923 Personal history of irradiation: Secondary | ICD-10-CM | POA: Insufficient documentation

## 2020-09-03 DIAGNOSIS — Z79899 Other long term (current) drug therapy: Secondary | ICD-10-CM | POA: Diagnosis not present

## 2020-09-03 DIAGNOSIS — Z7982 Long term (current) use of aspirin: Secondary | ICD-10-CM | POA: Diagnosis not present

## 2020-09-03 DIAGNOSIS — R110 Nausea: Secondary | ICD-10-CM | POA: Diagnosis not present

## 2020-09-03 DIAGNOSIS — Z299 Encounter for prophylactic measures, unspecified: Secondary | ICD-10-CM | POA: Diagnosis not present

## 2020-09-03 DIAGNOSIS — C539 Malignant neoplasm of cervix uteri, unspecified: Secondary | ICD-10-CM | POA: Insufficient documentation

## 2020-09-03 DIAGNOSIS — Z9221 Personal history of antineoplastic chemotherapy: Secondary | ICD-10-CM | POA: Insufficient documentation

## 2020-09-03 DIAGNOSIS — Z452 Encounter for adjustment and management of vascular access device: Secondary | ICD-10-CM | POA: Insufficient documentation

## 2020-09-03 DIAGNOSIS — T451X5A Adverse effect of antineoplastic and immunosuppressive drugs, initial encounter: Secondary | ICD-10-CM

## 2020-09-03 LAB — CBC WITH DIFFERENTIAL (CANCER CENTER ONLY)
Abs Immature Granulocytes: 0.01 10*3/uL (ref 0.00–0.07)
Basophils Absolute: 0 10*3/uL (ref 0.0–0.1)
Basophils Relative: 1 %
Eosinophils Absolute: 0.1 10*3/uL (ref 0.0–0.5)
Eosinophils Relative: 3 %
HCT: 36.2 % (ref 36.0–46.0)
Hemoglobin: 12.3 g/dL (ref 12.0–15.0)
Immature Granulocytes: 0 %
Lymphocytes Relative: 10 %
Lymphs Abs: 0.4 10*3/uL — ABNORMAL LOW (ref 0.7–4.0)
MCH: 33.8 pg (ref 26.0–34.0)
MCHC: 34 g/dL (ref 30.0–36.0)
MCV: 99.5 fL (ref 80.0–100.0)
Monocytes Absolute: 0.6 10*3/uL (ref 0.1–1.0)
Monocytes Relative: 14 %
Neutro Abs: 3.1 10*3/uL (ref 1.7–7.7)
Neutrophils Relative %: 72 %
Platelet Count: 203 10*3/uL (ref 150–400)
RBC: 3.64 MIL/uL — ABNORMAL LOW (ref 3.87–5.11)
RDW: 16 % — ABNORMAL HIGH (ref 11.5–15.5)
WBC Count: 4.3 10*3/uL (ref 4.0–10.5)
nRBC: 0 % (ref 0.0–0.2)

## 2020-09-03 MED ORDER — SODIUM CHLORIDE 0.9% FLUSH
10.0000 mL | Freq: Once | INTRAVENOUS | Status: AC
  Administered 2020-09-03: 10 mL
  Filled 2020-09-03: qty 10

## 2020-09-03 MED ORDER — HEPARIN SOD (PORK) LOCK FLUSH 100 UNIT/ML IV SOLN
250.0000 [IU] | Freq: Once | INTRAVENOUS | Status: AC
  Administered 2020-09-03: 500 [IU]
  Filled 2020-09-03: qty 5

## 2020-09-03 NOTE — Assessment & Plan Note (Signed)
She has completed her radiation treatment She has recovered from most side effects from chemotherapy She has some mild loose stool but no peripheral neuropathy or residual nausea from treatment side effects I recommend port flush again in the middle of November In mid December, she will come in for labs, port flush, PET CT scan and follow-up with GYN surgeon for further evaluation If she have complete response to therapy, we will get her port removed

## 2020-09-03 NOTE — Progress Notes (Signed)
[place] OFFICE PROGRESS NOTE  Patient Care Team: [name], MD as PCP - General (Family Medicine) Awanda Mink Craige Cotta, RN as Oncology Nurse Navigator (Oncology)  ASSESSMENT & PLAN:  Cervical cancer [facility]) She has completed her radiation treatment She has recovered from most side effects from chemotherapy She has some mild loose stool but no peripheral neuropathy or residual nausea from treatment side effects I recommend port flush again in the middle of November In mid December, she will come in for labs, port flush, PET CT scan and follow-up with GYN surgeon for further evaluation If she have complete response to therapy, we will get her port removed  Chemotherapy-induced nausea This is resolved She has antiemetics to take as needed  Diarrhea This could be some residual side effects from recent treatment She can take Imodium as needed  Preventive measure We discussed the role of Covid vaccination and influenza vaccination She is not interested to get either 1 today   Orders Placed This Encounter  Procedures   NM PET Image Restage (PS) Skull Base to Thigh    Standing Status:   Future    Standing Expiration Date:   09/03/2021    Order Specific Question:   If indicated for the ordered procedure, I authorize the administration of a radiopharmaceutical per Radiology protocol    Answer:   Yes    Order Specific Question:   Preferred imaging location?    Answer:   [facility]    Order Specific Question:   Radiology Contrast Protocol - do NOT remove file path    Answer:   [email or web address]    Order Specific Question:   Is the patient pregnant?    Answer:   No    All questions were answered. The patient knows to call the clinic with any problems, questions or concerns. The total time spent in the appointment was 20 minutes encounter with patients including review of chart and various tests results, discussions  about plan of care and coordination of care plan   Heath Lark, MD 09/03/2020 10:25 AM  INTERVAL HISTORY: Please see below for problem oriented charting. She returns for follow-up She has completed all her radiation treatment She had very mild loose stool No recent nausea No recent infection, fever or chills Denies peripheral neuropathy from therapy Denies vaginal bleeding or discharge  SUMMARY OF ONCOLOGIC HISTORY: Oncology History  Cervical cancer ([place])  02/09/2020 Initial Diagnosis   Between 6-8 months ago, the patient endorses having irregular spotting intermittnetly every few weeks that would last for a few days. This did not change over time and she describes it as light spotting. This was the first bleeding she had after menopause. She ultimately saw her PCP who performed a pap in March which returned showing HSIL. The patient tells me today that this is the first pap test she has had ever. She was then referred to gyn and underwent EMB/LEEP and transvaginal ultrasound for work-up of her PMB and high grade cervical dysplasia. Unfortunately, her pathology revealed SCC of the cervix   05/03/2020 Pathology Results   Endocervical curettings show fragments of squamous epithelium with high-grade squamous intraepithelial lesion, CIN-3 as well as fragments of invasive squamous cell carcinoma within the endometrial biopsy.  The invasive squamous cell carcinoma have depth of invasion more than 3 mm, with at least 10 mm, thickness almost 1.2 mm.   05/18/2020 Imaging   1. No findings of metastatic disease to the abdomen/pelvis. 2. There is a small  amount of gas either along the cervical canal or the adjacent fornix. A well-defined mass is not seen. 3. Chronic bilateral pars defects at L5 with 7 mm grade 1 anterolisthesis of L5 on S1 and resulting mild left foraminal stenosis. 4. Suspected right foraminal disc protrusion at L3-4 possibly causing mild right foraminal impingement.     05/21/2020  Cancer Staging   Staging form: Cervix Uteri, AJCC Version 9 - Clinical stage from 05/21/2020: FIGO Stage IIB (cT2b, cM0) - Signed by Heath Lark, MD on 05/21/2020   05/26/2020 PET scan   1. Intensely hypermetabolic circumferential metabolic activity in the uterine cervix consistent with cervical carcinoma. 2. No evidence of metastatic disease in the pelvis. No metastatic lymphadenopathy. 3. No evidence of distant metastatic disease. 4. Focus of metabolic activity in the gastric antrum would be unlikely location for a cervical carcinoma metastasis. Favor gastritis or potentially primary gastric neoplasm. Consider upper endoscopy for further evaluation. 5. Diffuse activity in the thyroid gland is favored thyroiditis.   06/03/2020 Procedure   Status post right IJ port catheter.   06/07/2020 -  Chemotherapy   The patient had weekly cisplatin for chemotherapy treatment.       REVIEW OF SYSTEMS:   Constitutional: Denies fevers, chills or abnormal weight loss Eyes: Denies blurriness of vision Ears, nose, mouth, throat, and face: Denies mucositis or sore throat Respiratory: Denies cough, dyspnea or wheezes Cardiovascular: Denies palpitation, chest discomfort or lower extremity swelling Skin: Denies abnormal skin rashes Lymphatics: Denies new lymphadenopathy or easy bruising Neurological:Denies numbness, tingling or new weaknesses Behavioral/Psych: Mood is stable, no new changes  All other systems were reviewed with the patient and are negative.  I have reviewed the past medical history, past surgical history, social history and family history with the patient and they are unchanged from previous note.  ALLERGIES:  has No Known Allergies.  MEDICATIONS:  Current Outpatient Medications  Medication Sig Dispense Refill   acetaminophen (TYLENOL) 325 MG tablet Take 650 mg by mouth every 6 (six) hours as needed for moderate pain.      aspirin EC 81 MG tablet Take 81 mg by mouth every 4 (four)  hours as needed. (Patient not taking: Reported on 07/06/2020)     diphenhydrAMINE (BENADRYL) 25 MG tablet Take 25 mg by mouth every 6 (six) hours as needed for itching or allergies.     diphenhydramine-acetaminophen (TYLENOL PM) 25-500 MG TABS tablet Take 1 tablet by mouth at bedtime as needed (sleep).      escitalopram (LEXAPRO) 5 MG tablet Take 5 mg by mouth at bedtime.     lidocaine-prilocaine (EMLA) cream Apply to affected area once 30 g 3   ondansetron (ZOFRAN) 8 MG tablet Take 1 tablet (8 mg total) by mouth every 8 (eight) hours as needed. 30 tablet 1   prochlorperazine (COMPAZINE) 10 MG tablet Take 1 tablet (10 mg total) by mouth every 6 (six) hours as needed (Nausea or vomiting). 30 tablet 1   No current facility-administered medications for this visit.    PHYSICAL EXAMINATION: ECOG PERFORMANCE STATUS: 0 - Asymptomatic  Vitals:   09/03/20 1005  BP: (!) 143/89  Pulse: 81  Resp: 18  Temp: (!) 97.4 F (36.3 C)  SpO2: 100%   Filed Weights   09/03/20 1005  Weight: 226 lb 6.4 oz (102.7 kg)    GENERAL:alert, no distress and comfortable SKIN: skin color, texture, turgor are normal, no rashes or significant lesions EYES: normal, Conjunctiva are pink and non-injected, sclera clear OROPHARYNX:no exudate,  no erythema and lips, buccal mucosa, and tongue normal  NECK: supple, thyroid normal size, non-tender, without nodularity LYMPH:  no palpable lymphadenopathy in the cervical, axillary or inguinal LUNGS: clear to auscultation and percussion with normal breathing effort HEART: regular rate & rhythm and no murmurs and no lower extremity edema ABDOMEN:abdomen soft, non-tender and normal bowel sounds Musculoskeletal:no cyanosis of digits and no clubbing  NEURO: alert & oriented x 3 with fluent speech, no focal motor/sensory deficits  LABORATORY DATA:  I have reviewed the data as listed    Component Value Date/Time   NA 136 07/05/2020 1202   K 4.7 07/05/2020 1202   CL 102  07/05/2020 1202   CO2 23 07/05/2020 1202   GLUCOSE 77 07/05/2020 1202   BUN 18 07/05/2020 1202   CREATININE 0.79 07/05/2020 1202   CALCIUM 9.5 07/05/2020 1202   PROT 7.3 06/03/2020 0822   ALBUMIN 4.7 06/03/2020 0822   AST 18 06/03/2020 0822   AST 20 05/14/2020 1211   ALT 21 06/03/2020 0822   ALT 31 05/14/2020 1211   ALKPHOS 63 06/03/2020 0822   BILITOT 0.8 06/03/2020 0822   BILITOT 0.7 05/14/2020 1211   GFRNONAA >60 07/05/2020 1202   GFRAA >60 07/05/2020 1202    No results found for: SPEP, UPEP  Lab Results  Component Value Date   WBC 4.3 09/03/2020   NEUTROABS 3.1 09/03/2020   HGB 12.3 09/03/2020   HCT 36.2 09/03/2020   MCV 99.5 09/03/2020   PLT 203 09/03/2020      Chemistry      Component Value Date/Time   NA 136 07/05/2020 1202   K 4.7 07/05/2020 1202   CL 102 07/05/2020 1202   CO2 23 07/05/2020 1202   BUN 18 07/05/2020 1202   CREATININE 0.79 07/05/2020 1202      Component Value Date/Time   CALCIUM 9.5 07/05/2020 1202   ALKPHOS 63 06/03/2020 0822   AST 18 06/03/2020 0822   AST 20 05/14/2020 1211   ALT 21 06/03/2020 0822   ALT 31 05/14/2020 1211   BILITOT 0.8 06/03/2020 0822   BILITOT 0.7 05/14/2020 1211

## 2020-09-03 NOTE — Telephone Encounter (Signed)
TC from [place] in lab B met hemolyzed. Dr. Alvy Bimler informed. Test canceled.

## 2020-09-03 NOTE — Assessment & Plan Note (Signed)
We discussed the role of Covid vaccination and influenza vaccination She is not interested to get either 1 today

## 2020-09-03 NOTE — Assessment & Plan Note (Signed)
This is resolved She has antiemetics to take as needed

## 2020-09-03 NOTE — Assessment & Plan Note (Signed)
This could be some residual side effects from recent treatment She can take Imodium as needed

## 2020-09-03 NOTE — Patient Instructions (Signed)

## 2020-09-23 ENCOUNTER — Encounter: Payer: Self-pay | Admitting: Radiation Oncology

## 2020-09-23 ENCOUNTER — Ambulatory Visit
Admission: RE | Admit: 2020-09-23 | Discharge: 2020-09-23 | Disposition: A | Discharge status: Home / Self Care | Payer: BC Managed Care – PPO | Source: Ambulatory Visit | Attending: Radiation Oncology | Admitting: Radiation Oncology

## 2020-09-23 ENCOUNTER — Other Ambulatory Visit: Payer: Self-pay

## 2020-09-23 DIAGNOSIS — Z79899 Other long term (current) drug therapy: Secondary | ICD-10-CM | POA: Insufficient documentation

## 2020-09-23 DIAGNOSIS — C530 Malignant neoplasm of endocervix: Secondary | ICD-10-CM

## 2020-09-23 DIAGNOSIS — C539 Malignant neoplasm of cervix uteri, unspecified: Secondary | ICD-10-CM | POA: Diagnosis not present

## 2020-09-23 DIAGNOSIS — Z7982 Long term (current) use of aspirin: Secondary | ICD-10-CM | POA: Insufficient documentation

## 2020-09-23 DIAGNOSIS — Z923 Personal history of irradiation: Secondary | ICD-10-CM | POA: Insufficient documentation

## 2020-09-23 DIAGNOSIS — R197 Diarrhea, unspecified: Secondary | ICD-10-CM | POA: Insufficient documentation

## 2020-09-23 NOTE — Progress Notes (Addendum)
Patient here for a 1 month f/u visit with Dr. Sondra Come.  Patient denies any pain,bleeding, bladder or bowel problems. Has occ diarrhea.  BP (!) 149/95 (BP Location: Left Arm, Patient Position: Sitting)   Pulse 77   Temp (!) 97.3 F (36.3 C) (Tympanic)   Resp 18   Ht 5\' 7"  (1.702 m)   Wt 230 lb (104.3 kg)   SpO2 97%   BMI 36.02 kg/m   Wt Readings from Last 3 Encounters:  09/23/20 230 lb (104.3 kg)  09/03/20 226 lb 6.4 oz (102.7 kg)  08/23/20 222 lb 11.2 oz (101 kg)    Home Care Instructions for the Insertion and Care of Your Vaginal Dilator  Why Do I Need a Vaginal Dilator?  Internal radiation therapy may cause scar tissue to form at the top of your vagina (vaginal cuff).  This may make vaginal examinations difficult in the future. You can prevent scar tissue from forming by using a vaginal dilator (a smooth plastic rod), and/or by having regular sexual intercourse.  If not using the dilator you should be having intercourse two or three times a week.  If you are unable to have intercourse, you should use your vaginal dilator.  You may have some spotting or bleeding from your dilator or intercourse the first few times. You may also have some discomfort. If discomfort occurs with intercourse, you and your partner may need to stop for a while and try again later.  How to Use Your Vaginal Dilator  - Wash the dilator with soap and water before and after each use. - Check the dilator to be sure it is smooth. Do not use the dilator if you find any roughspots. - Coat the dilator with K-Y Jelly, Astroglide, or Replens. Do not use Vaseline, baby oil, or other oil based lubricants. They are not water-soluble and can be irritating to the tissues in the vagina. - Lie on your back with your knees bent and legs apart. - Insert the rounded end of the dilator into your vagina as far as it will go without causing pain or discomfort. - Close your knees and slowly straighten your legs. - Keep  the dilator in your vagina for about 10 to 15 minutes.  Please use 3 times a week, for example: Monday, Wednesday and Friday evenings. [facility] your knees, open your legs, and gently remove the dilator. - Gently cleanse the skin around the vaginal opening. - Wash the dilator after each use. -  It is important that you use the dilator routinely until instructed otherwise by your doctor.  Patient given a small plus and a medium dilator with lubricant to use at home. Above instructions reviewed and given to patient. She verbalizes understanding of this information.

## 2020-09-23 NOTE — Progress Notes (Signed)
Radiation Oncology         [phone] ________________________________  Name: Kelly Hanson MRN: [id number]  Date: 09/23/2020  DOB: 03/02/1961  Follow-Up Visit Note  CC: Kelly Grammes, MD  Kelly Grammes, MD    ICD-10-CM   1. Malignant neoplasm of endocervix [facility])  C53.0     Diagnosis: Clinical stage IIB squamous cell carcinoma of the cervix  Interval Since Last Radiation: One month  Radiation Treatment Dates: 06/07/2020 through 07/19/2020 Site Technique Total Dose (Gy) Dose per Fx (Gy) Completed Fx Beam Energies  Cervix: Cervix IMRT 45/45 1.8 25/25 6X  Cervix: Cervix_Bst 3D 9/9 1.8 5/5 15X   HDR brachytherapy: 07/26/2020, 08/06/2020, 08/09/2020, 08/17/2020, 08/23/2020  Narrative:  The patient returns today for routine follow-up. Since the end of treatment, the patient was seen by Dr. Alvy Bimler on 09/03/2020. At that time, she was noted to have recovered from most of the side effects from chemotherapy with the exception of mild loose stools.                On review of systems, she reports occasional diarrhea. She denies pelvic pain, vaginal bleeding, and changes in bowel/bladder patterns.  ALLERGIES:  has No Known Allergies.  Meds: Current Outpatient Medications  Medication Sig Dispense Refill  . acetaminophen (TYLENOL) 325 MG tablet Take 650 mg by mouth every 6 (six) hours as needed for moderate pain.     Marland Kitchen aspirin EC 81 MG tablet Take 81 mg by mouth every 4 (four) hours as needed.    . diphenhydrAMINE (BENADRYL) 25 MG tablet Take 25 mg by mouth every 6 (six) hours as needed for itching or allergies.    . diphenhydramine-acetaminophen (TYLENOL PM) 25-500 MG TABS tablet Take 1 tablet by mouth at bedtime as needed (sleep).     Marland Kitchen escitalopram (LEXAPRO) 5 MG tablet Take 5 mg by mouth at bedtime.    . lidocaine-prilocaine (EMLA) cream Apply to affected area once 30 g 3  . ondansetron (ZOFRAN) 8 MG tablet Take 1 tablet (8 mg total) by mouth every 8 (eight) hours as needed. 30  tablet 1  . prochlorperazine (COMPAZINE) 10 MG tablet Take 1 tablet (10 mg total) by mouth every 6 (six) hours as needed (Nausea or vomiting). 30 tablet 1   No current facility-administered medications for this encounter.    Physical Findings: The patient is in no acute distress. Patient is alert and oriented.  height is 5\' 7"  (1.702 m) and weight is 230 lb (104.3 kg). Her tympanic temperature is 97.3 F (36.3 C) (abnormal). Her blood pressure is 149/95 (abnormal) and her pulse is 77. Her respiration is 18 and oxygen saturation is 97%.   Lungs are clear to auscultation bilaterally. Heart has regular rate and rhythm. No palpable cervical, supraclavicular, or axillary adenopathy. Abdomen soft, non-tender, normal bowel sounds. Pelvic exam deferred in light of recent radiation treatment.  Lab Findings: Lab Results  Component Value Date   WBC 4.3 09/03/2020   HGB 12.3 09/03/2020   HCT 36.2 09/03/2020   MCV 99.5 09/03/2020   PLT 203 09/03/2020    Radiographic Findings: No results found.  Impression: Clinical stage IIB squamous cell carcinoma of the cervix  The patient is recovering from the effects of radiation.  Overall she tolerated her radiation and chemotherapy quite well.  Minimal diarrhea at this time.  Plan: The patient is scheduled to follow up with Dr. Berline Lopes on 11/24/2020. PET scan will be performed around that same time. She will follow up with  radiation oncology in March 2022.  Today the patient was given a vaginal dilator and instructions on its use.   ____________________________________   Blair Promise, PhD, MD  This document serves as a record of services personally performed by Gery Pray, MD. It was created on his behalf by Clerance Lav, a trained medical scribe. The creation of this record is based on the scribe's personal observations and the provider's statements to them. This document has been checked and approved by the attending provider.

## 2020-10-15 ENCOUNTER — Other Ambulatory Visit: Payer: Self-pay

## 2020-10-15 ENCOUNTER — Inpatient Hospital Stay: Payer: BC Managed Care – PPO | Attending: Hematology and Oncology

## 2020-10-15 DIAGNOSIS — C539 Malignant neoplasm of cervix uteri, unspecified: Secondary | ICD-10-CM | POA: Diagnosis not present

## 2020-10-15 DIAGNOSIS — Z452 Encounter for adjustment and management of vascular access device: Secondary | ICD-10-CM | POA: Diagnosis not present

## 2020-10-15 MED ORDER — HEPARIN SOD (PORK) LOCK FLUSH 100 UNIT/ML IV SOLN
500.0000 [IU] | Freq: Once | INTRAVENOUS | Status: AC
  Administered 2020-10-15: 500 [IU]
  Filled 2020-10-15: qty 5

## 2020-10-15 MED ORDER — SODIUM CHLORIDE 0.9% FLUSH
10.0000 mL | Freq: Once | INTRAVENOUS | Status: AC
  Administered 2020-10-15: 10 mL
  Filled 2020-10-15: qty 10

## 2020-11-22 ENCOUNTER — Inpatient Hospital Stay: Payer: BC Managed Care – PPO

## 2020-11-22 ENCOUNTER — Inpatient Hospital Stay: Payer: BC Managed Care – PPO | Attending: Hematology and Oncology

## 2020-11-22 ENCOUNTER — Other Ambulatory Visit: Payer: Self-pay

## 2020-11-22 ENCOUNTER — Encounter (HOSPITAL_COMMUNITY)
Admission: RE | Admit: 2020-11-22 | Discharge: 2020-11-22 | Disposition: A | Discharge status: Home / Self Care | Payer: BC Managed Care – PPO | Source: Ambulatory Visit | Attending: Hematology and Oncology | Admitting: Hematology and Oncology

## 2020-11-22 DIAGNOSIS — Z9221 Personal history of antineoplastic chemotherapy: Secondary | ICD-10-CM | POA: Diagnosis not present

## 2020-11-22 DIAGNOSIS — C539 Malignant neoplasm of cervix uteri, unspecified: Secondary | ICD-10-CM | POA: Diagnosis not present

## 2020-11-22 DIAGNOSIS — Z6837 Body mass index (BMI) 37.0-37.9, adult: Secondary | ICD-10-CM | POA: Diagnosis not present

## 2020-11-22 DIAGNOSIS — Z87891 Personal history of nicotine dependence: Secondary | ICD-10-CM | POA: Insufficient documentation

## 2020-11-22 DIAGNOSIS — E049 Nontoxic goiter, unspecified: Secondary | ICD-10-CM | POA: Diagnosis not present

## 2020-11-22 DIAGNOSIS — I700 Atherosclerosis of aorta: Secondary | ICD-10-CM | POA: Diagnosis not present

## 2020-11-22 DIAGNOSIS — Z79899 Other long term (current) drug therapy: Secondary | ICD-10-CM | POA: Insufficient documentation

## 2020-11-22 DIAGNOSIS — E669 Obesity, unspecified: Secondary | ICD-10-CM | POA: Diagnosis not present

## 2020-11-22 DIAGNOSIS — F419 Anxiety disorder, unspecified: Secondary | ICD-10-CM | POA: Diagnosis not present

## 2020-11-22 DIAGNOSIS — Z923 Personal history of irradiation: Secondary | ICD-10-CM | POA: Insufficient documentation

## 2020-11-22 DIAGNOSIS — K3189 Other diseases of stomach and duodenum: Secondary | ICD-10-CM | POA: Diagnosis not present

## 2020-11-22 LAB — GLUCOSE, CAPILLARY: Glucose-Capillary: 98 mg/dL (ref 70–99)

## 2020-11-22 LAB — CBC WITH DIFFERENTIAL (CANCER CENTER ONLY)
Abs Immature Granulocytes: 0 10*3/uL (ref 0.00–0.07)
Basophils Absolute: 0 10*3/uL (ref 0.0–0.1)
Basophils Relative: 1 %
Eosinophils Absolute: 0.2 10*3/uL (ref 0.0–0.5)
Eosinophils Relative: 4 %
HCT: 40.1 % (ref 36.0–46.0)
Hemoglobin: 13.5 g/dL (ref 12.0–15.0)
Immature Granulocytes: 0 %
Lymphocytes Relative: 16 %
Lymphs Abs: 0.6 10*3/uL — ABNORMAL LOW (ref 0.7–4.0)
MCH: 32.1 pg (ref 26.0–34.0)
MCHC: 33.7 g/dL (ref 30.0–36.0)
MCV: 95.5 fL (ref 80.0–100.0)
Monocytes Absolute: 0.4 10*3/uL (ref 0.1–1.0)
Monocytes Relative: 9 %
Neutro Abs: 2.8 10*3/uL (ref 1.7–7.7)
Neutrophils Relative %: 70 %
Platelet Count: 213 10*3/uL (ref 150–400)
RBC: 4.2 MIL/uL (ref 3.87–5.11)
RDW: 11.8 % (ref 11.5–15.5)
WBC Count: 4 10*3/uL (ref 4.0–10.5)
nRBC: 0 % (ref 0.0–0.2)

## 2020-11-22 LAB — BASIC METABOLIC PANEL - CANCER CENTER ONLY
Anion gap: 5 (ref 5–15)
BUN: 20 mg/dL (ref 6–20)
CO2: 26 mmol/L (ref 22–32)
Calcium: 9.4 mg/dL (ref 8.9–10.3)
Chloride: 107 mmol/L (ref 98–111)
Creatinine: 0.87 mg/dL (ref 0.44–1.00)
GFR, Estimated: >60 mL/min (ref >60)
Glucose, Bld: 101 mg/dL — ABNORMAL HIGH (ref 70–99)
Potassium: 4 mmol/L (ref 3.5–5.1)
Sodium: 138 mmol/L (ref 135–145)

## 2020-11-22 LAB — MAGNESIUM: Magnesium: 2 mg/dL (ref 1.7–2.4)

## 2020-11-22 MED ORDER — SODIUM CHLORIDE 0.9% FLUSH
10.0000 mL | Freq: Once | INTRAVENOUS | Status: AC
  Administered 2020-11-22: 10:00:00 10 mL
  Filled 2020-11-22: qty 10

## 2020-11-22 MED ORDER — FLUDEOXYGLUCOSE F - 18 (FDG) INJECTION
11.5100 | Freq: Once | INTRAVENOUS | Status: AC
  Administered 2020-11-22: 10:00:00 11.51 via INTRAVENOUS

## 2020-11-22 NOTE — Progress Notes (Signed)
Port accessed using 20 G power port needle, left accessed for scan this am.  Pt aware to return to [facility] to have port needle removed if PET staff does not remove it.

## 2020-11-22 NOTE — Patient Instructions (Signed)

## 2020-11-23 ENCOUNTER — Encounter: Payer: Self-pay | Admitting: Gynecologic Oncology

## 2020-11-23 ENCOUNTER — Encounter: Payer: Self-pay | Admitting: Oncology

## 2020-11-23 NOTE — Progress Notes (Signed)
Faxed PET scan results to Dr. Jeryl Columbia per Dr. Berline Lopes to see if further workup on Thyroid is required.

## 2020-11-23 NOTE — Progress Notes (Signed)
I called the patient and reviewed her pet scan results. She's overall happy to hear this news. I will have my office contact her primary care provider in [place] for any additional follow-up of her thyroid. She scheduled to see me tomorrow for a post treatment exam.  Kelly Lopes MD

## 2020-11-24 ENCOUNTER — Encounter: Payer: Self-pay | Admitting: Gynecologic Oncology

## 2020-11-24 ENCOUNTER — Inpatient Hospital Stay: Payer: BC Managed Care – PPO | Admitting: Gynecologic Oncology

## 2020-11-24 ENCOUNTER — Other Ambulatory Visit: Payer: Self-pay | Admitting: Hematology and Oncology

## 2020-11-24 ENCOUNTER — Other Ambulatory Visit: Payer: Self-pay

## 2020-11-24 VITALS — BP 133/86 | HR 84 | Temp 98.8°F | Resp 16 | Ht 67.0 in | Wt 232.4 lb

## 2020-11-24 DIAGNOSIS — Z9221 Personal history of antineoplastic chemotherapy: Secondary | ICD-10-CM | POA: Diagnosis not present

## 2020-11-24 DIAGNOSIS — E669 Obesity, unspecified: Secondary | ICD-10-CM | POA: Diagnosis not present

## 2020-11-24 DIAGNOSIS — Z79899 Other long term (current) drug therapy: Secondary | ICD-10-CM | POA: Diagnosis not present

## 2020-11-24 DIAGNOSIS — Z6837 Body mass index (BMI) 37.0-37.9, adult: Secondary | ICD-10-CM | POA: Diagnosis not present

## 2020-11-24 DIAGNOSIS — F419 Anxiety disorder, unspecified: Secondary | ICD-10-CM | POA: Diagnosis not present

## 2020-11-24 DIAGNOSIS — C539 Malignant neoplasm of cervix uteri, unspecified: Secondary | ICD-10-CM | POA: Diagnosis not present

## 2020-11-24 DIAGNOSIS — Z923 Personal history of irradiation: Secondary | ICD-10-CM | POA: Diagnosis not present

## 2020-11-24 DIAGNOSIS — C530 Malignant neoplasm of endocervix: Secondary | ICD-10-CM

## 2020-11-24 DIAGNOSIS — Z87891 Personal history of nicotine dependence: Secondary | ICD-10-CM | POA: Diagnosis not present

## 2020-11-24 NOTE — Patient Instructions (Signed)
Things look and feel good on your exam today.  An order will be placed to get your port removed.  They will call you from radiology to reschedule that hopefully in the next several weeks.  You will see Dr. Sondra Come in March and I will see you back in June.  If you develop any symptoms such as vaginal bleeding, pelvic pain, unintentional weight loss before your next scheduled visit, please call me to come in sooner.

## 2020-11-24 NOTE — Progress Notes (Signed)
Gynecologic Oncology Return Clinic Visit  11/24/2020  Reason for Visit: Follow-up after definitive chemoradiation for cervix cancer  Treatment History: Oncology History  Cervical cancer ([place])  02/09/2020 Initial Diagnosis   Between 6-8 months ago, the patient endorses having irregular spotting intermittnetly every few weeks that would last for a few days. This did not change over time and she describes it as light spotting. This was the first bleeding she had after menopause. She ultimately saw her PCP who performed a pap in March which returned showing HSIL. The patient tells me today that this is the first pap test she has had ever. She was then referred to gyn and underwent EMB/LEEP and transvaginal ultrasound for work-up of her PMB and high grade cervical dysplasia. Unfortunately, her pathology revealed SCC of the cervix   05/03/2020 Pathology Results   Endocervical curettings show fragments of squamous epithelium with high-grade squamous intraepithelial lesion, CIN-3 as well as fragments of invasive squamous cell carcinoma within the endometrial biopsy.  The invasive squamous cell carcinoma have depth of invasion more than 3 mm, with at least 10 mm, thickness almost 1.2 mm.   05/18/2020 Imaging   1. No findings of metastatic disease to the abdomen/pelvis. 2. There is a small amount of gas either along the cervical canal or the adjacent fornix. A well-defined mass is not seen. 3. Chronic bilateral pars defects at L5 with 7 mm grade 1 anterolisthesis of L5 on S1 and resulting mild left foraminal stenosis. 4. Suspected right foraminal disc protrusion at L3-4 possibly causing mild right foraminal impingement.     05/21/2020 Cancer Staging   Staging form: Cervix Uteri, AJCC Version 9 - Clinical stage from 05/21/2020: FIGO Stage IIB (cT2b, cM0) - Signed by Heath Lark, MD on 05/21/2020   05/26/2020 PET scan   1. Intensely hypermetabolic circumferential metabolic activity in the uterine cervix  consistent with cervical carcinoma. 2. No evidence of metastatic disease in the pelvis. No metastatic lymphadenopathy. 3. No evidence of distant metastatic disease. 4. Focus of metabolic activity in the gastric antrum would be unlikely location for a cervical carcinoma metastasis. Favor gastritis or potentially primary gastric neoplasm. Consider upper endoscopy for further evaluation. 5. Diffuse activity in the thyroid gland is favored thyroiditis.   06/03/2020 Procedure   Status post right IJ port catheter.   06/07/2020 - 07/07/2020 Chemotherapy   The patient had weekly cisplatin for chemotherapy treatment.     06/07/2020 - 08/23/2020 Radiation Therapy    Radiation Treatment Dates: 06/07/2020 through 07/19/2020 Site Technique Total Dose (Gy) Dose per Fx (Gy) Completed Fx Beam Energies  Cervix: Cervix IMRT 45/45 1.8 25/25 6X  Cervix: Cervix_Bst 3D 9/9 1.8 5/5 15X    HDR brachytherapy: 07/26/2020, 08/06/2020, 08/09/2020, 08/17/2020, 08/23/2020     11/22/2020 PET scan   Marked interval response to therapy with no visible mass at the site of previous ill-defined thickening of the cervix, and marked interval decrease in FDG uptake associated with this area as outlined above.   No signs of new disease in the neck, chest, abdomen or of the pelvis   Markedly hypermetabolic thyroid gland likely related to thyroiditis is minimally changed, correlate with thyroid function. Given asymmetric enlargement of the RIGHT thyroid lobe would also suggest thyroid ultrasound for further evaluation.       Interval History: Patient reports doing well since finishing treatment.  Her appetite has been increased and she has gained about 10 pounds.  She attributes this to eating a lot of cereal with milk.  She denies any vaginal bleeding or discharge with the exception of some very light spotting once when she used her dilator.  She has only used the dilator about 3 times since finishing radiation.  She denies any  urinary symptoms.  She continues to have diarrhea intermittently at baseline which seems dependent on what she is eating.  Certain foods such as fruit or milk seem to worsen her diarrhea.  Her diarrhea was also worse during radiation but she is almost back to her baseline now.  Past Medical/Surgical History: Past Medical History:  Diagnosis Date  . Anxiety   . Cervical cancer ([place])   . Obesity (BMI 30-39.9)   . Palpitations     Past Surgical History:  Procedure Laterality Date  . IR IMAGING GUIDED PORT INSERTION  06/03/2020  . OPERATIVE ULTRASOUND N/A 07/26/2020   Procedure: OPERATIVE ULTRASOUND;  Surgeon: Gery Pray, MD;  Location: [facility];  Service: Urology;  Laterality: N/A;  . OPERATIVE ULTRASOUND N/A 08/06/2020   Procedure: OPERATIVE ULTRASOUND;  Surgeon: Gery Pray, MD;  Location: [facility];  Service: Urology;  Laterality: N/A;  . OPERATIVE ULTRASOUND N/A 08/09/2020   Procedure: OPERATIVE ULTRASOUND;  Surgeon: Gery Pray, MD;  Location: [facility];  Service: Urology;  Laterality: N/A;  . OPERATIVE ULTRASOUND N/A 08/17/2020   Procedure: OPERATIVE ULTRASOUND;  Surgeon: Gery Pray, MD;  Location: [facility];  Service: Urology;  Laterality: N/A;  . OPERATIVE ULTRASOUND N/A 08/23/2020   Procedure: OPERATIVE ULTRASOUND;  Surgeon: Gery Pray, MD;  Location: [facility];  Service: Urology;  Laterality: N/A;  . TANDEM RING INSERTION  08/06/2020  . TANDEM RING INSERTION N/A 07/26/2020   Procedure: TANDEM RING INSERTION FOR HIGH DOSE RADIATION THERAPY;  Surgeon: Gery Pray, MD;  Location: [facility];  Service: Urology;  Laterality: N/A;  . TANDEM RING INSERTION N/A 08/06/2020   Procedure: TANDEM RING INSERTION;  Surgeon: Gery Pray, MD;  Location: [facility];  Service: Urology;  Laterality: N/A;  . TANDEM RING INSERTION N/A 08/09/2020   Procedure: TANDEM RING  INSERTION;  Surgeon: Gery Pray, MD;  Location: [facility];  Service: Urology;  Laterality: N/A;  . TANDEM RING INSERTION N/A 08/17/2020   Procedure: TANDEM RING INSERTION;  Surgeon: Gery Pray, MD;  Location: [facility];  Service: Urology;  Laterality: N/A;  . TANDEM RING INSERTION N/A 08/23/2020   Procedure: TANDEM RING INSERTION;  Surgeon: Gery Pray, MD;  Location: [facility];  Service: Urology;  Laterality: N/A;  . THERAPEUTIC ABORTION      Family History  Problem Relation Age of Onset  . Stroke Maternal Grandmother   . Colon cancer Neg Hx   . Breast cancer Neg Hx   . Endometrial cancer Neg Hx   . Ovarian cancer Neg Hx     Social History   Socioeconomic History  . Marital status: Married    Spouse name: Dominica Severin  . Number of children: 0  . Years of education: Not on file  . Highest education level: Not on file  Occupational History  . Occupation: Optometrist  Tobacco Use  . Smoking status: Former Research scientist (life sciences)  . Smokeless tobacco: Never Used  . Tobacco comment: quit 2019  Vaping Use  . Vaping Use: Never used  Substance and Sexual Activity  . Alcohol use: Not Currently  . Drug use: Never  . Sexual activity: Not on file  Other Topics Concern  . Not  on file  Social History Narrative  . Not on file   Social Determinants of Health   Financial Resource Strain: Not on file  Food Insecurity: Not on file  Transportation Needs: Not on file  Physical Activity: Not on file  Stress: Not on file  Social Connections: Not on file    Current Medications:  Current Outpatient Medications:  .  acetaminophen (TYLENOL) 325 MG tablet, Take 650 mg by mouth every 6 (six) hours as needed for moderate pain. , Disp: , Rfl:  .  diphenhydramine-acetaminophen (TYLENOL PM) 25-500 MG TABS tablet, Take 1 tablet by mouth at bedtime as needed (sleep). , Disp: , Rfl:  .  escitalopram (LEXAPRO) 5 MG tablet, Take 5 mg by mouth daily., Disp: , Rfl:  .   diphenhydrAMINE (BENADRYL) 25 MG tablet, Take 25 mg by mouth every 6 (six) hours as needed for itching or allergies. (Patient not taking: Reported on 11/23/2020), Disp: , Rfl:   Review of Systems: Positive for appetite changes, diarrhea, hot flashes, back pain, anxiety. Denies fevers, chills, fatigue, unexplained weight changes. Denies hearing loss, neck lumps or masses, mouth sores, ringing in ears or voice changes. Denies cough or wheezing.  Denies shortness of breath. Denies chest pain or palpitations. Denies leg swelling. Denies abdominal distention, pain, blood in stools, constipation, nausea, vomiting, or early satiety. Denies pain with intercourse, dysuria, frequency, hematuria or incontinence. Denies pelvic pain, vaginal bleeding or vaginal discharge.   Denies joint pain or muscle pain/cramps. Denies itching, rash, or wounds. Denies dizziness, headaches, numbness or seizures. Denies swollen lymph nodes or glands, denies easy bruising or bleeding. Denies depression, confusion, or decreased concentration.  Physical Exam: BP 133/86 (BP Location: Left Arm, Patient Position: Sitting)   Pulse 84   Temp 98.8 F (37.1 C) (Tympanic)   Resp 16   Ht 5\' 7"  (1.702 m)   Wt 232 lb 6.4 oz (105.4 kg)   SpO2 97%   BMI 36.40 kg/m  General: Alert, oriented, no acute distress. HEENT: Number cephalic, atraumatic, sclera anicteric. Chest: Unlabored breathing on room air. Abdomen: Obese, soft, nontender.  Normoactive bowel sounds.  No masses or hepatosplenomegaly appreciated.   Extremities: Grossly normal range of motion.  Warm, well perfused.  No edema bilaterally. Skin: No rashes or lesions noted. Lymphatics: No cervical, supraclavicular, or inguinal adenopathy. GU: Normal appearing external genitalia without erythema, excoriation, or lesions.  Speculum exam reveals mild to moderately atrophic vaginal mucosa.  Some very minor agglutination and radiation changes noted at the top of the vagina  and cervix.  No masses or lesions noted.  On bimanual exam, very mild firmness of the cervix noted, no masses or nodularity.  Rectovaginal exam no nodularity or parametrial thickening appreciated.  Laboratory & Radiologic Studies: Pet scan on 12/13: IMPRESSION: Marked interval response to therapy with no visible mass at the site of previous ill-defined thickening of the cervix, and marked interval decrease in FDG uptake associated with this area as outlined above.  No signs of new disease in the neck, chest, abdomen or of the pelvis  Markedly hypermetabolic thyroid gland likely related to thyroiditis is minimally changed, correlate with thyroid function. Given asymmetric enlargement of the RIGHT thyroid lobe would also suggest thyroid ultrasound for further evaluation.  Assessment & Plan: Kelly Hanson is a 59 y.o. woman with Stage IIB squamous cell carcinoma of the cervix who is now status post definitive chemoradiation who appears to be NED on exam today.  Patient is doing well and has recovered from  radiation without significant side effects.  She has no evidence of active disease on her exam today and her recent PET scan is overall reassuring.  I think it is reasonable to move forward with taking out her port, which she is very eager to do.  We discussed signs and symptoms that would be concerning for disease recurrence.  The patient knows to call if she has any of these to be seen in clinic.  She will alternate visits between my clinic and radiation oncology every 3 months for the first couple of years after treatment.  Plan will be, per NCCN and SGO guidelines, to perform a Pap test yearly.  Imaging will only be performed for concerning symptoms or exam findings that raise suspicion for recurrence.  Patient was given a survivorship packet today which was reviewed in depth with her by Joylene John, nurse practitioner.  28 minutes of total time was spent for this patient encounter,  including preparation, face-to-face counseling with the patient and coordination of care, and documentation of the encounter.  Jeral Pinch, MD  Division of Gynecologic Oncology  Department of Obstetrics and Gynecology  [facility]

## 2020-12-13 ENCOUNTER — Other Ambulatory Visit: Payer: Self-pay | Admitting: Radiology

## 2020-12-14 DIAGNOSIS — Z6837 Body mass index (BMI) 37.0-37.9, adult: Secondary | ICD-10-CM | POA: Diagnosis not present

## 2020-12-14 DIAGNOSIS — F4322 Adjustment disorder with anxiety: Secondary | ICD-10-CM | POA: Diagnosis not present

## 2020-12-14 DIAGNOSIS — E049 Nontoxic goiter, unspecified: Secondary | ICD-10-CM | POA: Diagnosis not present

## 2020-12-14 DIAGNOSIS — R635 Abnormal weight gain: Secondary | ICD-10-CM | POA: Diagnosis not present

## 2020-12-15 ENCOUNTER — Other Ambulatory Visit: Payer: Self-pay

## 2020-12-15 ENCOUNTER — Encounter (HOSPITAL_COMMUNITY): Payer: Self-pay

## 2020-12-15 ENCOUNTER — Ambulatory Visit (HOSPITAL_COMMUNITY)
Admission: RE | Admit: 2020-12-15 | Discharge: 2020-12-15 | Disposition: A | Discharge status: Home / Self Care | Payer: BC Managed Care – PPO | Source: Ambulatory Visit | Attending: Hematology and Oncology | Admitting: Hematology and Oncology

## 2020-12-15 DIAGNOSIS — C539 Malignant neoplasm of cervix uteri, unspecified: Secondary | ICD-10-CM | POA: Insufficient documentation

## 2020-12-15 DIAGNOSIS — Z9221 Personal history of antineoplastic chemotherapy: Secondary | ICD-10-CM | POA: Insufficient documentation

## 2020-12-15 DIAGNOSIS — Z8541 Personal history of malignant neoplasm of cervix uteri: Secondary | ICD-10-CM | POA: Diagnosis not present

## 2020-12-15 DIAGNOSIS — C530 Malignant neoplasm of endocervix: Secondary | ICD-10-CM

## 2020-12-15 DIAGNOSIS — Z452 Encounter for adjustment and management of vascular access device: Secondary | ICD-10-CM | POA: Diagnosis not present

## 2020-12-15 DIAGNOSIS — Z923 Personal history of irradiation: Secondary | ICD-10-CM | POA: Insufficient documentation

## 2020-12-15 HISTORY — PX: IR REMOVAL TUN ACCESS W/ PORT W/O FL MOD SED: IMG2290

## 2020-12-15 LAB — CBC WITH DIFFERENTIAL/PLATELET
Abs Immature Granulocytes: 0.01 10*3/uL (ref 0.00–0.07)
Basophils Absolute: 0 10*3/uL (ref 0.0–0.1)
Basophils Relative: 1 %
Eosinophils Absolute: 0.1 10*3/uL (ref 0.0–0.5)
Eosinophils Relative: 3 %
HCT: 40.5 % (ref 36.0–46.0)
Hemoglobin: 13.5 g/dL (ref 12.0–15.0)
Immature Granulocytes: 0 %
Lymphocytes Relative: 19 %
Lymphs Abs: 0.8 10*3/uL (ref 0.7–4.0)
MCH: 32.1 pg (ref 26.0–34.0)
MCHC: 33.3 g/dL (ref 30.0–36.0)
MCV: 96.4 fL (ref 80.0–100.0)
Monocytes Absolute: 0.4 10*3/uL (ref 0.1–1.0)
Monocytes Relative: 10 %
Neutro Abs: 2.7 10*3/uL (ref 1.7–7.7)
Neutrophils Relative %: 67 %
Platelets: 197 10*3/uL (ref 150–400)
RBC: 4.2 MIL/uL (ref 3.87–5.11)
RDW: 12.2 % (ref 11.5–15.5)
WBC: 4.1 10*3/uL (ref 4.0–10.5)
nRBC: 0 % (ref 0.0–0.2)

## 2020-12-15 LAB — PROTIME-INR
INR: 1 (ref 0.8–1.2)
Prothrombin Time: 13.1 seconds (ref 11.4–15.2)

## 2020-12-15 MED ORDER — SODIUM CHLORIDE 0.9 % IV SOLN: INTRAVENOUS | Status: DC

## 2020-12-15 MED ORDER — MIDAZOLAM HCL 2 MG/2ML IJ SOLN
INTRAMUSCULAR | Status: AC | PRN
  Administered 2020-12-15 (×2): 1 mg via INTRAVENOUS
  Administered 2020-12-15: 2 mg via INTRAVENOUS

## 2020-12-15 MED ORDER — CEFAZOLIN SODIUM-DEXTROSE 2-4 GM/100ML-% IV SOLN: 2.0000 g | INTRAVENOUS | Status: AC

## 2020-12-15 MED ORDER — MIDAZOLAM HCL 2 MG/2ML IJ SOLN
INTRAMUSCULAR | Status: AC
  Filled 2020-12-15: qty 4

## 2020-12-15 MED ORDER — FENTANYL CITRATE (PF) 100 MCG/2ML IJ SOLN
INTRAMUSCULAR | Status: AC | PRN
  Administered 2020-12-15 (×2): 50 ug via INTRAVENOUS

## 2020-12-15 MED ORDER — CEFAZOLIN SODIUM-DEXTROSE 2-4 GM/100ML-% IV SOLN
INTRAVENOUS | Status: AC
  Administered 2020-12-15: 2 g via INTRAVENOUS
  Filled 2020-12-15: qty 100

## 2020-12-15 MED ORDER — FENTANYL CITRATE (PF) 100 MCG/2ML IJ SOLN
INTRAMUSCULAR | Status: AC
  Filled 2020-12-15: qty 2

## 2020-12-15 MED ORDER — LIDOCAINE HCL 1 % IJ SOLN
INTRAMUSCULAR | Status: AC
  Filled 2020-12-15: qty 20

## 2020-12-15 MED ORDER — LIDOCAINE HCL (PF) 1 % IJ SOLN
INTRAMUSCULAR | Status: AC | PRN
  Administered 2020-12-15: 10 mL

## 2020-12-15 NOTE — Discharge Instructions (Signed)
For problems and concerns, please call Interventional Radiology after hours at [phone]  Incision Care, Adult An incision is a surgical cut that is made through your skin. Most incisions are closed after surgery. Your incision may be closed with stitches (sutures), staples, skin glue, or adhesive strips. You may need to return to your health care provider to have sutures or staples removed. This may occur several days to several weeks after your surgery. The incision needs to be cared for properly to prevent infection. How to care for your incision Incision care   Follow instructions from your health care provider about how to take care of your incision. Make sure you: ? Wash your hands with soap and water before you change the bandage (dressing). If soap and water are not available, use hand sanitizer. ? You may remove your dressing in 24 hours and shower. ? Leave sutures, skin glue, or adhesive strips in place. These skin closures may need to stay in place for 2 weeks or longer. If adhesive strip edges start to loosen and curl up, you may trim the loose edges. Do not remove adhesive strips completely unless your health care provider tells you to do that.  Check your incision area every day for signs of infection. Check for: ? More redness, swelling, or pain. ? More fluid or blood. ? Warmth. ? Pus or a bad smell.  Ask your health care provider how to clean the incision. This may include: ? Using mild soap and water. ? Using a clean towel to pat the incision dry after cleaning it. ? Applying a cream or ointment. Do this only as told by your health care provider. ? Covering the incision with a clean dressing.  Ask your health care provider when you can leave the incision uncovered.  Do not take baths, swim, or use a hot tub until your health care provider approves. Ask your health care provider if you can take showers. You may only be allowed to take sponge baths for  bathing. Medicines  If you were prescribed an antibiotic medicine, cream, or ointment, take or apply the antibiotic as told by your health care provider. Do not stop taking or applying the antibiotic even if your condition improves.  Take over-the-counter and prescription medicines only as told by your health care provider. General instructions  Limit movement around your incision to improve healing. ? Avoid straining, lifting, or exercise for the first month, or for as long as told by your health care provider. ? Follow instructions from your health care provider about returning to your normal activities. ? Ask your health care provider what activities are safe.  Protect your incision from the sun when you are outside for the first 6 months, or for as long as told by your health care provider. Apply sunscreen around the scar or cover it up.  Keep all follow-up visits as told by your health care provider. This is important. Contact a health care provider if:  Your have more redness, swelling, or pain around the incision.  You have more fluid or blood coming from the incision.  Your incision feels warm to the touch.  You have pus or a bad smell coming from the incision.  You have a fever or shaking chills.  You are nauseous or you vomit.  You are dizzy.  Your sutures or staples come undone. Get help right away if:  You have a red streak coming from your incision.  Your incision bleeds through the dressing  and the bleeding does not stop with gentle pressure.  The edges of your incision open up and separate.  You have severe pain.  You have a rash.  You are confused.  You faint.  You have trouble breathing and a fast heartbeat. This information is not intended to replace advice given to you by your health care provider. Make sure you discuss any questions you have with your health care provider. Document Revised: 11/29/2018 Document Reviewed: 06/14/2016 Elsevier  Patient Education  [place].    Moderate Conscious Sedation, Adult, Care After These instructions provide you with information about caring for yourself after your procedure. Your health care provider may also give you more specific instructions. Your treatment has been planned according to current medical practices, but problems sometimes occur. Call your health care provider if you have any problems or questions after your procedure. What can I expect after the procedure? After your procedure, it is common:  To feel sleepy for several hours.  To feel clumsy and have poor balance for several hours.  To have poor judgment for several hours.  To vomit if you eat too soon. Follow these instructions at home: For at least 24 hours after the procedure:   Do not: ? Participate in activities where you could fall or become injured. ? Drive. ? Use heavy machinery. ? Drink alcohol. ? Take sleeping pills or medicines that cause drowsiness. ? Make important decisions or sign legal documents. ? Take care of children on your own.  Rest. Eating and drinking  Follow the diet recommended by your health care provider.  If you vomit: ? Drink water, juice, or soup when you can drink without vomiting. ? Make sure you have little or no nausea before eating solid foods. General instructions  Have a responsible adult stay with you until you are awake and alert.  Take over-the-counter and prescription medicines only as told by your health care provider.  If you smoke, do not smoke without supervision.  Keep all follow-up visits as told by your health care provider. This is important. Contact a health care provider if:  You keep feeling nauseous or you keep vomiting.  You feel light-headed.  You develop a rash.  You have a fever. Get help right away if:  You have trouble breathing. This information is not intended to replace advice given to you by your health care provider.  Make sure you discuss any questions you have with your health care provider. Document Revised: 11/09/2017 Document Reviewed: 03/18/2016 Elsevier Patient Education  2020 [employer].

## 2020-12-15 NOTE — Procedures (Signed)
Interventional Radiology Procedure Note  Procedure: Port removal  Complications: None  Estimated Blood Loss: < 10 mL  Findings: Right chest port and catheter removed in entirety. Wound closed.  Jodi Marble. Fredia Sorrow, M.D Pager:  [phone]

## 2020-12-15 NOTE — H&P (Signed)
Referring Physician(s): Artis Delay  Supervising Physician: Irish Lack  Patient Status:  [facility] OP  Chief Complaint: "I'm getting my port out"   Subjective: Patient familiar to IR service from Port-A-Cath placement on 06/03/2020. She has a history of cervical cancer and has completed chemoradiation.  She presents again today for Port-A-Cath removal.  She currently denies fever, chest pain, dyspnea, cough, abdominal pain, back pain, nausea, vomiting or bleeding.  She does have a mild headache and is anxious.   Past Medical History:  Diagnosis Date  . Anxiety   . Cervical cancer ([facility])   . Obesity (BMI 30-39.9)   . Palpitations    Past Surgical History:  Procedure Laterality Date  . IR IMAGING GUIDED PORT INSERTION  06/03/2020  . OPERATIVE ULTRASOUND N/A 07/26/2020   Procedure: OPERATIVE ULTRASOUND;  Surgeon: Antony Blackbird, MD;  Location: [facility];  Service: Urology;  Laterality: N/A;  . OPERATIVE ULTRASOUND N/A 08/06/2020   Procedure: OPERATIVE ULTRASOUND;  Surgeon: Antony Blackbird, MD;  Location: [facility];  Service: Urology;  Laterality: N/A;  . OPERATIVE ULTRASOUND N/A 08/09/2020   Procedure: OPERATIVE ULTRASOUND;  Surgeon: Antony Blackbird, MD;  Location: [facility];  Service: Urology;  Laterality: N/A;  . OPERATIVE ULTRASOUND N/A 08/17/2020   Procedure: OPERATIVE ULTRASOUND;  Surgeon: Antony Blackbird, MD;  Location: [facility];  Service: Urology;  Laterality: N/A;  . OPERATIVE ULTRASOUND N/A 08/23/2020   Procedure: OPERATIVE ULTRASOUND;  Surgeon: Antony Blackbird, MD;  Location: [facility];  Service: Urology;  Laterality: N/A;  . TANDEM RING INSERTION  08/06/2020  . TANDEM RING INSERTION N/A 07/26/2020   Procedure: TANDEM RING INSERTION FOR HIGH DOSE RADIATION THERAPY;  Surgeon: Antony Blackbird, MD;  Location: [facility];  Service: Urology;  Laterality: N/A;  . TANDEM RING INSERTION N/A  08/06/2020   Procedure: TANDEM RING INSERTION;  Surgeon: Antony Blackbird, MD;  Location: [facility];  Service: Urology;  Laterality: N/A;  . TANDEM RING INSERTION N/A 08/09/2020   Procedure: TANDEM RING INSERTION;  Surgeon: Antony Blackbird, MD;  Location: [facility];  Service: Urology;  Laterality: N/A;  . TANDEM RING INSERTION N/A 08/17/2020   Procedure: TANDEM RING INSERTION;  Surgeon: Antony Blackbird, MD;  Location: [facility];  Service: Urology;  Laterality: N/A;  . TANDEM RING INSERTION N/A 08/23/2020   Procedure: TANDEM RING INSERTION;  Surgeon: Antony Blackbird, MD;  Location: [facility];  Service: Urology;  Laterality: N/A;  . THERAPEUTIC ABORTION        Allergies: Patient has no known allergies.  Medications: Prior to Admission medications   Medication Sig Start Date End Date Taking? Authorizing Provider  diphenhydramine-acetaminophen (TYLENOL PM) 25-500 MG TABS tablet Take 1 tablet by mouth at bedtime as needed (sleep).    Yes [provider]  escitalopram (LEXAPRO) 5 MG tablet Take 5 mg by mouth daily. 03/23/20  Yes [provider]  acetaminophen (TYLENOL) 325 MG tablet Take 650 mg by mouth every 6 (six) hours as needed for moderate pain.     [provider]  diphenhydrAMINE (BENADRYL) 25 MG tablet Take 25 mg by mouth every 6 (six) hours as needed for itching or allergies.    [provider]     Vital Signs: BP (!) 150/92 (BP Location: Right Arm)   Pulse 87   Temp 98.2 F (36.8 C) (Oral)   Resp 18   Ht 5\' 7"  (1.702 m)  Wt 234 lb (106.1 kg)   SpO2 100%   BMI 36.65 kg/m   Physical Exam awake, alert.  Chest clear to auscultation bilaterally.  Clean, intact right chest wall Port-A-Cath.  Heart with regular rate and rhythm.  Abdomen soft, positive bowel sounds, nontender.  No significant lower extremity edema.  Erythematous rash noted around neck and upper chest region which patient  attributes to her anxiety.  Imaging: No results found.  Labs:  CBC: Recent Labs    07/19/20 1437 09/03/20 0914 11/22/20 0946 12/15/20 1215  WBC 2.9* 4.3 4.0 4.1  HGB 12.1 12.3 13.5 13.5  HCT 35.7* 36.2 40.1 40.5  PLT 124* 203 213 197    COAGS: Recent Labs    06/03/20 0822 12/15/20 1215  INR 1.1 1.0    BMP: Recent Labs    06/11/20 1257 06/21/20 1205 06/28/20 1200 07/05/20 1202 11/22/20 0946  NA 136 137 138 136 138  K 4.0 4.2 3.8 4.7 4.0  CL 102 104 104 102 107  CO2 24 24 25 23 26   GLUCOSE 102* 87 126* 77 101*  BUN 13 15 19 18 20   CALCIUM 9.4 9.3 9.5 9.5 9.4  CREATININE 0.84 0.75 0.81 0.79 0.87  GFRNONAA >60 >60 >60 >60 >60  GFRAA >60 >60 >60 >60  --     LIVER FUNCTION TESTS: Recent Labs    05/14/20 1211 06/03/20 0822  BILITOT 0.7 0.8  AST 20 18  ALT 31 21  ALKPHOS 73 63  PROT 7.4 7.3  ALBUMIN 4.2 4.7    Assessment and Plan: Patient familiar to IR service from Port-A-Cath placement on 06/03/2020. She has a history of cervical cancer and has completed chemoradiation.  She presents again today for Port-A-Cath removal.  Details/risks of procedure, including but not limited to, internal bleeding, infection, injury to adjacent structures discussed with patient with her understanding and consent.   Electronically Signed: D. Rowe Robert, PA-C 12/15/2020, 12:39 PM   I spent a total of 20 minutes at the the patient's bedside AND on the patient's hospital floor or unit, greater than 50% of which was counseling/coordinating care for Port-A-Cath removal

## 2020-12-21 DIAGNOSIS — E079 Disorder of thyroid, unspecified: Secondary | ICD-10-CM | POA: Diagnosis not present

## 2020-12-21 DIAGNOSIS — E049 Nontoxic goiter, unspecified: Secondary | ICD-10-CM | POA: Diagnosis not present

## 2021-01-07 DIAGNOSIS — F4322 Adjustment disorder with anxiety: Secondary | ICD-10-CM | POA: Diagnosis not present

## 2021-01-07 DIAGNOSIS — E063 Autoimmune thyroiditis: Secondary | ICD-10-CM | POA: Diagnosis not present

## 2021-01-07 DIAGNOSIS — R635 Abnormal weight gain: Secondary | ICD-10-CM | POA: Diagnosis not present

## 2021-02-17 ENCOUNTER — Encounter: Payer: Self-pay | Admitting: *Deleted

## 2021-02-24 ENCOUNTER — Encounter: Payer: Self-pay | Admitting: Radiation Oncology

## 2021-02-24 ENCOUNTER — Other Ambulatory Visit: Payer: Self-pay

## 2021-02-24 ENCOUNTER — Ambulatory Visit
Admission: RE | Admit: 2021-02-24 | Discharge: 2021-02-24 | Disposition: A | Discharge status: Home / Self Care | Payer: BC Managed Care – PPO | Source: Ambulatory Visit | Attending: Radiation Oncology | Admitting: Radiation Oncology

## 2021-02-24 ENCOUNTER — Telehealth: Payer: Self-pay | Admitting: *Deleted

## 2021-02-24 VITALS — BP 137/99 | HR 88 | Temp 97.1°F | Resp 18 | Ht 67.0 in | Wt 236.2 lb

## 2021-02-24 DIAGNOSIS — Z923 Personal history of irradiation: Secondary | ICD-10-CM | POA: Diagnosis not present

## 2021-02-24 DIAGNOSIS — Z79899 Other long term (current) drug therapy: Secondary | ICD-10-CM | POA: Diagnosis not present

## 2021-02-24 DIAGNOSIS — Z8541 Personal history of malignant neoplasm of cervix uteri: Secondary | ICD-10-CM | POA: Diagnosis not present

## 2021-02-24 DIAGNOSIS — E079 Disorder of thyroid, unspecified: Secondary | ICD-10-CM | POA: Insufficient documentation

## 2021-02-24 DIAGNOSIS — C530 Malignant neoplasm of endocervix: Secondary | ICD-10-CM

## 2021-02-24 DIAGNOSIS — R197 Diarrhea, unspecified: Secondary | ICD-10-CM | POA: Insufficient documentation

## 2021-02-24 DIAGNOSIS — Z8542 Personal history of malignant neoplasm of other parts of uterus: Secondary | ICD-10-CM | POA: Diagnosis not present

## 2021-02-24 DIAGNOSIS — Z08 Encounter for follow-up examination after completed treatment for malignant neoplasm: Secondary | ICD-10-CM | POA: Diagnosis not present

## 2021-02-24 HISTORY — DX: Disorder of thyroid, unspecified: E07.9

## 2021-02-24 NOTE — Telephone Encounter (Signed)
XXXX 

## 2021-02-24 NOTE — Progress Notes (Signed)
Radiation Oncology         [phone]) [phone] ________________________________  Name: Kelly Hanson MRN: [id number]  Date: 02/24/2021  DOB: 01-05-1961  Follow-Up Visit Note  CC: [name], MD  [name], MD    ICD-10-CM   1. Malignant neoplasm of endocervix [facility])  C53.0     Diagnosis: Clinical stage IIB squamous cell carcinoma of the cervix  Interval Since Last Radiation: Six months, one week, and one day  Radiation Treatment Dates: 06/07/2020 through 07/19/2020 Site Technique Total Dose (Gy) Dose per Fx (Gy) Completed Fx Beam Energies  Cervix: Cervix IMRT 45/45 1.8 25/25 6X  Cervix: Cervix_Bst 3D 9/9 1.8 5/5 15X   HDR brachytherapy: 07/26/2020, 08/06/2020, 08/09/2020, 08/17/2020, 08/23/2020  Narrative:  The patient returns today for routine follow-up. Since her last visit, she underwent a PET scan on 11/22/2020 that showed marked interval response to therapy with no visible mass at the site of the previous ill-defined thickening of the cervix, and marked interval decrease in FDG uptake associated with that area. There were no signs of new disease in the neck, chest, abdomen, or pelvis. The markedly hypermetabolic thyroid gland was likely related to thyroiditis and was minimally unchanged. Given asymmetric enlargement of the right thyroid lobe, thyroid ultrasound was recommended for further evaluation.  Patient was recently started on thyroid medication by her primary care physician.  The patient was last seen by Dr. Berline Lopes on 11/24/2020, at which time there was no evidence of active disease on examination.  On review of systems, she reports using her vaginal dilator approximately once per week. She denies she has some mild spotting with this procedure but no active bleeding.  Encouraged her to increase the frequency of using her vaginal dilator.  Her diarrhea is back to baseline.  She reports diarrhea sometimes with dairy products.  She reports being placed on 2 antidepressants by  her primary care physician to help aid in weight loss but actually caused her to gain weight and these have been stopped.  Patient is walking for exercise.  She is happy to have her Port-A-Cath removed.  ALLERGIES:  has No Known Allergies.  Meds: Current Outpatient Medications  Medication Sig Dispense Refill  . acetaminophen (TYLENOL) 325 MG tablet Take 650 mg by mouth every 6 (six) hours as needed for moderate pain.     . diphenhydrAMINE (BENADRYL) 25 MG tablet Take 25 mg by mouth every 6 (six) hours as needed for itching or allergies.    . diphenhydramine-acetaminophen (TYLENOL PM) 25-500 MG TABS tablet Take 1 tablet by mouth at bedtime as needed (sleep).     Marland Kitchen escitalopram (LEXAPRO) 5 MG tablet Take 5 mg by mouth daily.    Marland Kitchen levothyroxine (SYNTHROID) 75 MCG tablet Take 75 mcg by mouth daily.     No current facility-administered medications for this encounter.    Physical Findings: The patient is in no acute distress. Patient is alert and oriented.  height is 5\' 7"  (1.702 m) and weight is 236 lb 4 oz (107.2 kg). Her temporal temperature is 97.1 F (36.2 C) (abnormal). Her blood pressure is 137/99 (abnormal) and her pulse is 88. Her respiration is 18 and oxygen saturation is 99%.   Lungs are clear to auscultation bilaterally. Heart has regular rate and rhythm. No palpable cervical, supraclavicular, or axillary adenopathy. Abdomen soft, non-tender, normal bowel sounds. On pelvic examination the external genitalia were unremarkable. A speculum exam was performed.  Mild radiation changes noted in the proximal vagina.  No appreciable agglutination  at this time.  along the anterior lip of the cervix there is approximately a 2 to 3 mm area of necrosis. On bimanual and rectovaginal examination there were no pelvic masses appreciated.  Patient overall tolerated the pelvic exam well.  Lab Findings: Lab Results  Component Value Date   WBC 4.1 12/15/2020   HGB 13.5 12/15/2020   HCT 40.5 12/15/2020    MCV 96.4 12/15/2020   PLT 197 12/15/2020    Radiographic Findings: No results found.  Impression: Clinical stage IIB squamous cell carcinoma of the cervix  No clinical evidence of recurrence on exam today.  Possible small area of radionecrosis along the anterior lip of the cervix.  Recent PET scan showed marked interval response to therapy with no visible mass at the site of the previous ill-defined thickening of the cervix, and marked interval decrease in FDG uptake associated with that area. There were no signs of new disease in the neck, chest, abdomen, or pelvis.   Plan: The patient will follow-up with Dr. Berline Lopes in three months and with radiation oncology in six months.  Total time spent in this encounter was 25 minutes which included reviewing the patient's most recent PET scan, follow-up with Dr. Berline Lopes, physical examination, and documentation. ____________________________________   Blair Promise, PhD, MD  This document serves as a record of services personally performed by Gery Pray, MD. It was created on his behalf by Clerance Lav, a trained medical scribe. The creation of this record is based on the scribe's personal observations and the provider's statements to them. This document has been checked and approved by the attending provider.

## 2021-02-24 NOTE — Progress Notes (Signed)
Patient is present for follow up to radiation to the cervis completed 07/19/2020. Patient denies pain. Level of fatigue has returned to baseline. Denies diarrhea or constipation. No nausea or vomiting. Denies bladder symptoms. Denies vaginal or rectal bleeding or discharge. Denies skin irritation. Patient reports being sexually active and using the vaginal dilator at least 3 times per week. Last pelvic exam was uncomfortable but not painful and tolerable.  Vitals:   02/24/21 0916  BP: (!) 137/99  Pulse: 88  Resp: 18  Temp: (!) 97.1 F (36.2 C)  TempSrc: Temporal  SpO2: 99%  Weight: 107.2 kg  Height: 5\' 7"  (1.702 m)

## 2021-02-28 DIAGNOSIS — F4322 Adjustment disorder with anxiety: Secondary | ICD-10-CM | POA: Diagnosis not present

## 2021-02-28 DIAGNOSIS — E063 Autoimmune thyroiditis: Secondary | ICD-10-CM | POA: Diagnosis not present

## 2021-02-28 DIAGNOSIS — Z131 Encounter for screening for diabetes mellitus: Secondary | ICD-10-CM | POA: Diagnosis not present

## 2021-02-28 DIAGNOSIS — C539 Malignant neoplasm of cervix uteri, unspecified: Secondary | ICD-10-CM | POA: Diagnosis not present

## 2021-05-23 ENCOUNTER — Encounter: Payer: Self-pay | Admitting: Gynecologic Oncology

## 2021-05-23 NOTE — Progress Notes (Signed)
Gynecologic Oncology Return Clinic Visit  05/23/21  Reason for Visit: surveillance visit in the setting of chemoradiation for cervix cancer  Treatment History: Oncology History  Cervical cancer ([place])  02/09/2020 Initial Diagnosis   Between 6-8 months ago, the patient endorses having irregular spotting intermittnetly every few weeks that would last for a few days. This did not change over time and she describes it as light spotting. This was the first bleeding she had after menopause. She ultimately saw her PCP who performed a pap in March which returned showing HSIL. The patient tells me today that this is the first pap test she has had ever. She was then referred to gyn and underwent EMB/LEEP and transvaginal ultrasound for work-up of her PMB and high grade cervical dysplasia. Unfortunately, her pathology revealed SCC of the cervix   05/03/2020 Pathology Results   Endocervical curettings show fragments of squamous epithelium with high-grade squamous intraepithelial lesion, CIN-3 as well as fragments of invasive squamous cell carcinoma within the endometrial biopsy.  The invasive squamous cell carcinoma have depth of invasion more than 3 mm, with at least 10 mm, thickness almost 1.2 mm.   05/18/2020 Imaging   1. No findings of metastatic disease to the abdomen/pelvis. 2. There is a small amount of gas either along the cervical canal or the adjacent fornix. A well-defined mass is not seen. 3. Chronic bilateral pars defects at L5 with 7 mm grade 1 anterolisthesis of L5 on S1 and resulting mild left foraminal stenosis. 4. Suspected right foraminal disc protrusion at L3-4 possibly causing mild right foraminal impingement.     05/21/2020 Cancer Staging   Staging form: Cervix Uteri, AJCC Version 9 - Clinical stage from 05/21/2020: FIGO Stage IIB (cT2b, cM0) - Signed by Heath Lark, MD on 05/21/2020    05/26/2020 PET scan   1. Intensely hypermetabolic circumferential metabolic activity in the uterine  cervix consistent with cervical carcinoma. 2. No evidence of metastatic disease in the pelvis. No metastatic lymphadenopathy. 3. No evidence of distant metastatic disease. 4. Focus of metabolic activity in the gastric antrum would be unlikely location for a cervical carcinoma metastasis. Favor gastritis or potentially primary gastric neoplasm. Consider upper endoscopy for further evaluation. 5. Diffuse activity in the thyroid gland is favored thyroiditis.   06/03/2020 Procedure   Status post right IJ port catheter.   06/07/2020 - 07/07/2020 Chemotherapy   The patient had weekly cisplatin for chemotherapy treatment.     06/07/2020 - 08/23/2020 Radiation Therapy    Radiation Treatment Dates: 06/07/2020 through 07/19/2020 Site Technique Total Dose (Gy) Dose per Fx (Gy) Completed Fx Beam Energies  Cervix: Cervix IMRT 45/45 1.8 25/25 6X  Cervix: Cervix_Bst 3D 9/9 1.8 5/5 15X    HDR brachytherapy: 07/26/2020, 08/06/2020, 08/09/2020, 08/17/2020, 08/23/2020     11/22/2020 PET scan   Marked interval response to therapy with no visible mass at the site of previous ill-defined thickening of the cervix, and marked interval decrease in FDG uptake associated with this area as outlined above.   No signs of new disease in the neck, chest, abdomen or of the pelvis   Markedly hypermetabolic thyroid gland likely related to thyroiditis is minimally changed, correlate with thyroid function. Given asymmetric enlargement of the RIGHT thyroid lobe would also suggest thyroid ultrasound for further evaluation.     12/15/2020 Procedure   Removal of implanted Port-A-Cath utilizing sharp and blunt dissection. The procedure was uncomplicated.       Interval History: The patient reports doing well since her  last visit with me.  She just returned from a trip to the beach.  She now has a camper that is on a rented lot there and and she plans to travel there about every 2 and half weeks.  She is working when she is back  in town.  Port was removed on 1/5. Last saw Dr. Sondra Come on 3/17. She was doing well at that time, was NED. 2-3 mm area of necrosis was noted along the anterior lip of the cervix.   She has been using her vaginal dilator approximately once a week.  She notes a pink tinge after using it.  Otherwise she denies any vaginal bleeding or discharge.  She reports normal bowel function and denies any urinary symptoms.  Patient is having some right low back pain which she thinks is related to sciatica.  She slipped on a new mattress in her camper recently and did not have any pain.  The pain began again when she returned home.  Past Medical/Surgical History: Past Medical History:  Diagnosis Date   Anxiety    Cervical cancer ([place])    History of radiation therapy 06/07/2020-07/19/2020   Cervix IMRT ; Dr. Gery Pray   Obesity (BMI 30-39.9)    Palpitations    Thyroid disease     Past Surgical History:  Procedure Laterality Date   IR IMAGING GUIDED PORT INSERTION  06/03/2020   IR REMOVAL TUN ACCESS W/ PORT W/O FL MOD SED  12/15/2020   OPERATIVE ULTRASOUND N/A 07/26/2020   Procedure: OPERATIVE ULTRASOUND;  Surgeon: Gery Pray, MD;  Location: [facility];  Service: Urology;  Laterality: N/A;   OPERATIVE ULTRASOUND N/A 08/06/2020   Procedure: OPERATIVE ULTRASOUND;  Surgeon: Gery Pray, MD;  Location[facility];  Service: Urology;  Laterality: N/A;   OPERATIVE ULTRASOUND N/A 08/09/2020   Procedure: OPERATIVE ULTRASOUND;  Surgeon: Gery Pray, MD;  Location: [facility];  Service: Urology;  Laterality: N/A;   OPERATIVE ULTRASOUND N/A 08/17/2020   Procedure: OPERATIVE ULTRASOUND;  Surgeon: Gery Pray, MD;  Location: [facility];  Service: Urology;  Laterality: N/A;   OPERATIVE ULTRASOUND N/A 08/23/2020   Procedure: OPERATIVE ULTRASOUND;  Surgeon: Gery Pray, MD;  Location: [facility];  Service: Urology;  Laterality: N/A;    TANDEM RING INSERTION  08/06/2020   TANDEM RING INSERTION N/A 07/26/2020   Procedure: TANDEM RING INSERTION FOR HIGH DOSE RADIATION THERAPY;  Surgeon: Gery Pray, MD;  Location: [facility];  Service: Urology;  Laterality: N/A;   TANDEM RING INSERTION N/A 08/06/2020   Procedure: TANDEM RING INSERTION;  Surgeon: Gery Pray, MD;  Location: [facility];  Service: Urology;  Laterality: N/A;   TANDEM RING INSERTION N/A 08/09/2020   Procedure: TANDEM RING INSERTION;  Surgeon: Gery Pray, MD;  Location: [facility];  Service: Urology;  Laterality: N/A;   TANDEM RING INSERTION N/A 08/17/2020   Procedure: TANDEM RING INSERTION;  Surgeon: Gery Pray, MD;  Location: [facility];  Service: Urology;  Laterality: N/A;   TANDEM RING INSERTION N/A 08/23/2020   Procedure: TANDEM RING INSERTION;  Surgeon: Gery Pray, MD;  Location: [facility];  Service: Urology;  Laterality: N/A;   THERAPEUTIC ABORTION      Family History  Problem Relation Age of Onset   Stroke Maternal Grandmother    Colon cancer Neg Hx    Breast cancer Neg Hx    Endometrial cancer Neg Hx  Ovarian cancer Neg Hx     Social History   Socioeconomic History   Marital status: Married    Spouse name: Dominica Severin   Number of children: 0   Years of education: Not on file   Highest education level: Not on file  Occupational History   Occupation: accountant  Tobacco Use   Smoking status: Former    Pack years: 0.00   Smokeless tobacco: Never   Tobacco comments:    quit 2019  Vaping Use   Vaping Use: Never used  Substance and Sexual Activity   Alcohol use: Not Currently   Drug use: Never   Sexual activity: Not on file  Other Topics Concern   Not on file  Social History Narrative   Not on file   Social Determinants of Health   Financial Resource Strain: Not on file  Food Insecurity: Not on file  Transportation Needs: Not on file  Physical  Activity: Not on file  Stress: Not on file  Social Connections: Not on file    Current Medications:  Current Outpatient Medications:    acetaminophen (TYLENOL) 325 MG tablet, Take 650 mg by mouth every 6 (six) hours as needed for moderate pain. , Disp: , Rfl:    diphenhydrAMINE (BENADRYL) 25 MG tablet, Take 25 mg by mouth every 6 (six) hours as needed for itching or allergies., Disp: , Rfl:    diphenhydramine-acetaminophen (TYLENOL PM) 25-500 MG TABS tablet, Take 1 tablet by mouth at bedtime as needed (sleep). , Disp: , Rfl:    escitalopram (LEXAPRO) 5 MG tablet, Take 5 mg by mouth daily., Disp: , Rfl:    levothyroxine (SYNTHROID) 88 MCG tablet, Take 88 mcg by mouth daily., Disp: , Rfl:   Review of Systems: Pertinent positives include hot flashes, back pain, easy bruising/bleeding, anxiety. Denies appetite changes, fevers, chills, fatigue, unexplained weight changes. Denies hearing loss, neck lumps or masses, mouth sores, ringing in ears or voice changes. Denies cough or wheezing.  Denies shortness of breath. Denies chest pain or palpitations. Denies leg swelling. Denies abdominal distention, pain, blood in stools, constipation, diarrhea, nausea, vomiting, or early satiety. Denies pain with intercourse, dysuria, frequency, hematuria or incontinence. Denies pelvic pain, vaginal bleeding or vaginal discharge.   Denies joint pain or muscle pain/cramps. Denies itching, rash, or wounds. Denies dizziness, headaches, numbness or seizures. Denies swollen lymph nodes or glands. Denies depression, confusion, or decreased concentration.  Physical Exam: BP (!) 147/91 (BP Location: Left Arm, Patient Position: Sitting)   Pulse 65   Temp (!) 97.5 F (36.4 C) (Tympanic)   Resp 16   Ht 5\' 7"  (1.702 m)   Wt 228 lb (103.4 kg)   SpO2 100%   BMI 35.71 kg/m  General: Alert, oriented, no acute distress. HEENT: Normocephalic, atraumatic, sclera anicteric. Chest: Unlabored breathing on room  air. Abdomen: Obese, soft, nontender.  Normoactive bowel sounds.  No masses or hepatosplenomegaly appreciated.   Extremities: Grossly normal range of motion.  Warm, well perfused.  No edema bilaterally. Skin: No rashes or lesions noted. Lymphatics: No cervical, supraclavicular, or inguinal adenopathy. GU: Normal appearing external genitalia without erythema, excoriation, or lesions.  Speculum exam reveals mildly atrophic vaginal mucosa, cervix somewhat atrophic with radiation changes apparent.  There is a 3-4 mm area centrally that appears necrotic and likely due to radiation.  Bimanual exam reveals no nodularity or firmness.  Rectovaginal exam confirms these findings.  Cervical biopsy: Preoperative diagnosis: Necrotic appearing tissue after definitive chemoradiation for cervix cancer Postoperative diagnosis: Same  as above Physician: Berline Lopes MD Specimens: Cervical biopsy Estimated blood loss: Minimal Procedure: After the procedure was discussed with the patient and she gave verbal consent, speculum was placed in the vagina and the cervix was well visualized.  Betadine was used x3 to cleanse the cervix.  Tischler forceps were then used to take 2 biopsies of the central necrotic appearing tissue.  No significant bleeding was noted after biopsy.  Biopsy was placed in formalin to be sent to pathology.  Overall the patient tolerated the procedure well.  Laboratory & Radiologic Studies: None new  Assessment & Plan: Kelly Hanson is a 60 y.o. woman with Stage IIB squamous cell carcinoma of the cervix who is status post definitive chemoradiation who appears to be NED on exam today.  Biopsy taken today from the central portion of the cervix that I suspect is due to radiation necrosis.  I will call the patient with the biopsy results.  If biopsy does not show recurrent cancer, then we will continue with visits every 3 months.  Should her next visit is scheduled with Dr. Sondra Come and she will come back and see  me in December.    We discussed signs and symptoms that would be concerning for disease recurrence.  The patient knows to call if she has any of these to be seen in clinic.  She will alternate visits between my clinic and radiation oncology every 3 months for the first couple of years after treatment.  Plan will be, per NCCN and SGO guidelines, to perform a Pap test yearly.  Imaging will only be performed for concerning symptoms or exam findings that raise suspicion for recurrence.  32 minutes of total time was spent for this patient encounter, including preparation, face-to-face counseling with the patient and coordination of care, and documentation of the encounter.  Jeral Pinch, MD  Division of Gynecologic Oncology  Department of Obstetrics and Gynecology  [facility]

## 2021-05-24 ENCOUNTER — Inpatient Hospital Stay: Payer: BC Managed Care – PPO | Attending: Gynecologic Oncology | Admitting: Gynecologic Oncology

## 2021-05-24 ENCOUNTER — Other Ambulatory Visit: Payer: Self-pay

## 2021-05-24 ENCOUNTER — Encounter: Payer: Self-pay | Admitting: Gynecologic Oncology

## 2021-05-24 VITALS — BP 147/91 | HR 65 | Temp 97.5°F | Resp 16 | Ht 67.0 in | Wt 228.0 lb

## 2021-05-24 DIAGNOSIS — E669 Obesity, unspecified: Secondary | ICD-10-CM | POA: Insufficient documentation

## 2021-05-24 DIAGNOSIS — F419 Anxiety disorder, unspecified: Secondary | ICD-10-CM | POA: Diagnosis not present

## 2021-05-24 DIAGNOSIS — C539 Malignant neoplasm of cervix uteri, unspecified: Secondary | ICD-10-CM | POA: Insufficient documentation

## 2021-05-24 DIAGNOSIS — Z923 Personal history of irradiation: Secondary | ICD-10-CM | POA: Insufficient documentation

## 2021-05-24 DIAGNOSIS — E079 Disorder of thyroid, unspecified: Secondary | ICD-10-CM | POA: Insufficient documentation

## 2021-05-24 DIAGNOSIS — Z79899 Other long term (current) drug therapy: Secondary | ICD-10-CM | POA: Insufficient documentation

## 2021-05-24 DIAGNOSIS — M545 Low back pain, unspecified: Secondary | ICD-10-CM | POA: Insufficient documentation

## 2021-05-24 DIAGNOSIS — Z9221 Personal history of antineoplastic chemotherapy: Secondary | ICD-10-CM | POA: Insufficient documentation

## 2021-05-24 DIAGNOSIS — N72 Inflammatory disease of cervix uteri: Secondary | ICD-10-CM | POA: Diagnosis not present

## 2021-05-24 NOTE — Patient Instructions (Addendum)
It was good to see you today!    I will release the biopsy results to you in my chart or give you a phone call if we need to discuss.  I suspect that what I biopsied today is related to tissue changes after your radiation treatment.  We will plan to do a Pap test at your next visit, which will be approximately 1 year after your completion of treatment.  If you develop any new symptoms, such as vaginal bleeding or discharge before your next visit, please call to come see me sooner.  Your next visit will be with Dr. Sondra Come in September and you will be back to see me in December.

## 2021-05-25 LAB — SURGICAL PATHOLOGY

## 2021-05-26 ENCOUNTER — Telehealth: Payer: Self-pay

## 2021-05-26 NOTE — Telephone Encounter (Signed)
Spoke with Kelly Hanson about her biopsy results, it showed dead tissue likely related to her radiation treatment per Dr. Charisse March note. Patient verbalized understanding and had no questions.

## 2021-06-10 ENCOUNTER — Encounter: Payer: Self-pay | Admitting: Hematology and Oncology

## 2021-06-23 DIAGNOSIS — Z1331 Encounter for screening for depression: Secondary | ICD-10-CM | POA: Diagnosis not present

## 2021-06-23 DIAGNOSIS — E063 Autoimmune thyroiditis: Secondary | ICD-10-CM | POA: Diagnosis not present

## 2021-06-23 DIAGNOSIS — F4322 Adjustment disorder with anxiety: Secondary | ICD-10-CM | POA: Diagnosis not present

## 2021-06-23 DIAGNOSIS — C539 Malignant neoplasm of cervix uteri, unspecified: Secondary | ICD-10-CM | POA: Diagnosis not present

## 2021-08-30 ENCOUNTER — Encounter: Payer: Self-pay | Admitting: Radiology

## 2021-08-30 IMAGING — PT NM PET TUM IMG RESTAG (PS) SKULL BASE T - THIGH
7 series · 25 of 25 positions shown · non-contrast
Comparison: PET imaging from May 26, 2020

CLINICAL DATA: Subsequent treatment strategy for uterine/cervical
cancer (squamous cell carcinoma of the cervix post radiotherapy)
11.51.

EXAM:
NUCLEAR MEDICINE PET SKULL BASE TO THIGH
TECHNIQUE: 11.51 mCi F-18 FDG was injected intravenously. Full-ring PET imaging
was performed from the skull base to thigh after the radiotracer. CT
data was obtained and used for attenuation correction and anatomic
localization.
Fasting blood glucose: 98 mg/dl

[Series 3: pet sk_thigh ac · axial · 5.0mm · 4.07mm/px · z∈[-921,-5]mm · 6 of 230 slices shown]
[im 1/230]
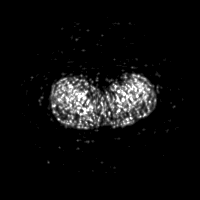
[im 46/230]
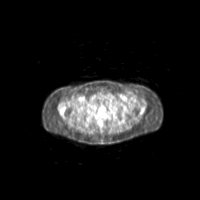
[im 92/230]
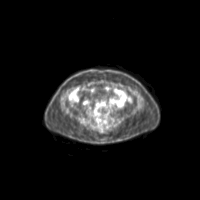
[im 138/230]
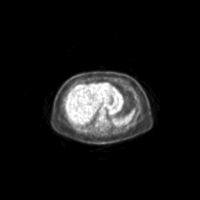
[im 184/230]
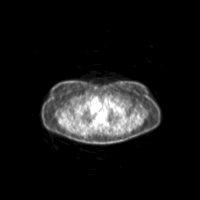
[im 230/230]
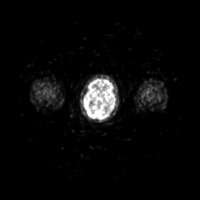

[Series 4: ct sk_thigh 5.0 bf37 · axial · 5.0mm · 0.98mm/px · z∈[-921,-5]mm · 6 of 230 slices shown]
[im 1/230]
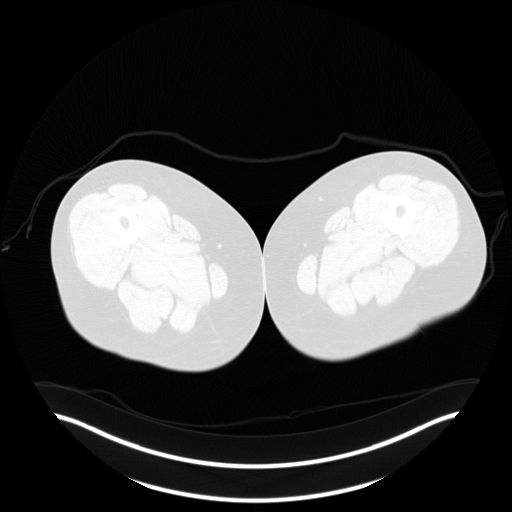
[im 46/230]
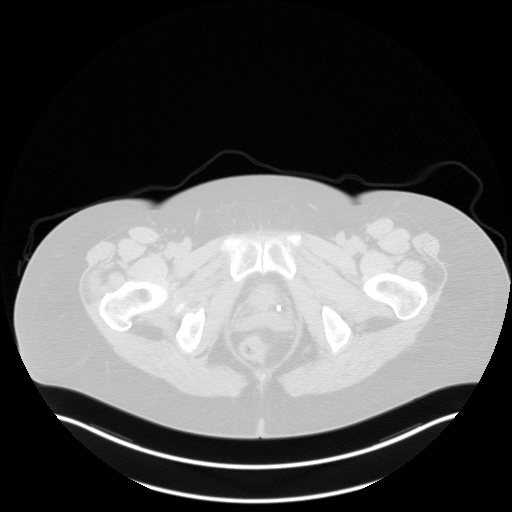
[im 92/230]
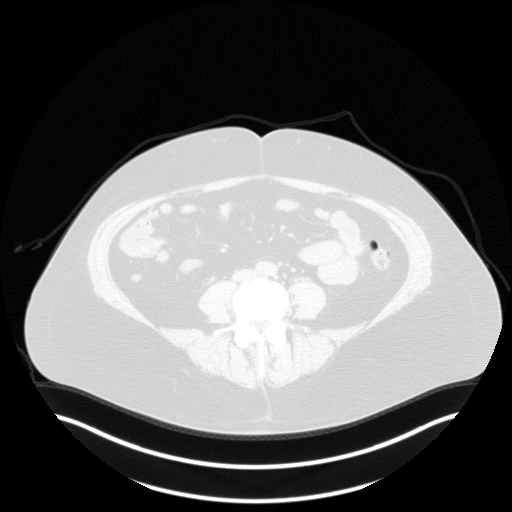
[im 138/230]
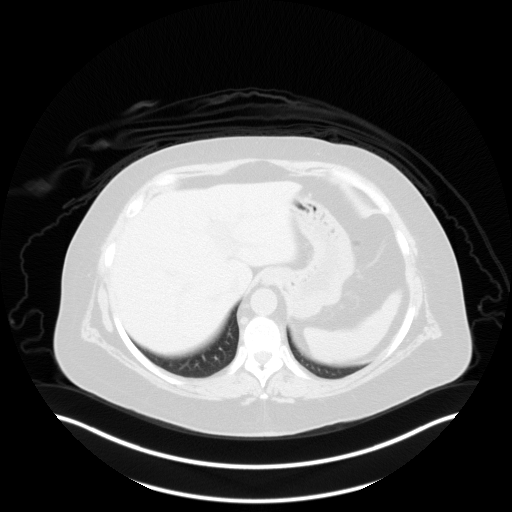
[im 184/230]
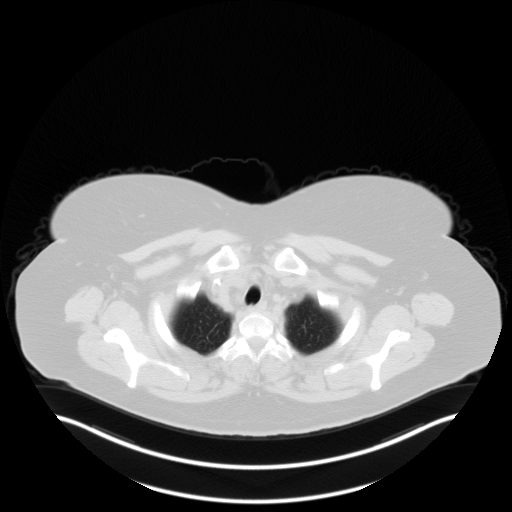
[im 230/230  brain]
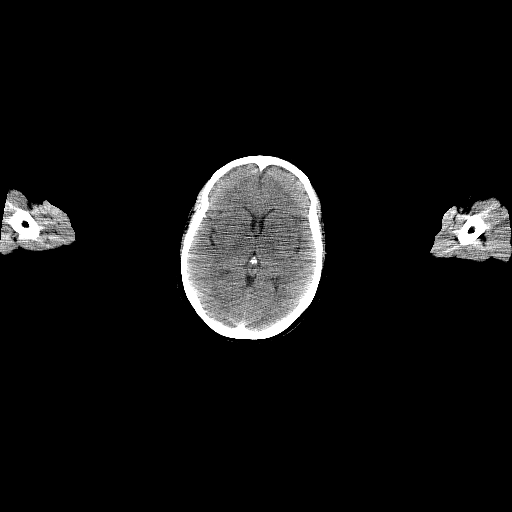

[Series 5: pet sk_thigh nac · axial · 5.0mm · 4.07mm/px · z∈[-921,-5]mm · 5 of 230 slices shown]
[im 1/230]
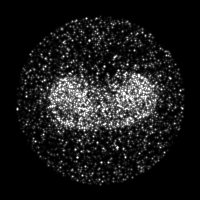
[im 58/230]
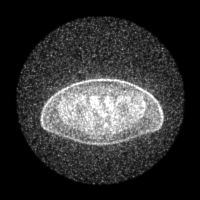
[im 115/230]
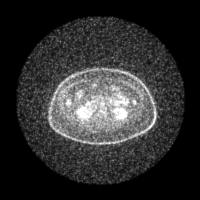
[im 172/230]
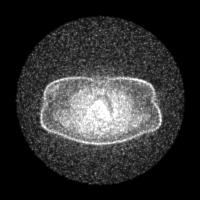
[im 230/230]
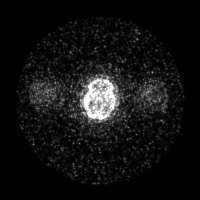

[Series 8: ct sk_thigh 5.0 br59 lung_bone · axial · 5.0mm · 0.64mm/px · 1 of 59 slices shown]
[im 1/59]
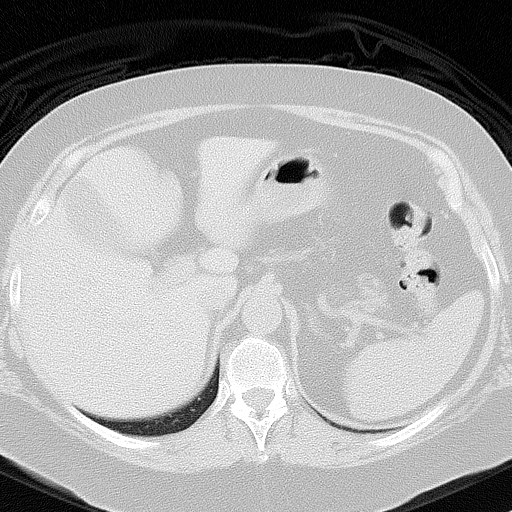

[Series 603: <mip collection> · coronal · 1.90mm/px · 1 of 32 slices shown]
[im 1/32]
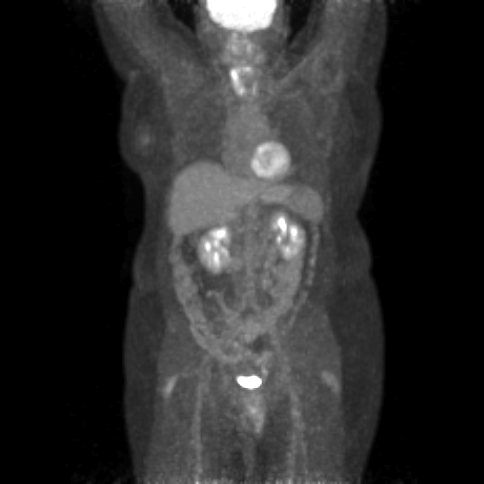

[Series 604: fused cor · 1 of 54 slices shown]
[im 1/54]
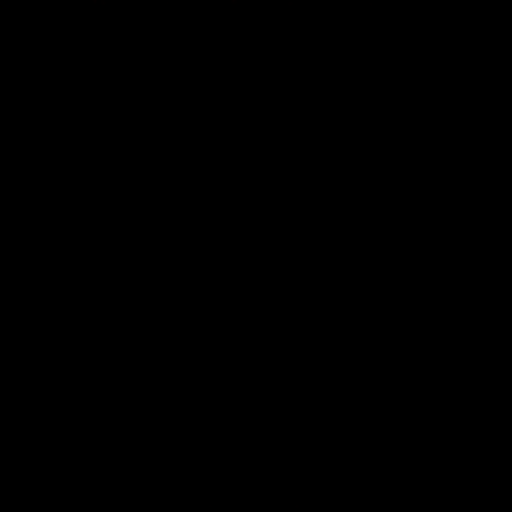

[Series 605: range-ct sk_thigh 5.0 bf37-tra-<alpha range> · 5 of 222 slices shown]
[im 1/222]
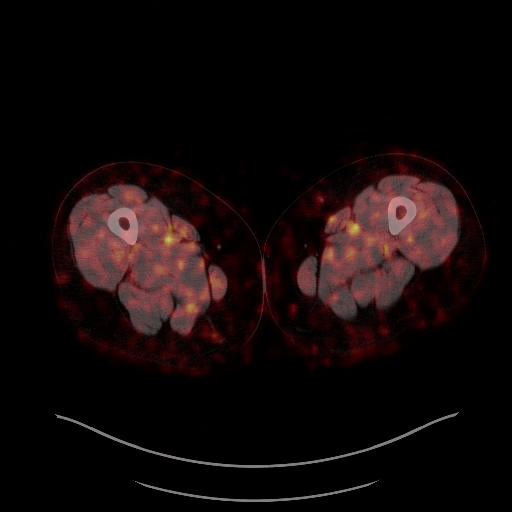
[im 56/222]
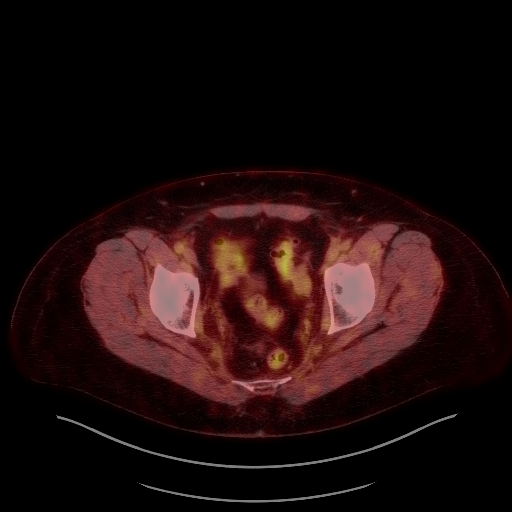
[im 111/222]
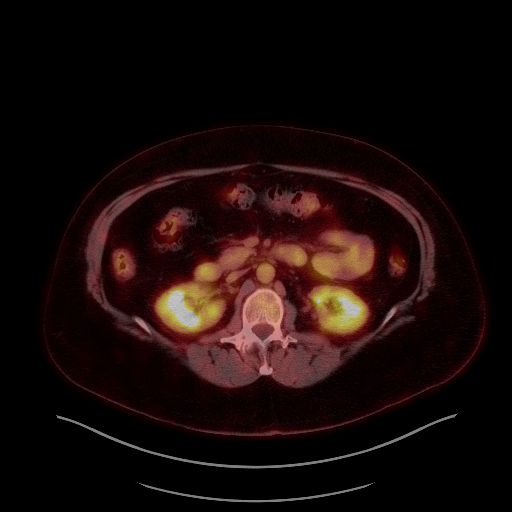
[im 166/222]
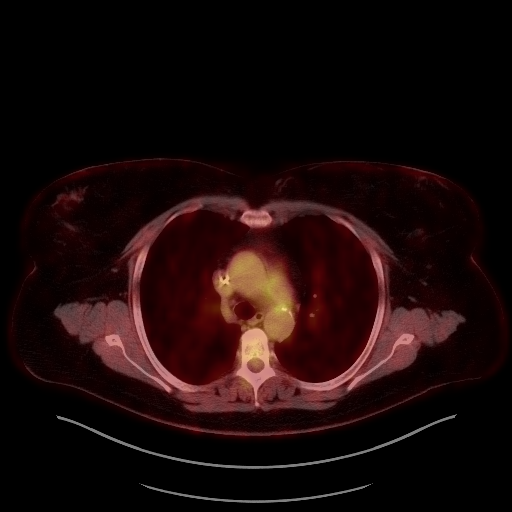
[im 222/222]
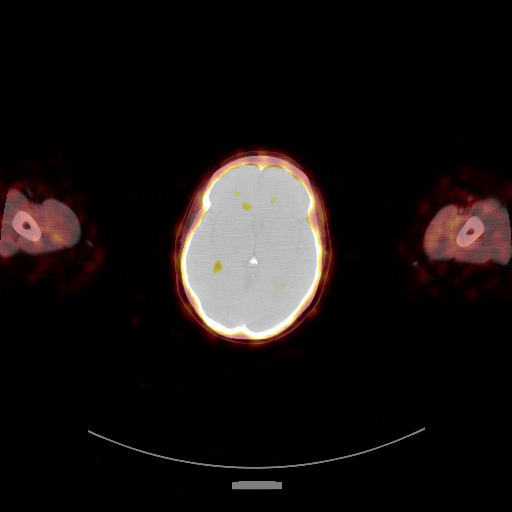

[25 of 25 positions shown; findings below may reference images not displayed]

FINDINGS: Mediastinal blood pool activity: SUV max

Liver activity: SUV max NA

NECK: No hypermetabolic lymph nodes in the neck.

Marked increased metabolism about the RIGHT thyroid with asymmetric
thyroid enlargement on the RIGHT as compared to the LEFT. Maximum
SUV of 17.1 as compared to 16.6 on the prior exam. The contour of
the thyroid gland is unchanged compared to the recent PET evaluation

Incidental CT findings: none

CHEST: No hypermetabolic mediastinal or hilar nodes. No suspicious
pulmonary nodules on the CT scan.

Incidental CT findings: RIGHT Port-A-Cath terminates in the upper
RIGHT atrium. Scattered calcified atheromatous plaque in the
thoracic aorta. No pericardial effusion. Normal heart size. Normal
caliber central pulmonary vasculature.

ABDOMEN/PELVIS: No abnormal hypermetabolic activity within the
liver, pancreas, adrenal glands, or spleen. No hypermetabolic lymph
nodes in the abdomen or pelvis. Area of marked hypermetabolic uptake
in the area of the cervix is not seen on the current study. Maximum
SUV back in this location in the range of 4, somewhat confounded by
adjacent urinary bladder as compared to 21.8 on the previous exam no
sign of pelvic adenopathy or new mass

Incidental CT findings: Liver with smooth contour. No
pericholecystic stranding. Spleen, pancreas, adrenal glands and
kidneys are unremarkable on noncontrast imaging. No acute small
bowel process. Normal appendix. Stomach under distended limiting
assessment. Aorta of normal caliber. Trace stranding in fascial
planes of the pelvis following radiotherapy.

SKELETON: No focal hypermetabolic activity to suggest skeletal
metastasis.

Incidental CT findings: Spinal degenerative changes.
IMPRESSION: Marked interval response to therapy with no visible mass at the site
of previous ill-defined thickening of the cervix, and marked
interval decrease in FDG uptake associated with this area as
outlined above.

No signs of new disease in the neck, chest, abdomen or of the pelvis

Markedly hypermetabolic thyroid gland likely related to thyroiditis
is minimally changed, correlate with thyroid function. Given
asymmetric enlargement of the RIGHT thyroid lobe would also suggest
thyroid ultrasound for further evaluation.

These results will be called to the ordering clinician or
representative by the Radiologist Assistant, and communication
documented in the PACS or [REDACTED].

## 2021-08-31 NOTE — Progress Notes (Signed)
Radiation Oncology         [phone] ________________________________  Name: Kelly Hanson MRN: [id number]  Date: 09/01/2021  DOB: [birth year]  Follow-Up Visit Note  CC: [name], MD  [name], MD    ICD-10-CM   1. Malignant neoplasm of endocervix [facility])  C53.0       Diagnosis:  Clinical stage IIB squamous cell carcinoma of the cervix  Interval Since Last Radiation: 1 year, 1 month, and 13 days   Radiation Treatment Dates: 06/07/2020 through 07/19/2020 Site Technique Total Dose (Gy) Dose per Fx (Gy) Completed Fx Beam Energies  Cervix: Cervix IMRT 45/45 1.8 25/25 6X  Cervix: Cervix_Bst 3D 9/9 1.8 5/5 15X   HDR brachytherapy: 07/26/2020, 08/06/2020, 08/09/2020, 08/17/2020, 08/23/2020  Narrative:  The patient returns today for routine follow-up, she was last seen here for follow-up on 02/24/21. Since her last visit, the patient followed up with Dr. Berline Lopes on 05/24/21. During which time, the patient reported using her vaginal dilator approximately once a week. Patient noted a pink tinge following use of vaginal dilator; she otherwise denied any vaginal bleeding or discharge and reported normal bowel function. She also reported some right-lower back pain at the time which she attributed to sciatica. Patient also noted to report slipping on a new mattress in her camper recently; though did not have any pain immediately after (back pain began again when she returned home).   Dr. Berline Lopes additionally collected a cervical biopsy during follow-up visit on 05/24/21. Biopsy was prompted from speculum exam performed during this visit which revealed mildly atrophic vaginal mucosa and a cervix which appeared somewhat atrophic with radiation changes apparent. A 3-4 mm area centrally, which appeared necrotic and likely due to radiation was noted. Pathology from biopsy revealed necroinflammatory debris (ulcer).      Patient reports her diarrhea has returned to her baseline.  She denies any  rectal bleeding or hematuria.  She denies any  vaginal discharge or bleeding.  She denies any low back or pelvic pain.  The patient otherwise has not received any additional imaging since she was last seen.                     Allergies:  has No Known Allergies.  Meds: Current Outpatient Medications  Medication Sig Dispense Refill   acetaminophen (TYLENOL) 325 MG tablet Take 650 mg by mouth every 6 (six) hours as needed for moderate pain.      ALPRAZolam (XANAX) 0.5 MG tablet Take 0.5 mg by mouth at bedtime as needed for anxiety.     diphenhydrAMINE (BENADRYL) 25 MG tablet Take 25 mg by mouth every 6 (six) hours as needed for itching or allergies.     diphenhydramine-acetaminophen (TYLENOL PM) 25-500 MG TABS tablet Take 1 tablet by mouth at bedtime as needed (sleep).      escitalopram (LEXAPRO) 5 MG tablet Take 5 mg by mouth daily.     levothyroxine (SYNTHROID) 88 MCG tablet Take 88 mcg by mouth daily.     No current facility-administered medications for this encounter.    Physical Findings: The patient is in no acute distress. Patient is alert and oriented.  height is 5\' 7"  (1.702 m) and weight is 234 lb 3.2 oz (106.2 kg). Her temperature is 97.9 F (36.6 C). Her blood pressure is 150/97 (abnormal) and her pulse is 86. Her respiration is 20 and oxygen saturation is 99%.  Lungs are clear to auscultation bilaterally. Heart has regular rate and rhythm. No palpable  loss, supraclavicular, or axillary adenopathy. Abdomen soft, non-tender, normal bowel sounds.   On pelvic examination the external genitalia were unremarkable. A speculum exam was performed. There are no mucosal lesions noted in the vaginal vault..Slight area of necrosis along the right cervical os.  The proximal vaginal mucosa bleeds slightly with manipulation.  On bimanual and rectovaginal examination there were no pelvic masses appreciated.    Lab Findings: Lab Results  Component Value Date   WBC 4.1 12/15/2020   HGB 13.5  12/15/2020   HCT 40.5 12/15/2020   MCV 96.4 12/15/2020   PLT 197 12/15/2020    Radiographic Findings: No results found.  Impression:  Clinical stage IIB squamous cell carcinoma of the cervix  No evidence of recurrence on clinical exam.  Pap smear not performed today since the patient had a cervical biopsy approximately 3 months ago.  Dr. Berline Lopes is planning on Pap smear with the patient's follow-up in December.    Plan: Routine follow-up in 6 months.  Encouraged the patient to use her vaginal dilator on a more regular basis.   20 minutes of total time was spent for this patient encounter, including preparation, face-to-face counseling with the patient and coordination of care, physical exam, and documentation of the encounter. ____________________________________  Blair Promise, PhD, MD   This document serves as a record of services personally performed by Gery Pray, MD. It was created on his behalf by Roney Mans, a trained medical scribe. The creation of this record is based on the scribe's personal observations and the provider's statements to them. This document has been checked and approved by the attending provider.

## 2021-09-01 ENCOUNTER — Encounter: Payer: Self-pay | Admitting: Radiation Oncology

## 2021-09-01 ENCOUNTER — Telehealth: Payer: Self-pay | Admitting: Radiation Oncology

## 2021-09-01 ENCOUNTER — Other Ambulatory Visit: Payer: Self-pay

## 2021-09-01 ENCOUNTER — Ambulatory Visit
Admission: RE | Admit: 2021-09-01 | Discharge: 2021-09-01 | Disposition: A | Discharge status: Home / Self Care | Payer: BC Managed Care – PPO | Source: Ambulatory Visit | Attending: Radiation Oncology | Admitting: Radiation Oncology

## 2021-09-01 VITALS — BP 150/97 | HR 86 | Temp 97.9°F | Resp 20 | Ht 67.0 in | Wt 234.2 lb

## 2021-09-01 DIAGNOSIS — Z923 Personal history of irradiation: Secondary | ICD-10-CM | POA: Diagnosis not present

## 2021-09-01 DIAGNOSIS — M549 Dorsalgia, unspecified: Secondary | ICD-10-CM | POA: Insufficient documentation

## 2021-09-01 DIAGNOSIS — Z8541 Personal history of malignant neoplasm of cervix uteri: Secondary | ICD-10-CM | POA: Insufficient documentation

## 2021-09-01 DIAGNOSIS — Z79899 Other long term (current) drug therapy: Secondary | ICD-10-CM | POA: Diagnosis not present

## 2021-09-01 DIAGNOSIS — Z8542 Personal history of malignant neoplasm of other parts of uterus: Secondary | ICD-10-CM | POA: Diagnosis not present

## 2021-09-01 DIAGNOSIS — Z08 Encounter for follow-up examination after completed treatment for malignant neoplasm: Secondary | ICD-10-CM | POA: Diagnosis not present

## 2021-09-01 DIAGNOSIS — C530 Malignant neoplasm of endocervix: Secondary | ICD-10-CM

## 2021-09-01 NOTE — Telephone Encounter (Signed)
Informed patient of her 6 month follow up scheduled with Dr. Sondra Come on 03/02/22 @ 9 am. Patient voiced understanding.

## 2021-09-01 NOTE — Progress Notes (Addendum)
Kelly Hanson is here today for follow up post radiation to the cervix.  They completed their radiation on: 08/23/2020   Does the patient complain of any of the following:  Pain:denies Abdominal bloating: denies Diarrhea/Constipation: occasional diarrhea Nausea/Vomiting: denies Vaginal Discharge: denies Blood in Urine or Stool: denies Urinary Issues (dysuria/incomplete emptying/ incontinence/ increased frequency/urgency): intermittent frequency, nocturia x1-2, occasional stress incontinence Does patient report using vaginal dilator 2-3 times a week and/or sexually active 2-3 weeks: "once every couple of weeks" Post radiation skin changes: denies   Additional comments if applicable: none  Vitals:   09/01/21 0854  BP: (!) 150/97  Pulse: 86  Resp: 20  Temp: 97.9 F (36.6 C)  SpO2: 99%  Weight: 234 lb 3.2 oz (106.2 kg)  Height: 5\' 7"  (1.702 m)

## 2021-10-25 DIAGNOSIS — Z01419 Encounter for gynecological examination (general) (routine) without abnormal findings: Secondary | ICD-10-CM | POA: Diagnosis not present

## 2021-10-25 DIAGNOSIS — Z2821 Immunization not carried out because of patient refusal: Secondary | ICD-10-CM | POA: Diagnosis not present

## 2021-10-25 DIAGNOSIS — Z1211 Encounter for screening for malignant neoplasm of colon: Secondary | ICD-10-CM | POA: Diagnosis not present

## 2021-10-25 DIAGNOSIS — Z6837 Body mass index (BMI) 37.0-37.9, adult: Secondary | ICD-10-CM | POA: Diagnosis not present

## 2021-11-15 ENCOUNTER — Telehealth: Payer: Self-pay | Admitting: *Deleted

## 2021-11-15 NOTE — Telephone Encounter (Signed)
Spoke with the patient and reschedule her appt from 12/16 to 12/23

## 2021-11-25 ENCOUNTER — Inpatient Hospital Stay: Payer: BC Managed Care – PPO | Admitting: Gynecologic Oncology

## 2021-12-01 ENCOUNTER — Encounter: Payer: Self-pay | Admitting: Gynecologic Oncology

## 2021-12-02 ENCOUNTER — Telehealth: Payer: Self-pay | Admitting: *Deleted

## 2021-12-02 ENCOUNTER — Inpatient Hospital Stay: Payer: BC Managed Care – PPO | Admitting: Gynecologic Oncology

## 2021-12-02 NOTE — Telephone Encounter (Signed)
Patient called and rescheduled her appt from today to 1/6

## 2021-12-08 DIAGNOSIS — Z1231 Encounter for screening mammogram for malignant neoplasm of breast: Secondary | ICD-10-CM | POA: Diagnosis not present

## 2021-12-14 ENCOUNTER — Encounter: Payer: Self-pay | Admitting: Gynecologic Oncology

## 2021-12-16 ENCOUNTER — Other Ambulatory Visit (HOSPITAL_COMMUNITY)
Admission: RE | Admit: 2021-12-16 | Discharge: 2021-12-16 | Disposition: A | Discharge status: Home / Self Care | Payer: BC Managed Care – PPO | Source: Ambulatory Visit | Attending: Gynecologic Oncology | Admitting: Gynecologic Oncology

## 2021-12-16 ENCOUNTER — Inpatient Hospital Stay: Payer: BC Managed Care – PPO | Attending: Gynecologic Oncology | Admitting: Gynecologic Oncology

## 2021-12-16 ENCOUNTER — Other Ambulatory Visit: Payer: Self-pay

## 2021-12-16 ENCOUNTER — Inpatient Hospital Stay: Payer: BC Managed Care – PPO

## 2021-12-16 ENCOUNTER — Encounter: Payer: Self-pay | Admitting: Gynecologic Oncology

## 2021-12-16 VITALS — BP 160/108 | HR 85 | Temp 99.3°F | Resp 18 | Ht 67.0 in | Wt 234.8 lb

## 2021-12-16 DIAGNOSIS — R002 Palpitations: Secondary | ICD-10-CM | POA: Diagnosis not present

## 2021-12-16 DIAGNOSIS — Z6836 Body mass index (BMI) 36.0-36.9, adult: Secondary | ICD-10-CM | POA: Insufficient documentation

## 2021-12-16 DIAGNOSIS — Z9221 Personal history of antineoplastic chemotherapy: Secondary | ICD-10-CM | POA: Diagnosis not present

## 2021-12-16 DIAGNOSIS — F419 Anxiety disorder, unspecified: Secondary | ICD-10-CM | POA: Insufficient documentation

## 2021-12-16 DIAGNOSIS — R102 Pelvic and perineal pain: Secondary | ICD-10-CM | POA: Insufficient documentation

## 2021-12-16 DIAGNOSIS — Z79899 Other long term (current) drug therapy: Secondary | ICD-10-CM | POA: Diagnosis not present

## 2021-12-16 DIAGNOSIS — C539 Malignant neoplasm of cervix uteri, unspecified: Secondary | ICD-10-CM | POA: Insufficient documentation

## 2021-12-16 DIAGNOSIS — R350 Frequency of micturition: Secondary | ICD-10-CM

## 2021-12-16 DIAGNOSIS — Z923 Personal history of irradiation: Secondary | ICD-10-CM | POA: Insufficient documentation

## 2021-12-16 DIAGNOSIS — E669 Obesity, unspecified: Secondary | ICD-10-CM | POA: Insufficient documentation

## 2021-12-16 DIAGNOSIS — R030 Elevated blood-pressure reading, without diagnosis of hypertension: Secondary | ICD-10-CM | POA: Diagnosis not present

## 2021-12-16 DIAGNOSIS — Z8541 Personal history of malignant neoplasm of cervix uteri: Secondary | ICD-10-CM | POA: Insufficient documentation

## 2021-12-16 LAB — URINALYSIS, COMPLETE (UACMP) WITH MICROSCOPIC
Bacteria, UA: NONE SEEN
Bilirubin Urine: NEGATIVE
Glucose, UA: NEGATIVE mg/dL
Ketones, ur: NEGATIVE mg/dL
Leukocytes,Ua: NEGATIVE
Nitrite: NEGATIVE
Protein, ur: NEGATIVE mg/dL
Specific Gravity, Urine: 1.013 (ref 1.005–1.030)
pH: 5 (ref 5.0–8.0)

## 2021-12-16 NOTE — Progress Notes (Signed)
Gynecologic Oncology Return Clinic Visit  12/16/2021  Reason for Visit: Surveillance visit in the setting of cervix cancer  Treatment History: Oncology History  Cervical cancer ([place])  02/09/2020 Initial Diagnosis   Between 6-8 months ago, the patient endorses having irregular spotting intermittnetly every few weeks that would last for a few days. This did not change over time and she describes it as light spotting. This was the first bleeding she had after menopause. She ultimately saw her PCP who performed a pap in March which returned showing HSIL. The patient tells me today that this is the first pap test she has had ever. She was then referred to gyn and underwent EMB/LEEP and transvaginal ultrasound for work-up of her PMB and high grade cervical dysplasia. Unfortunately, her pathology revealed SCC of the cervix   05/03/2020 Pathology Results   Endocervical curettings show fragments of squamous epithelium with high-grade squamous intraepithelial lesion, CIN-3 as well as fragments of invasive squamous cell carcinoma within the endometrial biopsy.  The invasive squamous cell carcinoma have depth of invasion more than 3 mm, with at least 10 mm, thickness almost 1.2 mm.   05/18/2020 Imaging   1. No findings of metastatic disease to the abdomen/pelvis. 2. There is a small amount of gas either along the cervical canal or the adjacent fornix. A well-defined mass is not seen. 3. Chronic bilateral pars defects at L5 with 7 mm grade 1 anterolisthesis of L5 on S1 and resulting mild left foraminal stenosis. 4. Suspected right foraminal disc protrusion at L3-4 possibly causing mild right foraminal impingement.     05/21/2020 Cancer Staging   Staging form: Cervix Uteri, AJCC Version 9 - Clinical stage from 05/21/2020: FIGO Stage IIB (cT2b, cM0) - Signed by Heath Lark, MD on 05/21/2020    05/26/2020 PET scan   1. Intensely hypermetabolic circumferential metabolic activity in the uterine cervix consistent with  cervical carcinoma. 2. No evidence of metastatic disease in the pelvis. No metastatic lymphadenopathy. 3. No evidence of distant metastatic disease. 4. Focus of metabolic activity in the gastric antrum would be unlikely location for a cervical carcinoma metastasis. Favor gastritis or potentially primary gastric neoplasm. Consider upper endoscopy for further evaluation. 5. Diffuse activity in the thyroid gland is favored thyroiditis.   06/03/2020 Procedure   Status post right IJ port catheter.   06/07/2020 - 07/07/2020 Chemotherapy   The patient had weekly cisplatin for chemotherapy treatment.     06/07/2020 - 08/23/2020 Radiation Therapy    Radiation Treatment Dates: 06/07/2020 through 07/19/2020 Site Technique Total Dose (Gy) Dose per Fx (Gy) Completed Fx Beam Energies  Cervix: Cervix IMRT 45/45 1.8 25/25 6X  Cervix: Cervix_Bst 3D 9/9 1.8 5/5 15X    HDR brachytherapy: 07/26/2020, 08/06/2020, 08/09/2020, 08/17/2020, 08/23/2020     11/22/2020 PET scan   Marked interval response to therapy with no visible mass at the site of previous ill-defined thickening of the cervix, and marked interval decrease in FDG uptake associated with this area as outlined above.   No signs of new disease in the neck, chest, abdomen or of the pelvis   Markedly hypermetabolic thyroid gland likely related to thyroiditis is minimally changed, correlate with thyroid function. Given asymmetric enlargement of the RIGHT thyroid lobe would also suggest thyroid ultrasound for further evaluation.     12/15/2020 Procedure   Removal of implanted Port-A-Cath utilizing sharp and blunt dissection. The procedure was uncomplicated.       Interval History: Last saw Dr. Sondra Come on 9/22.  Patient reports  overall feeling well.  She denies any vaginal bleeding or discharge other than a small amount of pink spotting with dilator use.  She is using her dilator once a week.  Her diarrhea has significantly improved since finishing  radiation.  She now notes only occasional diarrhea.  She denies any abdominal or pelvic pain.  She has had several weeks of intermittent urinary frequency and what she describes as small amount of pressure (not pain).  Had a mammogram last week.  Is having some intermittent palpitations again.  These had stopped after she lost weight but have started happening again.  She is seeing her PCP soon.  Past Medical/Surgical History: Past Medical History:  Diagnosis Date   Anxiety    Cervical cancer [facility])    History of radiation therapy 06/07/2020-07/19/2020   Cervix IMRT ; Dr. Gery Pray   History of radiation therapy 08/23/2020   cervix HDR 07/26/2020-08/23/2020  Dr Gery Pray   Obesity (BMI 30-39.9)    Palpitations    Thyroid disease     Past Surgical History:  Procedure Laterality Date   IR IMAGING GUIDED PORT INSERTION  06/03/2020   IR REMOVAL TUN ACCESS W/ PORT W/O FL MOD SED  12/15/2020   OPERATIVE ULTRASOUND N/A 07/26/2020   Procedure: OPERATIVE ULTRASOUND;  Surgeon: Gery Pray, MD;  Location: [facility];  Service: Urology;  Laterality: N/A;   OPERATIVE ULTRASOUND N/A 08/06/2020   Procedure: OPERATIVE ULTRASOUND;  Surgeon: Gery Pray, MD;  Location: [facility];  Service: Urology;  Laterality: N/A;   OPERATIVE ULTRASOUND N/A 08/09/2020   Procedure: OPERATIVE ULTRASOUND;  Surgeon: Gery Pray, MD;  Location: [facility];  Service: Urology;  Laterality: N/A;   OPERATIVE ULTRASOUND N/A 08/17/2020   Procedure: OPERATIVE ULTRASOUND;  Surgeon: Gery Pray, MD;  Location: [facility];  Service: Urology;  Laterality: N/A;   OPERATIVE ULTRASOUND N/A 08/23/2020   Procedure: OPERATIVE ULTRASOUND;  Surgeon: Gery Pray, MD;  Location: [facility];  Service: Urology;  Laterality: N/A;   TANDEM RING INSERTION  08/06/2020   TANDEM RING INSERTION N/A 07/26/2020   Procedure: TANDEM RING INSERTION FOR HIGH DOSE  RADIATION THERAPY;  Surgeon: Gery Pray, MD;  Location: [facility];  Service: Urology;  Laterality: N/A;   TANDEM RING INSERTION N/A 08/06/2020   Procedure: TANDEM RING INSERTION;  Surgeon: Gery Pray, MD;  Location: [facility];  Service: Urology;  Laterality: N/A;   TANDEM RING INSERTION N/A 08/09/2020   Procedure: TANDEM RING INSERTION;  Surgeon: Gery Pray, MD;  Location: [facility];  Service: Urology;  Laterality: N/A;   TANDEM RING INSERTION N/A 08/17/2020   Procedure: TANDEM RING INSERTION;  Surgeon: Gery Pray, MD;  Location: [facility];  Service: Urology;  Laterality: N/A;   TANDEM RING INSERTION N/A 08/23/2020   Procedure: TANDEM RING INSERTION;  Surgeon: Gery Pray, MD;  Location: [facility];  Service: Urology;  Laterality: N/A;   THERAPEUTIC ABORTION      Family History  Problem Relation Age of Onset   Stroke Maternal Grandmother    Colon cancer Neg Hx    Breast cancer Neg Hx    Endometrial cancer Neg Hx    Ovarian cancer Neg Hx     Social History   Socioeconomic History   Marital status: Married    Spouse name: Dominica Severin   Number of children: 0   Years of education: Not on file   Highest  education level: Not on file  Occupational History   Occupation: accountant  Tobacco Use   Smoking status: Former   Smokeless tobacco: Never   Tobacco comments:    quit 2019  Vaping Use   Vaping Use: Never used  Substance and Sexual Activity   Alcohol use: Not Currently   Drug use: Never   Sexual activity: Not on file  Other Topics Concern   Not on file  Social History Narrative   Not on file   Social Determinants of Health   Financial Resource Strain: Not on file  Food Insecurity: Not on file  Transportation Needs: Not on file  Physical Activity: Not on file  Stress: Not on file  Social Connections: Not on file    Current Medications:  Current Outpatient Medications:     acetaminophen (TYLENOL) 325 MG tablet, Take 650 mg by mouth every 6 (six) hours as needed for moderate pain. , Disp: , Rfl:    ALPRAZolam (XANAX) 0.5 MG tablet, Take 0.5 mg by mouth at bedtime as needed for anxiety., Disp: , Rfl:    diphenhydramine-acetaminophen (TYLENOL PM) 25-500 MG TABS tablet, Take 1 tablet by mouth at bedtime as needed (sleep). , Disp: , Rfl:    escitalopram (LEXAPRO) 5 MG tablet, Take 5 mg by mouth daily., Disp: , Rfl:    levothyroxine (SYNTHROID) 88 MCG tablet, Take 88 mcg by mouth daily., Disp: , Rfl:    diphenhydrAMINE (BENADRYL) 25 MG tablet, Take 25 mg by mouth every 6 (six) hours as needed for itching or allergies. (Patient not taking: Reported on 12/14/2021), Disp: , Rfl:   Review of Systems: Pertinent positives include palpitations, urinary frequency, incontinence, hot flashes. Denies appetite changes, fevers, chills, fatigue, unexplained weight changes. Denies hearing loss, neck lumps or masses, mouth sores, ringing in ears or voice changes. Denies cough or wheezing.  Denies shortness of breath. Denies chest pain. Denies leg swelling. Denies abdominal distention, pain, blood in stools, constipation, diarrhea, nausea, vomiting, or early satiety. Denies pain with intercourse, dysuria, hematuria. Denies pelvic pain, vaginal bleeding or vaginal discharge.   Denies joint pain, back pain or muscle pain/cramps. Denies itching, rash, or wounds. Denies dizziness, headaches, numbness or seizures. Denies swollen lymph nodes or glands, denies easy bruising or bleeding. Denies anxiety, depression, confusion, or decreased concentration.  Physical Exam: BP (!) 160/108 (BP Location: Left Arm, Patient Position: Sitting) Comment: Denies headache, nausea, or dizziness. Patient states this is normal for her and her PCP is aware. Dr. Berline Lopes notified.   Pulse 85    Temp 99.3 F (37.4 C) (Tympanic)    Resp 18    Ht 5\' 7"  (1.702 m)    Wt 234 lb 12.8 oz (106.5 kg)    SpO2 100%    BMI  36.77 kg/m  General: Alert, oriented, no acute distress. HEENT: Normocephalic, atraumatic, sclera anicteric. Chest: Clear to auscultation bilaterally.  No wheezes or rhonchi. Cardiovascular: Regular rate and rhythm, no murmurs. Abdomen: Obese, soft, nontender.  Normoactive bowel sounds.  No masses or hepatosplenomegaly appreciated.   Extremities: Grossly normal range of motion.  Warm, well perfused.  No edema bilaterally. Skin: No rashes or lesions noted. Lymphatics: No cervical, supraclavicular, or inguinal adenopathy. GU: Normal appearing external genitalia without erythema, excoriation, or lesions.  Speculum exam reveals atrophic vaginal mucosa, radiation changes noted.  Cervix atrophic without lesions, Pap test and HPV testing collected.  Minimal bleeding caused by speculum manipulation.  Bimanual exam reveals cervix is smooth, no nodularity.  Rectovaginal exam  confirms these findings, no masses.  Laboratory & Radiologic Studies: 05/24/2021: Cervical biopsy showed necroinflammatory debris  Assessment & Plan: Leilyn Frayre is a 61 y.o. woman with Stage IIB squamous cell carcinoma of the cervix who is status post definitive chemoradiation who appears to be NED on exam today.  Patient is overall doing well and is NED on exam today.  Pap test performed today.  I will contact the patient with Pap results.  Given urinary symptoms, discussed sending urinalysis and urine culture.  If this is consistent with a urinary tract infection, will treat with antibiotics.  If no infection, then patient may benefit from treatment for presumed radiation cystitis.  Patient's blood pressure was quite elevated today.  She notes that this is normal for her and is quite anxious.  She has a blood pressure cuff at home and will check her blood pressure when she gets home and let me know if it is still as high as it is here.  She reports being completely asymptomatic.  We discussed signs and symptoms that would be  concerning for disease recurrence.  The patient knows to call if she has any of these to be seen in clinic.  She will alternate visits between my clinic and radiation oncology every 3 months for the first couple of years after treatment.  Plan will be, per NCCN and SGO guidelines, to perform a Pap test yearly.  Imaging will only be performed for concerning symptoms or exam findings that raise suspicion for recurrence.  32 minutes of total time was spent for this patient encounter, including preparation, face-to-face counseling with the patient and coordination of care, and documentation of the encounter.  Jeral Pinch, MD  Division of Gynecologic Oncology  Department of Obstetrics and Gynecology  [facility]

## 2021-12-16 NOTE — Patient Instructions (Addendum)
It was good to see you today!  I will release your Pap test results in my chart once they are back.  We will continue with visits every 3 months.  I will see you back at the end of June for your next follow-up visit with me.  If you develop any concerning symptoms, such as vaginal bleeding or pelvic pain, please call to see me sooner.  I will let you know your urine test when I get them.  If it looks like you have a urine infection, we will plan to treat that with antibiotics.  If not, the symptoms you are having may be related to side effects from your radiation.  We will talk about that further once we get the urine test results.

## 2021-12-18 LAB — URINE CULTURE: Culture: NO GROWTH

## 2021-12-20 ENCOUNTER — Encounter: Payer: Self-pay | Admitting: Gynecologic Oncology

## 2021-12-21 LAB — CYTOLOGY - PAP
Comment: NEGATIVE
Diagnosis: UNDETERMINED — AB
High risk HPV: NEGATIVE

## 2022-01-03 DIAGNOSIS — N6311 Unspecified lump in the right breast, upper outer quadrant: Secondary | ICD-10-CM | POA: Diagnosis not present

## 2022-01-03 DIAGNOSIS — R928 Other abnormal and inconclusive findings on diagnostic imaging of breast: Secondary | ICD-10-CM | POA: Diagnosis not present

## 2022-01-03 DIAGNOSIS — N6312 Unspecified lump in the right breast, upper inner quadrant: Secondary | ICD-10-CM | POA: Diagnosis not present

## 2022-01-26 DIAGNOSIS — F4322 Adjustment disorder with anxiety: Secondary | ICD-10-CM | POA: Diagnosis not present

## 2022-01-26 DIAGNOSIS — Z1211 Encounter for screening for malignant neoplasm of colon: Secondary | ICD-10-CM | POA: Diagnosis not present

## 2022-01-26 DIAGNOSIS — E063 Autoimmune thyroiditis: Secondary | ICD-10-CM | POA: Diagnosis not present

## 2022-01-26 DIAGNOSIS — C539 Malignant neoplasm of cervix uteri, unspecified: Secondary | ICD-10-CM | POA: Diagnosis not present

## 2022-03-01 NOTE — Progress Notes (Signed)
?Radiation Oncology         ([phone]) [phone] ?________________________________ ? ?Name: Kelly Hanson MRN: [id number]  ?Date: 03/02/2022  DOB: 1960-12-28 ? ?Follow-Up Visit Note ? ?CC: [name], MD  [name], MD ? ?  ICD-10-CM   ?1. Malignant neoplasm of endocervix (HCC)  C53.0   ?  ? ? ?Diagnosis:  Clinical stage IIB squamous cell carcinoma of the cervix ? ?Interval Since Last Radiation: 1 year, 6 months, and 10 days  ? ?Radiation Treatment Dates: 06/07/2020 through 07/19/2020 ?Site Technique Total Dose (Gy) Dose per Fx (Gy) Completed Fx Beam Energies  ?Cervix: Cervix IMRT 45/45 1.8 25/25 6X  ?Cervix: Cervix_Bst 3D 9/9 1.8 5/5 15X  ? ?HDR brachytherapy: 07/26/2020, 08/06/2020, 08/09/2020, 08/17/2020, 08/23/2020 ? ?Narrative:  The patient returns today for routine 6 month follow-up. Since her last visit, the patient followed up with Dr. Berline Lopes on 12/16/21. During which time, the patient denied any vaginal bleeding or discharge other than a small amount of pink spotting with dilator use.  She reported using her dilator once a week and significant improvement of diarrhea since finishing radiation.  She otherwise denied any abdominal or pelvic pain.  She did report several weeks of intermittent urinary frequency and what she described as a small amount of pressure (not pain). Patient was noted as NED on examination during this visit and routine PAP was collected which showed ASCUS. Given reports of urinary symptoms, Dr. Berline Lopes ordered urinalysis and urine culture which was negative for UTI.       ? ?Otherwise, no significant interval history since the patient was last seen.      ? ?On evaluation today the patient does report occasional mild urinary incontinence.  This was present prior to her prior radiation therapy and has not worsened.  We discussed potential referral to physical therapy for this issue but she feels her symptoms are not significant enough to warrant evaluation.  She denies any hematuria.   She occasionally will notice some very mild spotting after intercourse.  She denies any dyspareunia.  She denies any rectal bleeding but does report she may have some hemorrhoids at this time.            ? ?Allergies:  has No Known Allergies. ? ?Meds: ?Current Outpatient Medications  ?Medication Sig Dispense Refill  ? acetaminophen (TYLENOL) 325 MG tablet Take 650 mg by mouth every 6 (six) hours as needed for moderate pain.     ? ALPRAZolam (XANAX) 0.5 MG tablet Take 0.5 mg by mouth at bedtime as needed for anxiety.    ? diphenhydramine-acetaminophen (TYLENOL PM) 25-500 MG TABS tablet Take 1 tablet by mouth at bedtime as needed (sleep).     ? escitalopram (LEXAPRO) 5 MG tablet Take 5 mg by mouth daily.    ? levothyroxine (SYNTHROID) 88 MCG tablet Take 88 mcg by mouth daily.    ? diphenhydrAMINE (BENADRYL) 25 MG tablet Take 25 mg by mouth every 6 (six) hours as needed for itching or allergies. (Patient not taking: Reported on 12/14/2021)    ? ?No current facility-administered medications for this encounter.  ? ? ?Physical Findings: ?The patient is in no acute distress. Patient is alert and oriented. ? height is '5\' 7"'$  (1.702 m) and weight is 237 lb 3.2 oz (107.6 kg). Her temperature is 97.7 ?F (36.5 ?C). Her blood pressure is 154/90 (abnormal) and her pulse is 84. Her respiration is 20 and oxygen saturation is 100%. .  No significant changes. Lungs are clear to  auscultation bilaterally. Heart has regular rate and rhythm. No palpable cervical, supraclavicular, or axillary adenopathy. Abdomen soft, non-tender, normal bowel sounds. ? ?On pelvic examination the external genitalia were unremarkable. A speculum exam was performed. There are no mucosal lesions noted in the vaginal vault.  Radiation changes noted in the proximal vagina and cervical region.  Tissues bleeds easily with examination.  On bimanual and rectovaginal examination the cervix is smooth without any significant nodularity and appears normal in size.  Rectal  sphincter tone good.  External hemorrhoids noted. ? ? ?Lab Findings: ?Lab Results  ?Component Value Date  ? WBC 4.1 12/15/2020  ? HGB 13.5 12/15/2020  ? HCT 40.5 12/15/2020  ? MCV 96.4 12/15/2020  ? PLT 197 12/15/2020  ? ? ?Radiographic Findings: ?No results found. ? ?Impression:  Clinical stage IIB squamous cell carcinoma of the cervix ? ?The patient is recovering from the effects of radiation.  Diarrhea is improved over the past few months.  She only has very intermittent episodes related to eating fruits and vegetables. ? ?No evidence of recurrence on clinical exam today ? ?Plan: She will see Dr. Berline Lopes in 3 months.  Follow-up in radiation oncology in 6 months.  Pap smear again in January 2024 ? ? ?25 minutes of total time was spent for this patient encounter, including preparation, face-to-face counseling with the patient and coordination of care, physical exam, and documentation of the encounter. ?____________________________________ ? ?Blair Promise, PhD, MD ? ? ?This document serves as a record of services personally performed by Gery Pray, MD. It was created on his behalf by Roney Mans, a trained medical scribe. The creation of this record is based on the scribe's personal observations and the provider's statements to them. This document has been checked and approved by the attending provider. ? ?

## 2022-03-02 ENCOUNTER — Encounter: Payer: Self-pay | Admitting: Radiation Oncology

## 2022-03-02 ENCOUNTER — Ambulatory Visit
Admission: RE | Admit: 2022-03-02 | Discharge: 2022-03-02 | Disposition: A | Discharge status: Home / Self Care | Payer: BC Managed Care – PPO | Source: Ambulatory Visit | Attending: Radiation Oncology | Admitting: Radiation Oncology

## 2022-03-02 ENCOUNTER — Other Ambulatory Visit: Payer: Self-pay

## 2022-03-02 VITALS — BP 154/90 | HR 84 | Temp 97.7°F | Resp 20 | Ht 67.0 in | Wt 237.2 lb

## 2022-03-02 DIAGNOSIS — Z8542 Personal history of malignant neoplasm of other parts of uterus: Secondary | ICD-10-CM | POA: Diagnosis not present

## 2022-03-02 DIAGNOSIS — Z79899 Other long term (current) drug therapy: Secondary | ICD-10-CM | POA: Diagnosis not present

## 2022-03-02 DIAGNOSIS — Z923 Personal history of irradiation: Secondary | ICD-10-CM | POA: Insufficient documentation

## 2022-03-02 DIAGNOSIS — Z8541 Personal history of malignant neoplasm of cervix uteri: Secondary | ICD-10-CM | POA: Insufficient documentation

## 2022-03-02 DIAGNOSIS — Z08 Encounter for follow-up examination after completed treatment for malignant neoplasm: Secondary | ICD-10-CM | POA: Diagnosis not present

## 2022-03-02 DIAGNOSIS — C530 Malignant neoplasm of endocervix: Secondary | ICD-10-CM

## 2022-03-02 DIAGNOSIS — R197 Diarrhea, unspecified: Secondary | ICD-10-CM | POA: Diagnosis not present

## 2022-03-02 NOTE — Progress Notes (Signed)
Kelly Hanson is here today for follow up post radiation to the pelvis. ? ?They completed their radiation on: 08/23/2020  ? ?Does the patient complain of any of the following: ? ?Pain:denies ?Abdominal bloating: denies ?Diarrhea/Constipation: Occasional diarrhea "when I eat healthy" ?Nausea/Vomiting: denies ?Vaginal Discharge: denies ?Blood in Urine or Stool: denies ?Urinary Issues (dysuria/incomplete emptying/ incontinence/ increased frequency/urgency): incomplete bladder emptying, mild incontinence, frequency, nocturia x1 ?Does patient report using vaginal dilator 2-3 times a week and/or sexually active 2-3 weeks: yes ?Post radiation skin changes: denies ? ? ?Additional comments if applicable: nothing of note ? ?Vitals:  ? 03/02/22 0857  ?BP: (!) 154/90  ?Pulse: 84  ?Resp: 20  ?Temp: 97.7 ?F (36.5 ?C)  ?SpO2: 100%  ?Weight: 237 lb 3.2 oz (107.6 kg)  ?Height: '5\' 7"'$  (1.702 m)  ? ? ?

## 2022-03-20 ENCOUNTER — Ambulatory Visit: Payer: BC Managed Care – PPO | Admitting: Gynecologic Oncology

## 2022-06-02 ENCOUNTER — Encounter: Payer: Self-pay | Admitting: Gynecologic Oncology

## 2022-06-05 ENCOUNTER — Inpatient Hospital Stay: Payer: BC Managed Care – PPO | Attending: Gynecologic Oncology | Admitting: Gynecologic Oncology

## 2022-06-05 ENCOUNTER — Other Ambulatory Visit: Payer: Self-pay

## 2022-06-05 VITALS — BP 147/111 | HR 80 | Temp 97.7°F | Resp 18 | Ht 67.0 in | Wt 219.8 lb

## 2022-06-05 DIAGNOSIS — Z8541 Personal history of malignant neoplasm of cervix uteri: Secondary | ICD-10-CM

## 2022-06-05 DIAGNOSIS — R519 Headache, unspecified: Secondary | ICD-10-CM | POA: Diagnosis not present

## 2022-06-05 DIAGNOSIS — Z923 Personal history of irradiation: Secondary | ICD-10-CM | POA: Insufficient documentation

## 2022-06-05 DIAGNOSIS — Z9221 Personal history of antineoplastic chemotherapy: Secondary | ICD-10-CM | POA: Insufficient documentation

## 2022-06-05 DIAGNOSIS — C539 Malignant neoplasm of cervix uteri, unspecified: Secondary | ICD-10-CM

## 2022-06-05 DIAGNOSIS — Z87891 Personal history of nicotine dependence: Secondary | ICD-10-CM | POA: Diagnosis not present

## 2022-07-27 DIAGNOSIS — C539 Malignant neoplasm of cervix uteri, unspecified: Secondary | ICD-10-CM | POA: Diagnosis not present

## 2022-07-27 DIAGNOSIS — N200 Calculus of kidney: Secondary | ICD-10-CM | POA: Diagnosis not present

## 2022-07-27 DIAGNOSIS — R928 Other abnormal and inconclusive findings on diagnostic imaging of breast: Secondary | ICD-10-CM | POA: Diagnosis not present

## 2022-07-27 DIAGNOSIS — E063 Autoimmune thyroiditis: Secondary | ICD-10-CM | POA: Diagnosis not present

## 2022-07-27 DIAGNOSIS — R319 Hematuria, unspecified: Secondary | ICD-10-CM | POA: Diagnosis not present

## 2022-08-09 DIAGNOSIS — R928 Other abnormal and inconclusive findings on diagnostic imaging of breast: Secondary | ICD-10-CM | POA: Diagnosis not present

## 2022-09-06 NOTE — Progress Notes (Signed)
Radiation Oncology         [phone]) [phone] ________________________________  Name: Kelly Hanson MRN: [id number]  Date: 09/07/2022  DOB: [birth year]  Follow-Up Visit Note  CC: [name], MD  [name], MD  No diagnosis found.  Diagnosis: Clinical stage IIB squamous cell carcinoma of the cervix  Interval Since Last Radiation: 2 years and 15 days   Radiation Treatment Dates: 06/07/2020 through 07/19/2020 Site Technique Total Dose (Gy) Dose per Fx (Gy) Completed Fx Beam Energies  Cervix: Cervix IMRT 45/45 1.8 25/25 6X  Cervix: Cervix_Bst 3D 9/9 1.8 5/5 15X    HDR brachytherapy: 07/26/2020, 08/06/2020, 08/09/2020, 08/17/2020, 08/23/2020   Narrative:  The patient returns today for routine follow-up, she was last seen here for follow up on 03/02/22. Since her last visit, the patient followed up with Dr. Berline Lopes on 06/05/22. During which time, the patient denied any symptoms concerning for disease recurrence and was noted as NED on examination. She was encouraged her to use her vaginal dilator more regularly.     Otherwise, no significant interval history since the patient was last seen.   ***                            Allergies:  has No Known Allergies.  Meds: Current Outpatient Medications  Medication Sig Dispense Refill   acetaminophen (TYLENOL) 325 MG tablet Take 650 mg by mouth every 6 (six) hours as needed for moderate pain.      ALPRAZolam (XANAX) 0.5 MG tablet Take 0.5 mg by mouth at bedtime as needed for anxiety.     diphenhydramine-acetaminophen (TYLENOL PM) 25-500 MG TABS tablet Take 1 tablet by mouth at bedtime as needed (sleep).      escitalopram (LEXAPRO) 5 MG tablet Take 5 mg by mouth daily.     levothyroxine (SYNTHROID) 88 MCG tablet Take 88 mcg by mouth daily.     No current facility-administered medications for this encounter.    Physical Findings: The patient is in no acute distress. Patient is alert and oriented.  vitals were not taken for this visit.  .  No significant changes. Lungs are clear to auscultation bilaterally. Heart has regular rate and rhythm. No palpable cervical, supraclavicular, or axillary adenopathy. Abdomen soft, non-tender, normal bowel sounds.  On pelvic examination the external genitalia were unremarkable. A speculum exam was performed. There are no mucosal lesions noted in the vaginal vault. A Pap smear was obtained of the proximal vagina. On bimanual and rectovaginal examination there were no pelvic masses appreciated. ***    Lab Findings: Lab Results  Component Value Date   WBC 4.1 12/15/2020   HGB 13.5 12/15/2020   HCT 40.5 12/15/2020   MCV 96.4 12/15/2020   PLT 197 12/15/2020    Radiographic Findings: No results found.  Impression:  Clinical stage IIB squamous cell carcinoma of the cervix  The patient is recovering from the effects of radiation.  ***  Plan:  ***   *** minutes of total time was spent for this patient encounter, including preparation, face-to-face counseling with the patient and coordination of care, physical exam, and documentation of the encounter. ____________________________________  Blair Promise, PhD, MD  This document serves as a record of services personally performed by Gery Pray, MD. It was created on his behalf by Roney Mans, a trained medical scribe. The creation of this record is based on the scribe's personal observations and the provider's statements to them. This document has  been checked and approved by the attending provider.

## 2022-09-07 ENCOUNTER — Ambulatory Visit
Admission: RE | Admit: 2022-09-07 | Discharge: 2022-09-07 | Disposition: A | Discharge status: Home / Self Care | Payer: BC Managed Care – PPO | Source: Ambulatory Visit | Attending: Family Medicine | Admitting: Family Medicine

## 2022-09-07 ENCOUNTER — Ambulatory Visit
Admission: RE | Admit: 2022-09-07 | Discharge: 2022-09-07 | Disposition: A | Discharge status: Home / Self Care | Payer: BC Managed Care – PPO | Source: Ambulatory Visit | Attending: Radiation Oncology | Admitting: Radiation Oncology

## 2022-09-07 DIAGNOSIS — Z79899 Other long term (current) drug therapy: Secondary | ICD-10-CM | POA: Insufficient documentation

## 2022-09-07 DIAGNOSIS — C530 Malignant neoplasm of endocervix: Secondary | ICD-10-CM

## 2022-09-07 DIAGNOSIS — Z923 Personal history of irradiation: Secondary | ICD-10-CM | POA: Diagnosis not present

## 2022-09-07 DIAGNOSIS — Z8541 Personal history of malignant neoplasm of cervix uteri: Secondary | ICD-10-CM | POA: Insufficient documentation

## 2022-09-07 LAB — URINALYSIS, COMPLETE (UACMP) WITH MICROSCOPIC
Bilirubin Urine: NEGATIVE
Glucose, UA: NEGATIVE mg/dL
Ketones, ur: 5 mg/dL — AB
Nitrite: NEGATIVE
Protein, ur: 100 mg/dL — AB
RBC / HPF: >50 RBC/hpf — ABNORMAL HIGH (ref 0–5)
Specific Gravity, Urine: 1.026 (ref 1.005–1.030)
WBC, UA: >50 WBC/hpf — ABNORMAL HIGH (ref 0–5)
pH: 5 (ref 5.0–8.0)

## 2022-09-07 NOTE — Progress Notes (Signed)
Kelly Hanson is here today for follow up post radiation to the pelvic.  They completed their radiation on: 07/19/20   Does the patient complain of any of the following:  Pain: Denies pain today, however reports pain this week to left flank area.  Abdominal bloating: No Diarrhea/Constipation: diarrhea Nausea/Vomiting: No Vaginal Discharge: no Blood in Urine or Stool: Reports having some small blood clots after urinating.  Urinary Issues (dysuria/incomplete emptying/ incontinence/ increased frequency/urgency): Incomplete bladder emptying. Patient thinks she has kidney stones. Urinary incontinence. Does patient report using vaginal dilator 2-3 times a week and/or sexually active 2-3 weeks: Yes, patient reports small amount of blood after using dilators.  Post radiation skin changes: No   Additional comments if applicable:  Patient had UA C&S performed by PCP on 07/27/22. Normal results.   BP (!) 156/99 (BP Location: Left Arm, Patient Position: Sitting)   Pulse 97   Temp 98 F (36.7 C) (Oral)   Resp 18   Ht '5\' 7"'$  (1.702 m)   Wt 207 lb 4 oz (94 kg)   SpO2 100%   BMI 32.46 kg/m

## 2022-09-08 ENCOUNTER — Telehealth: Payer: Self-pay | Admitting: *Deleted

## 2022-09-08 LAB — URINE CULTURE: Culture: <10000 — AB

## 2022-09-08 NOTE — Telephone Encounter (Signed)
Called and scheduled patient for a follow up  with Dr Berline Lopes

## 2022-09-12 ENCOUNTER — Other Ambulatory Visit: Payer: Self-pay

## 2022-09-12 ENCOUNTER — Telehealth: Payer: Self-pay

## 2022-09-12 ENCOUNTER — Inpatient Hospital Stay: Payer: BC Managed Care – PPO | Attending: Gynecologic Oncology

## 2022-09-12 ENCOUNTER — Inpatient Hospital Stay (HOSPITAL_BASED_OUTPATIENT_CLINIC_OR_DEPARTMENT_OTHER): Payer: BC Managed Care – PPO | Admitting: Gynecologic Oncology

## 2022-09-12 VITALS — BP 144/95 | HR 98 | Temp 97.9°F | Resp 18 | Ht 66.54 in | Wt 207.0 lb

## 2022-09-12 DIAGNOSIS — Z923 Personal history of irradiation: Secondary | ICD-10-CM

## 2022-09-12 DIAGNOSIS — R1011 Right upper quadrant pain: Secondary | ICD-10-CM

## 2022-09-12 DIAGNOSIS — M546 Pain in thoracic spine: Secondary | ICD-10-CM | POA: Diagnosis not present

## 2022-09-12 DIAGNOSIS — C782 Secondary malignant neoplasm of pleura: Secondary | ICD-10-CM | POA: Diagnosis not present

## 2022-09-12 DIAGNOSIS — Z87891 Personal history of nicotine dependence: Secondary | ICD-10-CM | POA: Diagnosis not present

## 2022-09-12 DIAGNOSIS — Z8541 Personal history of malignant neoplasm of cervix uteri: Secondary | ICD-10-CM

## 2022-09-12 DIAGNOSIS — Z9221 Personal history of antineoplastic chemotherapy: Secondary | ICD-10-CM | POA: Diagnosis not present

## 2022-09-12 DIAGNOSIS — C7800 Secondary malignant neoplasm of unspecified lung: Secondary | ICD-10-CM | POA: Insufficient documentation

## 2022-09-12 DIAGNOSIS — C539 Malignant neoplasm of cervix uteri, unspecified: Secondary | ICD-10-CM | POA: Insufficient documentation

## 2022-09-12 DIAGNOSIS — R350 Frequency of micturition: Secondary | ICD-10-CM

## 2022-09-12 DIAGNOSIS — Z79899 Other long term (current) drug therapy: Secondary | ICD-10-CM | POA: Insufficient documentation

## 2022-09-12 DIAGNOSIS — C7951 Secondary malignant neoplasm of bone: Secondary | ICD-10-CM | POA: Diagnosis not present

## 2022-09-12 DIAGNOSIS — N1330 Unspecified hydronephrosis: Secondary | ICD-10-CM | POA: Diagnosis not present

## 2022-09-12 NOTE — Progress Notes (Signed)
Gynecologic Oncology Return Clinic Visit  09/12/22  Reason for Visit: concern for possible recurrence based on recent exam  Treatment History: Oncology History  Cervical cancer ([place])  02/09/2020 Initial Diagnosis   Between 6-8 months ago, the patient endorses having irregular spotting intermittnetly every few weeks that would last for a few days. This did not change over time and she describes it as light spotting. This was the first bleeding she had after menopause. She ultimately saw her PCP who performed a pap in March which returned showing HSIL. The patient tells me today that this is the first pap test she has had ever. She was then referred to gyn and underwent EMB/LEEP and transvaginal ultrasound for work-up of her PMB and high grade cervical dysplasia. Unfortunately, her pathology revealed SCC of the cervix   05/03/2020 Pathology Results   Endocervical curettings show fragments of squamous epithelium with high-grade squamous intraepithelial lesion, CIN-3 as well as fragments of invasive squamous cell carcinoma within the endometrial biopsy.  The invasive squamous cell carcinoma have depth of invasion more than 3 mm, with at least 10 mm, thickness almost 1.2 mm.   05/18/2020 Imaging   1. No findings of metastatic disease to the abdomen/pelvis. 2. There is a small amount of gas either along the cervical canal or the adjacent fornix. A well-defined mass is not seen. 3. Chronic bilateral pars defects at L5 with 7 mm grade 1 anterolisthesis of L5 on S1 and resulting mild left foraminal stenosis. 4. Suspected right foraminal disc protrusion at L3-4 possibly causing mild right foraminal impingement.     05/21/2020 Cancer Staging   Staging form: Cervix Uteri, AJCC Version 9 - Clinical stage from 05/21/2020: FIGO Stage IIB (cT2b, cM0) - Signed by Heath Lark, MD on 05/21/2020   05/26/2020 PET scan   1. Intensely hypermetabolic circumferential metabolic activity in the uterine cervix consistent with  cervical carcinoma. 2. No evidence of metastatic disease in the pelvis. No metastatic lymphadenopathy. 3. No evidence of distant metastatic disease. 4. Focus of metabolic activity in the gastric antrum would be unlikely location for a cervical carcinoma metastasis. Favor gastritis or potentially primary gastric neoplasm. Consider upper endoscopy for further evaluation. 5. Diffuse activity in the thyroid gland is favored thyroiditis.   06/03/2020 Procedure   Status post right IJ port catheter.   06/07/2020 - 07/07/2020 Chemotherapy   The patient had weekly cisplatin for chemotherapy treatment.     06/07/2020 - 08/23/2020 Radiation Therapy    Radiation Treatment Dates: 06/07/2020 through 07/19/2020 Site Technique Total Dose (Gy) Dose per Fx (Gy) Completed Fx Beam Energies  Cervix: Cervix IMRT 45/45 1.8 25/25 6X  Cervix: Cervix_Bst 3D 9/9 1.8 5/5 15X    HDR brachytherapy: 07/26/2020, 08/06/2020, 08/09/2020, 08/17/2020, 08/23/2020     11/22/2020 PET scan   Marked interval response to therapy with no visible mass at the site of previous ill-defined thickening of the cervix, and marked interval decrease in FDG uptake associated with this area as outlined above.   No signs of new disease in the neck, chest, abdomen or of the pelvis   Markedly hypermetabolic thyroid gland likely related to thyroiditis is minimally changed, correlate with thyroid function. Given asymmetric enlargement of the RIGHT thyroid lobe would also suggest thyroid ultrasound for further evaluation.     12/15/2020 Procedure   Removal of implanted Port-A-Cath utilizing sharp and blunt dissection. The procedure was uncomplicated.     05/24/2021 Pathology Results   Cervical biopsy showed necroinflammatory debris    Pap  performed 12/16/21: ASCUS, HR HPV negative  Interval History: Patient reports about a month ago waking up with very sharp left mid back pain.  Thought she would have to come into the emergency department.  This  then improved when she developed right mid back pain.  She thought maybe this was related to a kidney stone.  After this improved, she developed right upper quadrant pain that she describes as feeling like sandpaper.  This has subsequently improved.  She reports passing some blood clots as if she is having menstrual cycle.  She does not feel that this is vaginal bleeding.  Often times she has to push to release a blood clot before she is able to start her stream of urine.  She is continuing to use her vaginal dilator about twice a week although used it less during the month where she was not feeling well.  She has just some minimal spotting with the dilator use as she previously did.  Denies any discharge.  Her diarrhea has resolved although she had an episode last Thursday when she came to see Dr. Sondra Come and had some this morning before her visit with me.  She thinks that her incontinence has worsened a little in the last month or so.  She also notes a stronger smell to her urine.  She is working on losing weight.  Has lost about 33 pounds since March.  She has increased her fruit and vegetable intake.  She has stopped eating sweets and has significantly decreased how much fried food she is having.  Reports feeling better today than she has in the last month.  Past Medical/Surgical History: Past Medical History:  Diagnosis Date   Anxiety    Cervical cancer [facility])    History of radiation therapy 06/07/2020-07/19/2020   Cervix IMRT ; Dr. Gery Pray   History of radiation therapy 08/23/2020   cervix HDR 07/26/2020-08/23/2020  Dr Gery Pray   Obesity (BMI 30-39.9)    Palpitations    Thyroid disease     Past Surgical History:  Procedure Laterality Date   IR IMAGING GUIDED PORT INSERTION  06/03/2020   IR REMOVAL TUN ACCESS W/ PORT W/O FL MOD SED  12/15/2020   OPERATIVE ULTRASOUND N/A 07/26/2020   Procedure: OPERATIVE ULTRASOUND;  Surgeon: Gery Pray, MD;  Location: [facility];  Service: Urology;  Laterality: N/A;   OPERATIVE ULTRASOUND N/A 08/06/2020   Procedure: OPERATIVE ULTRASOUND;  Surgeon: Gery Pray, MD;  Location: [facility];  Service: Urology;  Laterality: N/A;   OPERATIVE ULTRASOUND N/A 08/09/2020   Procedure: OPERATIVE ULTRASOUND;  Surgeon: Gery Pray, MD;  Location: [facility];  Service: Urology;  Laterality: N/A;   OPERATIVE ULTRASOUND N/A 08/17/2020   Procedure: OPERATIVE ULTRASOUND;  Surgeon: Gery Pray, MD;  Location: [facility];  Service: Urology;  Laterality: N/A;   OPERATIVE ULTRASOUND N/A 08/23/2020   Procedure: OPERATIVE ULTRASOUND;  Surgeon: Gery Pray, MD;  Location: [facility];  Service: Urology;  Laterality: N/A;   TANDEM RING INSERTION  08/06/2020   TANDEM RING INSERTION N/A 07/26/2020   Procedure: TANDEM RING INSERTION FOR HIGH DOSE RADIATION THERAPY;  Surgeon: Gery Pray, MD;  Location: [facility];  Service: Urology;  Laterality: N/A;   TANDEM RING INSERTION N/A 08/06/2020   Procedure: TANDEM RING INSERTION;  Surgeon: Gery Pray, MD;  Location: [facility];  Service: Urology;  Laterality: N/A;   TANDEM RING INSERTION N/A 08/09/2020  Procedure: TANDEM RING INSERTION;  Surgeon: Gery Pray, MD;  Location: [facility];  Service: Urology;  Laterality: N/A;   TANDEM RING INSERTION N/A 08/17/2020   Procedure: TANDEM RING INSERTION;  Surgeon: Gery Pray, MD;  Location: [facility];  Service: Urology;  Laterality: N/A;   TANDEM RING INSERTION N/A 08/23/2020   Procedure: TANDEM RING INSERTION;  Surgeon: Gery Pray, MD;  Location: [facility];  Service: Urology;  Laterality: N/A;   THERAPEUTIC ABORTION      Family History  Problem Relation Age of Onset   Stroke Maternal Grandmother    Colon cancer Neg Hx    Breast cancer Neg Hx    Endometrial cancer Neg Hx    Ovarian cancer Neg Hx      Social History   Socioeconomic History   Marital status: Married    Spouse name: Dominica Severin   Number of children: 0   Years of education: Not on file   Highest education level: Not on file  Occupational History   Occupation: accountant  Tobacco Use   Smoking status: Former   Smokeless tobacco: Never   Tobacco comments:    quit 2019  Vaping Use   Vaping Use: Never used  Substance and Sexual Activity   Alcohol use: Yes    Alcohol/week: 1.0 standard drink of alcohol    Types: 1 Glasses of wine per week    Comment: occ   Drug use: Never   Sexual activity: Yes  Other Topics Concern   Not on file  Social History Narrative   Not on file   Social Determinants of Health   Financial Resource Strain: Not on file  Food Insecurity: Not on file  Transportation Needs: Not on file  Physical Activity: Not on file  Stress: Not on file  Social Connections: Not on file    Current Medications:  Current Outpatient Medications:    acetaminophen (TYLENOL) 325 MG tablet, Take 650 mg by mouth every 6 (six) hours as needed for moderate pain. , Disp: , Rfl:    ALPRAZolam (XANAX) 0.5 MG tablet, Take 0.5 mg by mouth at bedtime as needed for anxiety., Disp: , Rfl:    diphenhydramine-acetaminophen (TYLENOL PM) 25-500 MG TABS tablet, Take 1 tablet by mouth at bedtime as needed (sleep). , Disp: , Rfl:    escitalopram (LEXAPRO) 5 MG tablet, Take 5 mg by mouth daily., Disp: , Rfl:    Ibuprofen 200 MG CAPS, Take 200 mg by mouth as needed. 2 tabs prn, Disp: , Rfl:    levothyroxine (SYNTHROID) 88 MCG tablet, Take 88 mcg by mouth daily., Disp: , Rfl:   Review of Systems: + Urinary frequency, hematuria, incontinence, back pain. Denies appetite changes, fevers, chills, fatigue, unexplained weight changes. Denies hearing loss, neck lumps or masses, mouth sores, ringing in ears or voice changes. Denies cough or wheezing.  Denies shortness of breath. Denies chest pain or palpitations. Denies leg  swelling. Denies abdominal distention, pain, blood in stools, constipation, diarrhea, nausea, vomiting, or early satiety. Denies pain with intercourse, dysuria. Denies hot flashes, pelvic pain, vaginal bleeding or vaginal discharge.   Denies joint pain or muscle pain/cramps. Denies itching, rash, or wounds. Denies dizziness, headaches, numbness or seizures. Denies swollen lymph nodes or glands, denies easy bruising or bleeding. Denies anxiety, depression, confusion, or decreased concentration.  Physical Exam: BP (!) 144/95 (BP Location: Left Arm, Patient Position: Sitting) Comment: informed Dr Berline Lopes  Pulse 98   Temp 97.9 F (36.6 C) (  Oral)   Resp 18   Ht 5' 6.54" (1.69 m)   Wt 207 lb (93.9 kg)   SpO2 100%   BMI 32.88 kg/m  General: Alert, oriented, no acute distress. HEENT: Normocephalic, atraumatic, sclera anicteric. Chest: Clear to auscultation bilaterally.  No wheezes or rhonchi. Cardiovascular: Regular rate and rhythm, no murmurs. Back: No CVA tenderness. Abdomen: soft, nontender.  Normoactive bowel sounds.  No masses or hepatosplenomegaly appreciated.  Well-healed scar. Extremities: Grossly normal range of motion.  Warm, well perfused.  No edema bilaterally. Skin: No rashes or lesions noted. Lymphatics: No cervical, supraclavicular, or inguinal adenopathy. GU: Normal appearing external genitalia without erythema, excoriation, or lesions.  Speculum narrowed at the apex, unable to see the cervix. Necrotic appearing tissue along the upper anterior vagina versus anterior cervix. On bimanual, there is a crater replacing the cervix with firm ridge of tissue circumferentially around the upper vagina/cervix. This firmness is appreciated on RV exam.   Vaginal/cervical biopsy Preoperative diagnosis: Concern for cervical cancer recurrence Postoperative diagnosis: Same as above Physician: Berline Lopes MD Estimated blood loss: Minimal Specimens: Anterior vaginal versus cervical  biopsy Procedure: After the procedure was discussed with the patient including risks and benefits, she gave verbal consent.  She was then placed in dorsolithotomy position and a speculum was placed in the vagina.  Once the upper vagina/cervix was visualized, it was cleansed with Betadine x3.  Tischler forceps were then used to biopsy in the area of necrotic tissue.  Multiple biopsies taken.  This was placed in formalin.  Overall the patient tolerated the procedure well.  All instruments were removed from the vagina.  Laboratory & Radiologic Studies: None new  Assessment & Plan: Kelly Hanson is a 61 y.o. woman with Stage IIB squamous cell carcinoma of the cervix who is status post definitive chemoradiation with findings on exam today concerning for recurrence.   Biopsy taken today.  Discussed findings with the patient.  While these may be due to necrosis from radiation, I am concerned about the firmness of the tissue and significant change since her last visit with me.  I will call her with biopsy results.  Even if biopsies do not show recurrent cancer, I am recommending that we proceed with scheduling a PET scan as well.  This was done today.  Given her urinary symptoms, will send another urine culture today.  Her UA from recent visit last week is suspicious for an infection but the culture grew less than 10,000 colony-forming units.   22 minutes of total time was spent for this patient encounter, including preparation, face-to-face counseling with the patient and coordination of care, and documentation of the encounter.  Jeral Pinch, MD  Division of Gynecologic Oncology  Department of Obstetrics and Gynecology  [facility]

## 2022-09-12 NOTE — Patient Instructions (Signed)
We will get another urine culture today.  Your urinalysis is suspicious for a possible infection but the urine culture itself did not show any significant bacterial growth.  I will call you when I get the biopsy results back in the next couple of days.  In the meantime, we will get you scheduled for a PET scan to evaluate your cancer.  I am concerned based on what I am feeling on your exam today that your cancer may have come back.

## 2022-09-12 NOTE — Telephone Encounter (Signed)
Call placed to make patient aware of UA C&S results. Patient informed that there was insignificant growth on culture. Patient questions wheatear she could still be treated based off of UA results. Patient to see Dr.Tucker today @ 12p. If no UA collected at that appointment she will reach out to Dr. Clabe Seal office.

## 2022-09-14 LAB — URINE CULTURE: Culture: NO GROWTH

## 2022-09-15 ENCOUNTER — Telehealth: Payer: Self-pay | Admitting: Gynecologic Oncology

## 2022-09-15 LAB — SURGICAL PATHOLOGY

## 2022-09-15 NOTE — Telephone Encounter (Signed)
I called patient to discuss biopsy results.  No answer.  Left message with some details.  Also released the biopsy report to her with additional details.  Will await upcoming imaging results before deciding whether she needs additional attempt at biopsies.  Jeral Pinch MD Gynecologic Oncology

## 2022-09-25 ENCOUNTER — Ambulatory Visit (HOSPITAL_COMMUNITY)
Admission: RE | Admit: 2022-09-25 | Discharge: 2022-09-25 | Disposition: A | Discharge status: Home / Self Care | Payer: BC Managed Care – PPO | Source: Ambulatory Visit | Attending: Gynecologic Oncology | Admitting: Gynecologic Oncology

## 2022-09-25 DIAGNOSIS — N1330 Unspecified hydronephrosis: Secondary | ICD-10-CM | POA: Diagnosis not present

## 2022-09-25 DIAGNOSIS — C539 Malignant neoplasm of cervix uteri, unspecified: Secondary | ICD-10-CM | POA: Insufficient documentation

## 2022-09-25 DIAGNOSIS — C7951 Secondary malignant neoplasm of bone: Secondary | ICD-10-CM | POA: Insufficient documentation

## 2022-09-25 DIAGNOSIS — C782 Secondary malignant neoplasm of pleura: Secondary | ICD-10-CM | POA: Diagnosis not present

## 2022-09-25 DIAGNOSIS — C55 Malignant neoplasm of uterus, part unspecified: Secondary | ICD-10-CM | POA: Diagnosis not present

## 2022-09-25 LAB — GLUCOSE, CAPILLARY: Glucose-Capillary: 101 mg/dL — ABNORMAL HIGH (ref 70–99)

## 2022-09-25 MED ORDER — FLUDEOXYGLUCOSE F - 18 (FDG) INJECTION
10.3000 | Freq: Once | INTRAVENOUS | Status: AC
  Administered 2022-09-25: 10.3 via INTRAVENOUS

## 2022-09-26 ENCOUNTER — Telehealth: Payer: Self-pay | Admitting: Surgery

## 2022-09-26 ENCOUNTER — Telehealth: Payer: Self-pay | Admitting: Gynecologic Oncology

## 2022-09-26 NOTE — Telephone Encounter (Signed)
Called the patient.  Discussed PET scan findings which confirm widely metastatic recurrent disease.  We will discuss with Dr. Alvy Bimler to get the patient scheduled for appointment to discuss systemic therapy.  We will try to get PD-L1 added to her original biopsy.  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  We will discuss utility of placing a PCN on that side.  Patient may also need some additional imaging to evaluate presence of a colonic fistula.  This is somewhat difficult to appreciate on the PET scan that may require stool diversion.  Jeral Pinch MD Gynecologic Oncology

## 2022-09-26 NOTE — Telephone Encounter (Signed)
Spoke with Kelly Hanson at [facility] pathology. As instructed, faxed written request from [facility] for PDL-1 testing on specimen from 05/03/2020. Specimen ID: [id number] (ECC, Endometrial Biopsy, Cervical LEEP). Requested that if unable to perform PDL-1 testing, please notify clinic at [phone].

## 2022-09-27 ENCOUNTER — Telehealth: Payer: Self-pay

## 2022-09-27 ENCOUNTER — Other Ambulatory Visit: Payer: Self-pay | Admitting: Gynecologic Oncology

## 2022-09-27 DIAGNOSIS — N1339 Other hydronephrosis: Secondary | ICD-10-CM

## 2022-09-27 DIAGNOSIS — C539 Malignant neoplasm of cervix uteri, unspecified: Secondary | ICD-10-CM

## 2022-09-27 NOTE — Telephone Encounter (Signed)
Per Dr. Berline Lopes, Just put in order for NM scan and CT for this patient. She is aware. Could we get them scheduled ASAP?   Pt aware NM renal imaging is scheduled for Monday 10/23 @ 2:30, arrive at 2:00, needs to be well hydrated.  Pt also aware CT scan is also on 10/23 @ 5:30, arrive at 5:15 no prep per [facility] in central scheduling.  Pt agreed with date/times.  Dr. Berline Lopes notified.

## 2022-09-27 NOTE — Telephone Encounter (Signed)
Called and scheduled appt at 3:30 pm tomorrow, arrive 30 mins early. She verbalized understanding.

## 2022-09-27 NOTE — Progress Notes (Signed)
Spoke with Dr. Nance Pew regarding recent PET scan. Although degree of gas is worrisome for colonic fistula, CT with rectal contrast would help to delineate this better. In discussing renal function, there is cortical thinning on left but still evidence of perfusion. He recommended renal perfusion scan to help delineate % function of the left kidney before considering PCN placement. Both gastric and thyroid findings were present in 2021 without significant change.   Jeral Pinch MD Gynecologic Oncology

## 2022-09-28 ENCOUNTER — Other Ambulatory Visit: Payer: Self-pay

## 2022-09-28 ENCOUNTER — Encounter: Payer: Self-pay | Admitting: Radiology

## 2022-09-28 ENCOUNTER — Encounter: Payer: Self-pay | Admitting: Hematology and Oncology

## 2022-09-28 ENCOUNTER — Other Ambulatory Visit (HOSPITAL_COMMUNITY): Payer: Self-pay

## 2022-09-28 ENCOUNTER — Inpatient Hospital Stay: Payer: BC Managed Care – PPO | Admitting: Hematology and Oncology

## 2022-09-28 VITALS — BP 163/99 | HR 100 | Temp 97.9°F | Resp 18 | Ht 66.54 in | Wt 202.0 lb

## 2022-09-28 DIAGNOSIS — C530 Malignant neoplasm of endocervix: Secondary | ICD-10-CM

## 2022-09-28 DIAGNOSIS — N1330 Unspecified hydronephrosis: Secondary | ICD-10-CM | POA: Insufficient documentation

## 2022-09-28 DIAGNOSIS — Z7189 Other specified counseling: Secondary | ICD-10-CM

## 2022-09-28 DIAGNOSIS — C539 Malignant neoplasm of cervix uteri, unspecified: Secondary | ICD-10-CM

## 2022-09-28 DIAGNOSIS — Z923 Personal history of irradiation: Secondary | ICD-10-CM | POA: Diagnosis not present

## 2022-09-28 DIAGNOSIS — C782 Secondary malignant neoplasm of pleura: Secondary | ICD-10-CM | POA: Diagnosis not present

## 2022-09-28 DIAGNOSIS — C7801 Secondary malignant neoplasm of right lung: Secondary | ICD-10-CM

## 2022-09-28 DIAGNOSIS — M546 Pain in thoracic spine: Secondary | ICD-10-CM | POA: Diagnosis not present

## 2022-09-28 DIAGNOSIS — Z87891 Personal history of nicotine dependence: Secondary | ICD-10-CM | POA: Diagnosis not present

## 2022-09-28 DIAGNOSIS — C7951 Secondary malignant neoplasm of bone: Secondary | ICD-10-CM

## 2022-09-28 DIAGNOSIS — Z9221 Personal history of antineoplastic chemotherapy: Secondary | ICD-10-CM | POA: Diagnosis not present

## 2022-09-28 DIAGNOSIS — R1011 Right upper quadrant pain: Secondary | ICD-10-CM | POA: Diagnosis not present

## 2022-09-28 DIAGNOSIS — Z79899 Other long term (current) drug therapy: Secondary | ICD-10-CM | POA: Diagnosis not present

## 2022-09-28 DIAGNOSIS — C7800 Secondary malignant neoplasm of unspecified lung: Secondary | ICD-10-CM | POA: Diagnosis not present

## 2022-09-28 DIAGNOSIS — C7802 Secondary malignant neoplasm of left lung: Secondary | ICD-10-CM

## 2022-09-28 MED ORDER — PROCHLORPERAZINE MALEATE 10 MG PO TABS
10.0000 mg | ORAL_TABLET | Freq: Four times a day (QID) | ORAL | 1 refills | Status: DC | PRN
  Filled 2022-09-28: qty 30, 8d supply, fill #0

## 2022-09-28 MED ORDER — ONDANSETRON HCL 8 MG PO TABS
8.0000 mg | ORAL_TABLET | Freq: Three times a day (TID) | ORAL | 1 refills | Status: DC | PRN
  Filled 2022-09-28: qty 30, 10d supply, fill #0

## 2022-09-28 MED ORDER — DEXAMETHASONE 4 MG PO TABS
ORAL_TABLET | ORAL | 6 refills | Status: DC
  Filled 2022-09-28: qty 8, 28d supply, fill #0

## 2022-09-28 MED ORDER — HYDROMORPHONE HCL 4 MG PO TABS
4.0000 mg | ORAL_TABLET | Freq: Four times a day (QID) | ORAL | 0 refills | Status: DC | PRN
  Filled 2022-09-28: qty 60, 15d supply, fill #0

## 2022-09-28 MED ORDER — LIDOCAINE-PRILOCAINE 2.5-2.5 % EX CREA
TOPICAL_CREAM | CUTANEOUS | 3 refills | Status: DC
  Filled 2022-09-28: qty 30, 14d supply, fill #0

## 2022-09-28 NOTE — Assessment & Plan Note (Signed)
I am concerned about loss of kidney function I recommend blood work today to establish baseline renal function before we proceed

## 2022-09-28 NOTE — Progress Notes (Signed)
[facility] FOLLOW-UP progress notes  Patient Care Team: [name], MD as PCP - General (Family Medicine)  CHIEF COMPLAINTS/PURPOSE OF VISIT:  Recurrent metastatic cervical cancer  HISTORY OF PRESENTING ILLNESS:  Kelly Hanson 61 y.o. female was seen due to abnormal imaging studies She was last seen in 2021 after completion of chemotherapy and radiation treatment She is here accompanied by her husband Over the past month and a half, she started to complain of right anterior chest wall discomfort, fatigue and vaginal spotting She also developed urinary incontinence for about a month She denies shortness of breath or back pain She has some mild constipation Approximately around the time of July, she started to have left flank pain but did not seek medical attention She is only taking Aleve for her chest wall pain She started to have abnormal urination and was seen by radiation oncologist and GYN oncologist leading to imaging study and referral back here for treatment  I reviewed the patient's records extensive and collaborated the history with the patient. Summary of her history is as follows: Oncology History  Cervical cancer ([place])  02/09/2020 Initial Diagnosis   Between 6-8 months ago, the patient endorses having irregular spotting intermittnetly every few weeks that would last for a few days. This did not change over time and she describes it as light spotting. This was the first bleeding she had after menopause. She ultimately saw her PCP who performed a pap in March which returned showing HSIL. The patient tells me today that this is the first pap test she has had ever. She was then referred to gyn and underwent EMB/LEEP and transvaginal ultrasound for work-up of her PMB and high grade cervical dysplasia. Unfortunately, her pathology revealed SCC of the cervix   05/03/2020 Pathology Results   Endocervical curettings show fragments of squamous epithelium with high-grade  squamous intraepithelial lesion, CIN-3 as well as fragments of invasive squamous cell carcinoma within the endometrial biopsy.  The invasive squamous cell carcinoma have depth of invasion more than 3 mm, with at least 10 mm, thickness almost 1.2 mm.   05/18/2020 Imaging   1. No findings of metastatic disease to the abdomen/pelvis. 2. There is a small amount of gas either along the cervical canal or the adjacent fornix. A well-defined mass is not seen. 3. Chronic bilateral pars defects at L5 with 7 mm grade 1 anterolisthesis of L5 on S1 and resulting mild left foraminal stenosis. 4. Suspected right foraminal disc protrusion at L3-4 possibly causing mild right foraminal impingement.     05/21/2020 Cancer Staging   Staging form: Cervix Uteri, AJCC Version 9 - Clinical stage from 05/21/2020: FIGO Stage IVB (rcT4, cN0, cM1) - Signed by Heath Lark, MD on 09/28/2022 Stage prefix: Recurrence   05/26/2020 PET scan   1. Intensely hypermetabolic circumferential metabolic activity in the uterine cervix consistent with cervical carcinoma. 2. No evidence of metastatic disease in the pelvis. No metastatic lymphadenopathy. 3. No evidence of distant metastatic disease. 4. Focus of metabolic activity in the gastric antrum would be unlikely location for a cervical carcinoma metastasis. Favor gastritis or potentially primary gastric neoplasm. Consider upper endoscopy for further evaluation. 5. Diffuse activity in the thyroid gland is favored thyroiditis.   06/03/2020 Procedure   Status post right IJ port catheter.   06/07/2020 - 07/07/2020 Chemotherapy   The patient had weekly cisplatin for chemotherapy treatment.     06/07/2020 - 08/23/2020 Radiation Therapy    Radiation Treatment Dates: 06/07/2020 through 07/19/2020 Site  Technique Total Dose (Gy) Dose per Fx (Gy) Completed Fx Beam Energies  Cervix: Cervix IMRT 45/45 1.8 25/25 6X  Cervix: Cervix_Bst 3D 9/9 1.8 5/5 15X    HDR brachytherapy: 07/26/2020,  08/06/2020, 08/09/2020, 08/17/2020, 08/23/2020     11/22/2020 PET scan   Marked interval response to therapy with no visible mass at the site of previous ill-defined thickening of the cervix, and marked interval decrease in FDG uptake associated with this area as outlined above.   No signs of new disease in the neck, chest, abdomen or of the pelvis   Markedly hypermetabolic thyroid gland likely related to thyroiditis is minimally changed, correlate with thyroid function. Given asymmetric enlargement of the RIGHT thyroid lobe would also suggest thyroid ultrasound for further evaluation.     12/15/2020 Procedure   Removal of implanted Port-A-Cath utilizing sharp and blunt dissection. The procedure was uncomplicated.     05/24/2021 Pathology Results   Cervical biopsy showed necroinflammatory debris   09/26/2022 PET scan   1. Ill-defined hypermetabolic aggressive masslike soft tissue extending from the posterior lower uterine segment/cervix into the left hemipelvis is consistent with recurrent/metastatic disease. The mass appears to invade a portion of the sigmoid colon and the urinary bladder with gas seen in the central portion of the mass likely reflecting a fistula. 2. Hypermetabolic right-sided pulmonary and pleural metastases. 3. Hypermetabolic osseous metastatic lesions centered at the L3-L4 vertebral body and right seventh rib. 4. Hypermetabolic nodular thickening of the gastric antrum, nonspecific consider correlation with upper endoscopy.  5. Asymmetric hypermetabolic activity along the left glossotonsillar sulcus is nonspecific consider correlation with direct visualization 6. Hypermetabolic 15 mm right thyroid nodule, suggest further evaluation with dedicated thyroid ultrasound. 7. Left hydroureteronephrosis to the level of the pelvic mass with associated cortical renal atrophy.   10/09/2022 -  Chemotherapy   Patient is on Treatment Plan : Cervical cancer Carboplatin + Paclitaxel +  Bevacizumab q21d        MEDICAL HISTORY:  Past Medical History:  Diagnosis Date   Anxiety    Cervical cancer ([place])    History of radiation therapy 06/07/2020-07/19/2020   Cervix IMRT ; Dr. Gery Pray   History of radiation therapy 08/23/2020   cervix HDR 07/26/2020-08/23/2020  Dr Gery Pray   Obesity (BMI 30-39.9)    Palpitations    Thyroid disease     SURGICAL HISTORY: Past Surgical History:  Procedure Laterality Date   IR IMAGING GUIDED PORT INSERTION  06/03/2020   IR REMOVAL TUN ACCESS W/ PORT W/O FL MOD SED  12/15/2020   OPERATIVE ULTRASOUND N/A 07/26/2020   Procedure: OPERATIVE ULTRASOUND;  Surgeon: Gery Pray, MD;  Location: [facility];  Service: Urology;  Laterality: N/A;   OPERATIVE ULTRASOUND N/A 08/06/2020   Procedure: OPERATIVE ULTRASOUND;  Surgeon: Gery Pray, MD;  Location: [facility];  Service: Urology;  Laterality: N/A;   OPERATIVE ULTRASOUND N/A 08/09/2020   Procedure: OPERATIVE ULTRASOUND;  Surgeon: Gery Pray, MD;  Location: [facility];  Service: Urology;  Laterality: N/A;   OPERATIVE ULTRASOUND N/A 08/17/2020   Procedure: OPERATIVE ULTRASOUND;  Surgeon: Gery Pray, MD;  Location: [facility];  Service: Urology;  Laterality: N/A;   OPERATIVE ULTRASOUND N/A 08/23/2020   Procedure: OPERATIVE ULTRASOUND;  Surgeon: Gery Pray, MD;  Location: [facility];  Service: Urology;  Laterality: N/A;   TANDEM RING INSERTION  08/06/2020   TANDEM RING INSERTION N/A 07/26/2020   Procedure: TANDEM RING INSERTION FOR HIGH DOSE RADIATION  THERAPY;  Surgeon: Gery Pray, MD;  Location: [facility];  Service: Urology;  Laterality: N/A;   TANDEM RING INSERTION N/A 08/06/2020   Procedure: TANDEM RING INSERTION;  Surgeon: Gery Pray, MD;  Location: [facility];  Service: Urology;  Laterality: N/A;   TANDEM RING INSERTION N/A 08/09/2020   Procedure: TANDEM RING  INSERTION;  Surgeon: Gery Pray, MD;  Location: [facility];  Service: Urology;  Laterality: N/A;   TANDEM RING INSERTION N/A 08/17/2020   Procedure: TANDEM RING INSERTION;  Surgeon: Gery Pray, MD;  Location: [facility];  Service: Urology;  Laterality: N/A;   TANDEM RING INSERTION N/A 08/23/2020   Procedure: TANDEM RING INSERTION;  Surgeon: Gery Pray, MD;  Location: [facility];  Service: Urology;  Laterality: N/A;   THERAPEUTIC ABORTION      SOCIAL HISTORY: Social History   Socioeconomic History   Marital status: Married    Spouse name: Dominica Severin   Number of children: 0   Years of education: Not on file   Highest education level: Not on file  Occupational History   Occupation: accountant  Tobacco Use   Smoking status: Former   Smokeless tobacco: Never   Tobacco comments:    quit 2019  Vaping Use   Vaping Use: Never used  Substance and Sexual Activity   Alcohol use: Yes    Alcohol/week: 1.0 standard drink of alcohol    Types: 1 Glasses of wine per week    Comment: occ   Drug use: Never   Sexual activity: Yes  Other Topics Concern   Not on file  Social History Narrative   Not on file   Social Determinants of Health   Financial Resource Strain: Not on file  Food Insecurity: Not on file  Transportation Needs: Not on file  Physical Activity: Not on file  Stress: Not on file  Social Connections: Not on file  Intimate Partner Violence: Not on file    FAMILY HISTORY: Family History  Problem Relation Age of Onset   Stroke Maternal Grandmother    Colon cancer Neg Hx    Breast cancer Neg Hx    Endometrial cancer Neg Hx    Ovarian cancer Neg Hx     ALLERGIES:  has No Known Allergies.  MEDICATIONS:  Current Outpatient Medications  Medication Sig Dispense Refill   dexamethasone (DECADRON) 4 MG tablet Take 2 tabs at the night before and 2 tab the morning of chemotherapy, every 3 weeks, by mouth x 6 cycles 36 tablet 6    HYDROmorphone (DILAUDID) 4 MG tablet Take 1 tablet (4 mg total) by mouth every 6 (six) hours as needed for severe pain. 60 tablet 0   acetaminophen (TYLENOL) 325 MG tablet Take 650 mg by mouth every 6 (six) hours as needed for moderate pain.      ALPRAZolam (XANAX) 0.5 MG tablet Take 0.5 mg by mouth at bedtime as needed for anxiety.     diphenhydramine-acetaminophen (TYLENOL PM) 25-500 MG TABS tablet Take 1 tablet by mouth at bedtime as needed (sleep).      escitalopram (LEXAPRO) 5 MG tablet Take 5 mg by mouth daily.     Ibuprofen 200 MG CAPS Take 200 mg by mouth as needed. 2 tabs prn     levothyroxine (SYNTHROID) 88 MCG tablet Take 88 mcg by mouth daily.     lidocaine-prilocaine (EMLA) cream Apply to affected area once 30 g 3   ondansetron (ZOFRAN) 8 MG tablet  Take 1 tablet (8 mg total) by mouth every 8 (eight) hours as needed for nausea or vomiting. Start on the third day after chemotherapy. 30 tablet 1   prochlorperazine (COMPAZINE) 10 MG tablet Take 1 tablet (10 mg total) by mouth every 6 (six) hours as needed for nausea or vomiting. 30 tablet 1   No current facility-administered medications for this visit.    REVIEW OF SYSTEMS:   Constitutional: Denies fevers, chills or abnormal night sweats Eyes: Denies blurriness of vision, double vision or watery eyes Ears, nose, mouth, throat, and face: Denies mucositis or sore throat Respiratory: Denies cough, dyspnea or wheezes Cardiovascular: Denies palpitation, chest discomfort or lower extremity swelling Skin: Denies abnormal skin rashes Lymphatics: Denies new lymphadenopathy or easy bruising Neurological:Denies numbness, tingling or new weaknesses Behavioral/Psych: Mood is stable, no new changes  All other systems were reviewed with the patient and are negative.  PHYSICAL EXAMINATION: ECOG PERFORMANCE STATUS: 1 - Symptomatic but completely ambulatory  Vitals:   09/28/22 1504  BP: (!) 163/99  Pulse: 100  Resp: 18  Temp: 97.9 F  (36.6 C)  SpO2: 99%   Filed Weights   09/28/22 1504  Weight: 202 lb (91.6 kg)    GENERAL:alert, no distress and comfortable NEURO: no focal motor/sensory deficits  LABORATORY DATA:  I have reviewed the data as listed Lab Results  Component Value Date   WBC 4.1 12/15/2020   HGB 13.5 12/15/2020   HCT 40.5 12/15/2020   MCV 96.4 12/15/2020   PLT 197 12/15/2020   No results for input(s): "NA", "K", "CL", "CO2", "GLUCOSE", "BUN", "CREATININE", "CALCIUM", "GFRNONAA", "GFRAA", "PROT", "ALBUMIN", "AST", "ALT", "ALKPHOS", "BILITOT", "BILIDIR", "IBILI" in the last 8760 hours.  RADIOGRAPHIC STUDIES: I have reviewed multiple imaging studies with the patient and her husband I have personally reviewed the radiological images as listed and agreed with the findings in the report. NM PET Image Restage (PS) Skull Base to Thigh (F-18 FDG)  Result Date: 09/26/2022 CLINICAL DATA:  Subsequent treatment strategy for uterine/cervical cancer. EXAM: NUCLEAR MEDICINE PET SKULL BASE TO THIGH TECHNIQUE: 10.3 mCi F-18 FDG was injected intravenously. Full-ring PET imaging was performed from the skull base to thigh after the radiotracer. CT data was obtained and used for attenuation correction and anatomic localization. Fasting blood glucose: 101 mg/dl COMPARISON:  Multiple priors including most recent PET-CT November 22, 2020. FINDINGS: Mediastinal blood pool activity: SUV max 2.3 Liver activity: SUV max NA NECK: Asymmetric hypermetabolic activity along the left glossotonsillar sulcus with a max SUV of 7.4. Hypermetabolic nodule in the right lobe of the thyroid measures 15 mm on image 47/4 with a max SUV of 18.1. Incidental CT findings: None. CHEST: Hypermetabolic right-sided pulmonary nodules. For instance a hypermetabolic right upper lobe pulmonary nodule measures 14 mm on image 9/7 with a max SUV of 7.9. Hypermetabolic pleural nodularity along the posterior paramedian right lower lobe measures 15 mm on image 30/7  with a max SUV of 10 Incidental CT findings: None. ABDOMEN/PELVIS: Ill-defined hypermetabolic masslike soft tissue extending from the posterior lower uterine segment/cervix into the left hemipelvis over to the pelvic sidewall measuring 10 x 5.8 cm on image 173/4 with a max SUV of 146. The masses appears to invade a portion of sigmoid colon in the urinary bladder with gas seen in the central portion of the mass likely reflecting a fistula. No abnormal hypermetabolic activity within the liver, pancreas, adrenal glands or spleen. No hypermetabolic pathologically enlarged abdominal or pelvic lymph nodes identified. Hypermetabolic nodular thickening of  the gastric antrum with a max SUV of 7.5. Incidental CT findings: Left hydroureteronephrosis to the level of the pelvic mass with associated cortical renal atrophy. SKELETON: Hypermetabolic osseous metastatic lesions.  For reference: -Expansile destructive hypermetabolic lesion centered at the L3-L4 vertebral body measures 3.5 x 3.0 cm on image 20/7 with a max SUV of 19.4 -expansile hypermetabolic right seventh rib lesion measures 6.2 x 4.0 cm on image 93/4 with a max SUV of 19.7. Incidental CT findings: None. IMPRESSION: 1. Ill-defined hypermetabolic aggressive masslike soft tissue extending from the posterior lower uterine segment/cervix into the left hemipelvis is consistent with recurrent/metastatic disease. The mass appears to invade a portion of the sigmoid colon and the urinary bladder with gas seen in the central portion of the mass likely reflecting a fistula. 2. Hypermetabolic right-sided pulmonary and pleural metastases. 3. Hypermetabolic osseous metastatic lesions centered at the L3-L4 vertebral body and right seventh rib. 4. Hypermetabolic nodular thickening of the gastric antrum, nonspecific consider correlation with upper endoscopy. 5. Asymmetric hypermetabolic activity along the left glossotonsillar sulcus is nonspecific consider correlation with direct  visualization 6. Hypermetabolic 15 mm right thyroid nodule, suggest further evaluation with dedicated thyroid ultrasound. 7. Left hydroureteronephrosis to the level of the pelvic mass with associated cortical renal atrophy. Electronically Signed   By: Dahlia Bailiff M.D.   On: 09/26/2022 12:56    ASSESSMENT & PLAN:  Cervical cancer ([place]) I have reviewed imaging studies with the patient Unfortunately, the patient have diffuse, recurrent metastatic disease I am most concerned about hydronephrosis and renal function We discussed plan of care She would need port placement and palliative chemotherapy We have requested PD-L1 testing to be added to her original tissue I recommend chemotherapy with combination of carboplatin, paclitaxel, Bevacizumab and to add pembrolizumab in the future if needed We discussed the risk of infection due to presence of fistula We reviewed the NCCN guidelines We discussed the role of chemotherapy. The intent is of palliative intent.  We discussed some of the risks, benefits, side-effects of carboplatin & Taxol & Avastin. Treatment is intravenous, every 3 weeks x 6 cycles, we will plan to repeat CT imaging after 3 cycles of therapy  Some of the short term side-effects included, though not limited to, including weight loss, hypertension, life threatening infections, risk of allergic reactions, need for transfusions of blood products, nausea, vomiting, change in bowel habits, loss of hair, admission to hospital for various reasons, and risks of death.   Long term side-effects are also discussed including risks of infertility, permanent damage to nerve function, hearing loss, chronic fatigue, kidney damage with possibility needing hemodialysis, and rare secondary malignancy including bone marrow disorders.  The patient is aware that the response rates discussed earlier is not guaranteed.  After a long discussion, patient made an informed decision to proceed with the prescribed  plan of care.   Patient education material was dispensed. We discussed premedication with dexamethasone before chemotherapy. I gave her prescription for cranial prosthesis We discussed the addition of Zometa after dental clearance We discussed the non-curative nature of her disease and I recommend the patient to apply for permanent disability   Metastasis to bone [facility]) We discussed the role of using IV Zometa We discussed the need for dental clearance before we can proceed with Zometa She is recommended to take calcium and vitamin D right now  Hydronephrosis of left kidney I am concerned about loss of kidney function I recommend blood work today to establish baseline renal function before we  proceed  Goals of care, counseling/discussion We discussed poor prognosis I recommend the patient to consider retirement/application for disability  Metastasis to lung [facility]) She is not symptomatic Observe for now  Orders Placed This Encounter  Procedures   IR IMAGING GUIDED PORT INSERTION    Standing Status:   Future    Standing Expiration Date:   09/29/2023    Order Specific Question:   Reason for Exam (SYMPTOM  OR DIAGNOSIS REQUIRED)    Answer:   ne4ed port for chemo    Order Specific Question:   Is the patient pregnant?    Answer:   No    Order Specific Question:   Preferred Imaging Location?    Answer:   [facility]   CBC with Differential ([place] Only)    Standing Status:   Future    Standing Expiration Date:   10/10/2023   CMP ([place] only)    Standing Status:   Future    Standing Expiration Date:   10/10/2023   CBC with Differential ([facility] Only)    Standing Status:   Future    Standing Expiration Date:   11/01/2023   CMP ([employer] only)    Standing Status:   Future    Standing Expiration Date:   11/01/2023   CBC with Differential ([place] Only)    Standing Status:   Future    Standing Expiration Date:   11/22/2023   CMP ([place] only)    Standing Status:   Future    Standing Expiration Date:   11/22/2023   CBC with Differential ([place] Only)    Standing Status:   Future    Standing Expiration Date:   12/13/2023   CMP ([place] only)    Standing Status:   Future    Standing Expiration Date:   12/13/2023   CBC with Differential ([place] Only)    Standing Status:   Future    Standing Expiration Date:   01/03/2024   CMP ([place] only)    Standing Status:   Future    Standing Expiration Date:   01/03/2024   CBC with Differential ([place] Only)    Standing Status:   Future    Standing Expiration Date:   01/24/2024   CMP ([place] only)    Standing Status:   Future    Standing Expiration Date:   01/24/2024   CBC with Differential ([place] Only)    Standing Status:   Future    Standing Expiration Date:   02/14/2024   CMP ([place] only)    Standing Status:   Future    Standing Expiration Date:   02/14/2024   CBC with Differential ([place] Only)    Standing Status:   Future    Standing Expiration Date:   03/06/2024   CMP ([place] only)    Standing Status:   Future    Standing Expiration Date:   03/06/2024    All questions were answered. The patient knows to call the clinic with any problems, questions or concerns. The total time spent in the appointment was 80 minutes encounter with patients including review of chart and various tests results, discussions about plan of care and coordination of care plan   Heath Lark, MD 09/28/2022 4:21 PM

## 2022-09-28 NOTE — Progress Notes (Signed)
DISCONTINUE OFF PATHWAY REGIMEN - Other   [id number]:Cisplatin 40 mg/m2 IV D1 q7 Days + RT:   A cycle is every 7 days:     Cisplatin   **Always confirm dose/schedule in your pharmacy ordering system**  REASON: Other Reason PRIOR TREATMENT: Cisplatin 40 mg/m2 IV D1 q7 Days + RT TREATMENT RESPONSE: Progressive Disease (PD)  START OFF PATHWAY REGIMEN - Other   [id number]:Bevacizumab 15 mg/kg IV D1 + Carboplatin AUC=5 IV D1 + Paclitaxel 175 mg/m2 IV D1 + Pembrolizumab 200 mg IV D1 q21 Days:   A cycle is every 21 days:     Bevacizumab-xxxx      Pembrolizumab      Paclitaxel      Carboplatin   **Always confirm dose/schedule in your pharmacy ordering system**  Patient Characteristics: Intent of Therapy: Non-Curative / Palliative Intent, Discussed with Patient

## 2022-09-28 NOTE — Assessment & Plan Note (Addendum)
I have reviewed imaging studies with the patient Unfortunately, the patient have diffuse, recurrent metastatic disease I am most concerned about hydronephrosis and renal function We discussed plan of care She would need port placement and palliative chemotherapy We have requested PD-L1 testing to be added to her original tissue I recommend chemotherapy with combination of carboplatin, paclitaxel, Bevacizumab and to add pembrolizumab in the future if needed We discussed the risk of infection due to presence of fistula We reviewed the NCCN guidelines We discussed the role of chemotherapy. The intent is of palliative intent.  We discussed some of the risks, benefits, side-effects of carboplatin & Taxol & Avastin. Treatment is intravenous, every 3 weeks x 6 cycles, we will plan to repeat CT imaging after 3 cycles of therapy  Some of the short term side-effects included, though not limited to, including weight loss, hypertension, life threatening infections, risk of allergic reactions, need for transfusions of blood products, nausea, vomiting, change in bowel habits, loss of hair, admission to hospital for various reasons, and risks of death.   Long term side-effects are also discussed including risks of infertility, permanent damage to nerve function, hearing loss, chronic fatigue, kidney damage with possibility needing hemodialysis, and rare secondary malignancy including bone marrow disorders.  The patient is aware that the response rates discussed earlier is not guaranteed.  After a long discussion, patient made an informed decision to proceed with the prescribed plan of care.   Patient education material was dispensed. We discussed premedication with dexamethasone before chemotherapy. I gave her prescription for cranial prosthesis We discussed the addition of Zometa after dental clearance We discussed the non-curative nature of her disease and I recommend the patient to apply for permanent  disability

## 2022-09-28 NOTE — Assessment & Plan Note (Signed)
We discussed poor prognosis I recommend the patient to consider retirement/application for disability

## 2022-09-28 NOTE — Assessment & Plan Note (Signed)
We discussed the role of using IV Zometa We discussed the need for dental clearance before we can proceed with Zometa She is recommended to take calcium and vitamin D right now

## 2022-09-28 NOTE — Research (Signed)
[employer] 2021-05 - Specimen Collection Study to Evaluate Biomarkers in Subjects with Cancer    [id number]: A RANDOMIZED TRIAL ADDRESSING CANCER-RELATED FINANCIAL HARDSHIP THROUGH DELIVERY OF A PROACTIVE FINANCIAL NAVIGATION INTERVENTION (CREDIT)   09/28/2022  CONSENT INTRO:  Patient Kelly Hanson was identified by Dr. Alvy Bimler as a potential candidate for the above listed studies.  This Clinical Research Coordinator met with Rexanne Inocencio, [id number], on 09/28/22 in a manner and location that ensures patient privacy to discuss participation in the above listed research study.  Patient is Accompanied by her husband .  A copy of the informed consent document with embedded HIPAA language for [employer] and with separate HIPAA language for [employer]CD was provided to the patient.  Patient reads, speaks, and understands [place].   Patient was provided with the business card of this Coordinator and encouraged to contact the research team with any questions.  Approximately 25 minutes were spent with the patient reviewing the informed consent documents.  Patient was provided the option of taking informed consent documents home to review and was encouraged to review at their convenience with their support network, including other care providers. Patient took the consent documents home to review.Informed patient we would reach out to her next week for potential interest or any questions/concerns.  Thanked patient for her time and consideration of the above mentioned study.   Carol Ada, RT(R)(T) Clinical Research Coordinator

## 2022-09-28 NOTE — Assessment & Plan Note (Signed)
She is not symptomatic. Observe for now 

## 2022-09-29 ENCOUNTER — Telehealth: Payer: Self-pay | Admitting: Hematology and Oncology

## 2022-09-29 ENCOUNTER — Other Ambulatory Visit: Payer: Self-pay

## 2022-09-29 NOTE — Telephone Encounter (Signed)
Scheduled appointments per WQ. Patient is aware. 

## 2022-09-30 ENCOUNTER — Other Ambulatory Visit: Payer: Self-pay

## 2022-10-02 ENCOUNTER — Telehealth: Payer: Self-pay | Admitting: Surgery

## 2022-10-02 ENCOUNTER — Ambulatory Visit (HOSPITAL_COMMUNITY)
Admission: RE | Admit: 2022-10-02 | Discharge: 2022-10-02 | Disposition: A | Discharge status: Home / Self Care | Payer: BC Managed Care – PPO | Source: Ambulatory Visit | Attending: Gynecologic Oncology | Admitting: Gynecologic Oncology

## 2022-10-02 DIAGNOSIS — R1909 Other intra-abdominal and pelvic swelling, mass and lump: Secondary | ICD-10-CM | POA: Diagnosis not present

## 2022-10-02 DIAGNOSIS — N1339 Other hydronephrosis: Secondary | ICD-10-CM | POA: Diagnosis not present

## 2022-10-02 DIAGNOSIS — D494 Neoplasm of unspecified behavior of bladder: Secondary | ICD-10-CM | POA: Diagnosis not present

## 2022-10-02 DIAGNOSIS — Z8541 Personal history of malignant neoplasm of cervix uteri: Secondary | ICD-10-CM | POA: Diagnosis not present

## 2022-10-02 DIAGNOSIS — C539 Malignant neoplasm of cervix uteri, unspecified: Secondary | ICD-10-CM | POA: Diagnosis not present

## 2022-10-02 DIAGNOSIS — M4317 Spondylolisthesis, lumbosacral region: Secondary | ICD-10-CM | POA: Diagnosis not present

## 2022-10-02 DIAGNOSIS — N1330 Unspecified hydronephrosis: Secondary | ICD-10-CM | POA: Diagnosis not present

## 2022-10-02 MED ORDER — FUROSEMIDE 10 MG/ML IJ SOLN
46.0000 mg | Freq: Once | INTRAMUSCULAR | Status: AC
  Administered 2022-10-02: 46 mg via INTRAVENOUS

## 2022-10-02 MED ORDER — FUROSEMIDE 10 MG/ML IJ SOLN
INTRAMUSCULAR | Status: AC
  Filled 2022-10-02: qty 8

## 2022-10-02 MED ORDER — TECHNETIUM TC 99M MERTIATIDE
5.3000 | Freq: Once | INTRAVENOUS | Status: AC
  Administered 2022-10-02: 5.3 via INTRAVENOUS

## 2022-10-02 NOTE — Progress Notes (Signed)
Pharmacist Chemotherapy Monitoring - Initial Assessment    Anticipated start date: 10/30   The following has been reviewed per standard work regarding the patient's treatment regimen: The patient's diagnosis, treatment plan and drug doses, and organ/hematologic function Lab orders and baseline tests specific to treatment regimen  The treatment plan start date, drug sequencing, and pre-medications Prior authorization status  Patient's documented medication list, including drug-drug interaction screen and prescriptions for anti-emetics and supportive care specific to the treatment regimen The drug concentrations, fluid compatibility, administration routes, and timing of the medications to be used The patient's access for treatment and lifetime cumulative dose history, if applicable  The patient's medication allergies and previous infusion related reactions, if applicable   Changes made to treatment plan:  N/A  Follow up needed:  Pending authorization for treatment    Larene Beach, [place], 10/02/2022  2:57 PM

## 2022-10-02 NOTE — Telephone Encounter (Signed)
Spoke with Kennyth Lose at [facility] to request update on PDL-1 testing. Request received by [facility] but not routed correctly. Per Kennyth Lose, this now corrected and request being processed. Will follow up as needed.

## 2022-10-03 ENCOUNTER — Other Ambulatory Visit: Payer: Self-pay | Admitting: *Deleted

## 2022-10-03 NOTE — Progress Notes (Signed)
The proposed treatment discussed in conference is for discussion purpose only and is not a binding recommendation.  The patients have not been physically examined, or presented with their treatment options.  Therefore, final treatment plans cannot be decided.  

## 2022-10-04 ENCOUNTER — Telehealth: Payer: Self-pay

## 2022-10-04 ENCOUNTER — Telehealth: Payer: Self-pay | Admitting: Gynecologic Oncology

## 2022-10-04 NOTE — Telephone Encounter (Signed)
Called patient at request of [facility] Gynecology office, as patient had questions related to treatment dates and times.  Patient informed RN that she would not be able to start treatment on 10/30, as patient will be out of town till 11/6. Patient requesting that all treatments and port placement be postponed till after that time. Patient made aware that this would affect all appointments going forward and may cause a delay in port placement prior to chemo start. Patient verbalized an understanding and wanted to proceed with rescheduling.  Scheduling message sent for patient to be r/s and Dr. Alvy Bimler aware.

## 2022-10-04 NOTE — Progress Notes (Signed)
The pharmacy team has substituted IV diphenhydramine for IV cetirizine as a premedication. Patient will be monitored for hypersensitivity reaction and adverse reactions to IV cetirizine. Thanks.   Kennith Center, Pharm.D., CPP 10/04/2022'@4'$ :49 PM

## 2022-10-04 NOTE — Telephone Encounter (Signed)
[facility] requested fax number and phone number to send records. Fax and phone numbers provided.

## 2022-10-04 NOTE — Telephone Encounter (Signed)
Per Dr Lulu Riding message patient had family emergency and could not do port placement appointment today.  Spoke with patient and had appointment rescheduled for 10/10/22.  Patient stated that she couldn't do that she needed it to be moved to around 11/6 or 11/7.  She also had questions about the chemo that Dr Alvy Bimler had spoke with her about.  Wanted to know why it was on 10/30 and again on 11/9 she stated she was told it would be every 21 days.  I explained to her I would speak with Dr Calton Dach nurse and have her to call her back.  I spoke with Junie Panning at [phone] and she stated she would speak with Dr Alvy Bimler and see what to do.  I called patient back and let her know that I would have Dr Calton Dach nurse contact her.  Patient confirmed and understood.

## 2022-10-04 NOTE — Telephone Encounter (Signed)
Called the patient regarding recent imaging. Perfusion scan shows almost no function of kidney - I do not recommend PCN given very little function left of this kidney. CT of her pelvis does not confirm a clear fistula with the colon. I do not think we need to divert her colon prior to initiating treatment.  Patient has a family emergency and needs to move her port appt. I will ask my office to help facilitate getting this rescheduled.  Jeral Pinch MD Gynecologic Oncology

## 2022-10-05 ENCOUNTER — Telehealth: Payer: Self-pay

## 2022-10-05 ENCOUNTER — Ambulatory Visit (HOSPITAL_COMMUNITY): Payer: BC Managed Care – PPO

## 2022-10-05 ENCOUNTER — Telehealth: Payer: Self-pay | Admitting: Hematology and Oncology

## 2022-10-05 ENCOUNTER — Other Ambulatory Visit (HOSPITAL_COMMUNITY): Payer: BC Managed Care – PPO

## 2022-10-05 ENCOUNTER — Other Ambulatory Visit: Payer: Self-pay | Admitting: Hematology and Oncology

## 2022-10-05 DIAGNOSIS — C539 Malignant neoplasm of cervix uteri, unspecified: Secondary | ICD-10-CM

## 2022-10-05 NOTE — Telephone Encounter (Signed)
Contacted patient to scheduled appointments. Patient is aware of appointments that are scheduled.   

## 2022-10-05 NOTE — Telephone Encounter (Signed)
I contacted Kennyth Lose at [facility] to follow up on the PDL-1 add on to specimen. She states she has not received a request for test add on and does not remember speaking to anyone from this office. Kennyth Lose also checked with Histology who handles add on requests([place] and [place]) and they have no documentation.   Per Kennyth Lose, fax request to Hystology department Attn: Angie fax# [phone] Phone# [phone]

## 2022-10-05 NOTE — Telephone Encounter (Signed)
Called to follow up regarding call yesterday regarding appt needing to be moved further out.  She will be out of town on Monday and does not want a virtual visit. She will be back in town on 11/7 and available for appts that day.

## 2022-10-05 NOTE — Telephone Encounter (Signed)
Called back and given below message. Port appt moved by earlier and she is aware. She verbalized understanding. She has already picked up Rx's

## 2022-10-05 NOTE — Telephone Encounter (Signed)
PLs try to get her port placed on 11/7 plus labs before or after port I will send LOS for Chelsea to move her treatment and appt to see me She needs to pick up dexamethasone pills that she has to take before she comes in for treatment,

## 2022-10-06 ENCOUNTER — Other Ambulatory Visit: Payer: Self-pay

## 2022-10-06 NOTE — Telephone Encounter (Signed)
Spoke with Angie from [facility] today. She states PDL-1 has been sent to [facility] for processing, and that they would contact us with results. [facility] phone number is [phone]. Will follow up with [facility] for results.

## 2022-10-07 ENCOUNTER — Other Ambulatory Visit: Payer: Self-pay

## 2022-10-09 ENCOUNTER — Ambulatory Visit: Payer: BC Managed Care – PPO

## 2022-10-09 ENCOUNTER — Other Ambulatory Visit: Payer: Self-pay

## 2022-10-09 ENCOUNTER — Other Ambulatory Visit: Payer: BC Managed Care – PPO

## 2022-10-09 ENCOUNTER — Ambulatory Visit: Payer: BC Managed Care – PPO | Admitting: Hematology and Oncology

## 2022-10-10 ENCOUNTER — Ambulatory Visit (HOSPITAL_COMMUNITY): Payer: BC Managed Care – PPO

## 2022-10-10 ENCOUNTER — Other Ambulatory Visit (HOSPITAL_COMMUNITY): Payer: BC Managed Care – PPO

## 2022-10-11 ENCOUNTER — Telehealth: Payer: Self-pay

## 2022-10-11 NOTE — Telephone Encounter (Signed)
Pt states she has been working around the house today and lifted a water bowl (>10lbs) she felt a pop and has started bleeding. She felt "something dripping". Initially it filled a pad but now has slowed down with minimal blood on pad. No pain/No cramping, no fever/chills. No vaginal discharge, no odor. She states she has port insertion on Tuesday 11/7 and first Chemo on 11/9. Pt wanting to know if she needs to come in today?

## 2022-10-11 NOTE — Telephone Encounter (Signed)
Pt is aware of Dr. Lulu Riding message. She states bleeding is no longer on pad, only light pink on toilet paper. She has been resting and will continue to today and tomorrow. She voices an understanding of calling if anything worsens.

## 2022-10-13 ENCOUNTER — Encounter: Payer: Self-pay | Admitting: Hematology and Oncology

## 2022-10-13 NOTE — Telephone Encounter (Signed)
Called [facility] to follow up on status of PDL-1 testing. Spoke with Janett Billow. Gave her the [facility]) account # [id number]. Per [facility] has received the request from [facility] but there was an issue; the number on the order did not match the number of specimens they received from [place]. There have been multiple correspondence between [facility] and [facility] regarding this issue, so it is being worked on. Janett Billow gave me reference # [id number] so our office can call back to follow up on the status of this in future. [facility] number is [phone].

## 2022-10-16 ENCOUNTER — Other Ambulatory Visit: Payer: Self-pay | Admitting: Student

## 2022-10-16 NOTE — H&P (Signed)
Chief Complaint: Patient was seen in consultation today for cervical cancer at the request of Gorsuch,Ni  Referring Physician(s): Heath Lark  Supervising Physician: Markus Daft  Patient Status: [facility] - Out-pt  History of Present Illness: Kelly Hanson is a 61 y.o. female diagnosed with invasive squamous cell carcinoma of the cervix after undergoing biopsy 05/03/20. She had port placed 06/03/20, underwent chemotherapy and then had port removed 12/15/20 after completion of treatment. Pt underwent cervical biopsy 05/24/22 that showed necroinflammatory debris. PET scan 09/26/22 resulted:  1. Ill-defined hypermetabolic aggressive masslike soft tissue extending from the posterior lower uterine segment/cervix into the left hemipelvis is consistent with recurrent/metastatic disease. The mass appears to invade a portion of the sigmoid colon and the urinary bladder with gas seen in the central portion of the mass likely reflecting a fistula. 2. Hypermetabolic right-sided pulmonary and pleural metastases. 3. Hypermetabolic osseous metastatic lesions centered at the L3-L4 vertebral body and right seventh rib. 4. Hypermetabolic nodular thickening of the gastric antrum, nonspecific consider correlation with upper endoscopy.  5. Asymmetric hypermetabolic activity along the left glossotonsillar sulcus is nonspecific consider correlation with direct visualization 6. Hypermetabolic 15 mm right thyroid nodule, suggest further evaluation with dedicated thyroid ultrasound. 7. Left hydroureteronephrosis to the level of the pelvic mass with associated cortical renal atrophy.  Pt has been referred to IR for port placement for palliative chemotherapy at the request of Ni Gorsuch, IR.   Pt denies CP, fever, chills or cough.  She endorses lower abdominal pain/tenderness, DOE, vaginal bleeding, nausea and back pain.  She is NPO per order.   Past Medical History:  Diagnosis Date   Anxiety    Cervical cancer [facility])     History of radiation therapy 06/07/2020-07/19/2020   Cervix IMRT ; Dr. Gery Pray   History of radiation therapy 08/23/2020   cervix HDR 07/26/2020-08/23/2020  Dr Gery Pray   Obesity (BMI 30-39.9)    Palpitations    Thyroid disease     Past Surgical History:  Procedure Laterality Date   IR IMAGING GUIDED PORT INSERTION  06/03/2020   IR REMOVAL TUN ACCESS W/ PORT W/O FL MOD SED  12/15/2020   OPERATIVE ULTRASOUND N/A 07/26/2020   Procedure: OPERATIVE ULTRASOUND;  Surgeon: Gery Pray, MD;  Location: [facility];  Service: Urology;  Laterality: N/A;   OPERATIVE ULTRASOUND N/A 08/06/2020   Procedure: OPERATIVE ULTRASOUND;  Surgeon: Gery Pray, MD;  Location: [facility];  Service: Urology;  Laterality: N/A;   OPERATIVE ULTRASOUND N/A 08/09/2020   Procedure: OPERATIVE ULTRASOUND;  Surgeon: Gery Pray, MD;  Location: [facility];  Service: Urology;  Laterality: N/A;   OPERATIVE ULTRASOUND N/A 08/17/2020   Procedure: OPERATIVE ULTRASOUND;  Surgeon: Gery Pray, MD;  Location: [facility];  Service: Urology;  Laterality: N/A;   OPERATIVE ULTRASOUND N/A 08/23/2020   Procedure: OPERATIVE ULTRASOUND;  Surgeon: Gery Pray, MD;  Location: [facility];  Service: Urology;  Laterality: N/A;   TANDEM RING INSERTION  08/06/2020   TANDEM RING INSERTION N/A 07/26/2020   Procedure: TANDEM RING INSERTION FOR HIGH DOSE RADIATION THERAPY;  Surgeon: Gery Pray, MD;  Location: [facility];  Service: Urology;  Laterality: N/A;   TANDEM RING INSERTION N/A 08/06/2020   Procedure: TANDEM RING INSERTION;  Surgeon: Gery Pray, MD;  Location: [facility];  Service: Urology;  Laterality: N/A;   TANDEM RING INSERTION N/A 08/09/2020   Procedure: TANDEM RING INSERTION;  Surgeon: Gery Pray, MD;  Location:  [place];  Service: Urology;  Laterality: N/A;   TANDEM RING INSERTION N/A 08/17/2020    Procedure: TANDEM RING INSERTION;  Surgeon: Gery Pray, MD;  Location: [facility];  Service: Urology;  Laterality: N/A;   TANDEM RING INSERTION N/A 08/23/2020   Procedure: TANDEM RING INSERTION;  Surgeon: Gery Pray, MD;  Location: [facility];  Service: Urology;  Laterality: N/A;   THERAPEUTIC ABORTION      Allergies: Patient has no known allergies.  Medications: Prior to Admission medications   Medication Sig Start Date End Date Taking? Authorizing Provider  acetaminophen (TYLENOL) 325 MG tablet Take 650 mg by mouth every 6 (six) hours as needed for moderate pain.     [provider]  ALPRAZolam Duanne Moron) 0.5 MG tablet Take 0.5 mg by mouth at bedtime as needed for anxiety.    [provider]  dexamethasone (DECADRON) 4 MG tablet Take 2 tabs the night before and 2 tab the morning of chemotherapy, every 3 weeks, by mouth x 6 cycles 09/28/22   Alvy Bimler, Ni, MD  diphenhydramine-acetaminophen (TYLENOL PM) 25-500 MG TABS tablet Take 1 tablet by mouth at bedtime as needed (sleep).     [provider]  escitalopram (LEXAPRO) 5 MG tablet Take 5 mg by mouth daily. 03/23/20   [provider]  HYDROmorphone (DILAUDID) 4 MG tablet Take 1 tablet (4 mg total) by mouth every 6 (six) hours as needed for severe pain. 09/28/22   Heath Lark, MD  Ibuprofen 200 MG CAPS Take 200 mg by mouth as needed. 2 tabs prn    [provider]  levothyroxine (SYNTHROID) 88 MCG tablet Take 88 mcg by mouth daily. 02/12/21   [provider]  lidocaine-prilocaine (EMLA) cream Apply to affected area once 09/28/22   Heath Lark, MD  ondansetron (ZOFRAN) 8 MG tablet Take 1 tablet (8 mg total) by mouth every 8 (eight) hours as needed for nausea or vomiting. Start on the third day after chemotherapy. 09/28/22   Heath Lark, MD  prochlorperazine (COMPAZINE) 10 MG tablet Take 1 tablet (10 mg total) by mouth every 6 (six) hours as needed for nausea or  vomiting. 09/28/22   Heath Lark, MD     Family History  Problem Relation Age of Onset   Stroke Maternal Grandmother    Colon cancer Neg Hx    Breast cancer Neg Hx    Endometrial cancer Neg Hx    Ovarian cancer Neg Hx     Social History   Socioeconomic History   Marital status: Married    Spouse name: Dominica Severin   Number of children: 0   Years of education: Not on file   Highest education level: Not on file  Occupational History   Occupation: accountant  Tobacco Use   Smoking status: Former   Smokeless tobacco: Never   Tobacco comments:    quit 2019  Vaping Use   Vaping Use: Never used  Substance and Sexual Activity   Alcohol use: Yes    Alcohol/week: 1.0 standard drink of alcohol    Types: 1 Glasses of wine per week    Comment: occ   Drug use: Never   Sexual activity: Yes  Other Topics Concern   Not on file  Social History Narrative   Not on file   Social Determinants of Health   Financial Resource Strain: Not on file  Food Insecurity: Not on file  Transportation Needs: Not on file  Physical Activity: Not on file  Stress: Not on file  Social Connections: Not on file    Review of Systems: A 12 point ROS discussed and pertinent positives are indicated in the HPI above.  All other systems are negative.  Review of Systems  Constitutional:  Negative for chills and fever.  Respiratory:  Positive for shortness of breath. Negative for cough.   Cardiovascular:  Negative for chest pain.  Gastrointestinal:  Positive for abdominal pain and nausea. Negative for vomiting.  Genitourinary:  Positive for vaginal bleeding.  Musculoskeletal:  Positive for back pain.    Vital Signs: BP (!) 146/109   Pulse (!) 104   Temp 98.4 F (36.9 C) (Oral)   Resp 18   SpO2 99%     Physical Exam Vitals reviewed.  Constitutional:      General: She is not in acute distress.    Appearance: Normal appearance. She is not ill-appearing.  HENT:     Head: Normocephalic and atraumatic.      Mouth/Throat:     Mouth: Mucous membranes are dry.     Pharynx: Oropharynx is clear.  Eyes:     Extraocular Movements: Extraocular movements intact.     Pupils: Pupils are equal, round, and reactive to light.  Cardiovascular:     Rate and Rhythm: Regular rhythm. Tachycardia present.     Pulses: Normal pulses.     Heart sounds: Normal heart sounds.  Pulmonary:     Effort: Pulmonary effort is normal. No respiratory distress.     Breath sounds: Normal breath sounds.  Abdominal:     General: There is no distension.     Palpations: Abdomen is soft.     Tenderness: There is abdominal tenderness. There is no guarding.  Musculoskeletal:     Right lower leg: No edema.     Left lower leg: No edema.  Skin:    General: Skin is warm and dry.  Neurological:     Mental Status: She is alert and oriented to person, place, and time.  Psychiatric:        Mood and Affect: Mood normal.        Behavior: Behavior normal.        Thought Content: Thought content normal.        Judgment: Judgment normal.     Imaging: CT PELVIS WO CONTRAST  Result Date: 10/04/2022 CLINICAL DATA:  Evaluate for colonic fistula. History of uterine/cervical cancer. * Tracking Code: BO * EXAM: CT PELVIS WITHOUT CONTRAST TECHNIQUE: Multidetector CT imaging of the pelvis was performed following the standard protocol without intravenous contrast. RADIATION DOSE REDUCTION: This exam was performed according to the departmental dose-optimization program which includes automated exposure control, adjustment of the mA and/or kV according to patient size and/or use of iterative reconstruction technique. COMPARISON:  PET-CT 09/25/2022 FINDINGS: Urinary Tract: There is tumor involvement involving the left posterior bladder at the level of the left UVJ. High attenuation material is identified within this area, image 36/2, which appears similar to the PET-CT from 09/25/2022. No patent fistula communication with the pelvic bowel loops  identified. Bowel: Rectal contrast material was administered in a retrograde fashion with opacification to at least the level of the mid descending colon. There is no contrast containing fistulous communication of the bowel loops with the bladder, uterus, or necrotic left pelvic mass. Vascular/Lymphatic: Aortic atherosclerosis. No adenopathy identified. Reproductive: Similar to recent PET-CT there is a large centrally necrotic mass involving the left side of uterus, left vaginal apex, left pelvic sidewall and left  posterolateral bladder. This measures approximately 7.7 by 5.7 cm, image 30/2. Similar to previous exam. The central portion of this mass appears necrotic containing gas and debris, image 30/2. No contrast containing patent fistula with the adjacent bowel loops noted. Other:  No ascites.  No peritoneal nodules. Musculoskeletal: Bilateral L5 pars defects identified with anterolisthesis of L5 on S1. no aggressive lytic or sclerotic bone lesions identified within the lower lumbar spine or pelvis. IMPRESSION: 1. No signs of patent fistulous communication of the colon with the necrotic left pelvic mass, urinary bladder or uterus. 2. Similar appearance of large centrally necrotic mass involving the left side of uterus, left vaginal apex, left pelvic sidewall and left posterolateral bladder. 3. High attenuation material is identified within the left posterior bladder near the UVJ which was present on the examination from 09/25/2022. 4. Bilateral L5 pars defects with anterolisthesis of L5 on S1. 5.  Aortic Atherosclerosis (ICD10-I70.0). Electronically Signed   By: Kerby Moors M.D.   On: 10/04/2022 07:39   NM Renal Imaging Flow W/Pharm  Result Date: 10/02/2022 CLINICAL DATA:  Cortical thinning on recent PET-CT, hydronephrosis, frequent UTIs, history of cervical cancer EXAM: NUCLEAR MEDICINE RENAL SCAN WITH DIURETIC ADMINISTRATION TECHNIQUE: Radionuclide angiographic and sequential renal images were obtained  after intravenous injection of radiopharmaceutical. Imaging was continued during slow intravenous injection of Lasix approximately 15 minutes after the start of the examination. RADIOPHARMACEUTICALS:  5.3 mCi Technetium-2mMAG3 IV Pharmaceutical: 46 mg Lasix IV COMPARISON:  PET-CT 09/25/2022 FINDINGS: Flow: Prompt arterial flow to RIGHT kidney. No appreciable LEFT kidney arterial flow identified. Left renogram: No uptake, concentration or excretion of tracer by LEFT kidney is seen Right renogram: Normal uptake, concentration and excretion of tracer by RIGHT kidney. Good clearance of tracer before and continuing following Lasix administration. No retained tracer at conclusion of exam. Analysis of renogram curve demonstrates normal time to peak activity of 4.7 minutes with fall to half maximum activity 9.2 minutes later Differential: Left kidney = 5%, essentially background Right kidney = 95% T1/2 post Lasix : Left kidney = insert not min Right kidney = 8.8 min IMPRESSION: Normal RIGHT renogram. No functional LEFT kidney/renal tissue identified. Electronically Signed   By: MLavonia DanaM.D.   On: 10/02/2022 15:56   NM PET Image Restage (PS) Skull Base to Thigh (F-18 FDG)  Result Date: 09/26/2022 CLINICAL DATA:  Subsequent treatment strategy for uterine/cervical cancer. EXAM: NUCLEAR MEDICINE PET SKULL BASE TO THIGH TECHNIQUE: 10.3 mCi F-18 FDG was injected intravenously. Full-ring PET imaging was performed from the skull base to thigh after the radiotracer. CT data was obtained and used for attenuation correction and anatomic localization. Fasting blood glucose: 101 mg/dl COMPARISON:  Multiple priors including most recent PET-CT November 22, 2020. FINDINGS: Mediastinal blood pool activity: SUV max 2.3 Liver activity: SUV max NA NECK: Asymmetric hypermetabolic activity along the left glossotonsillar sulcus with a max SUV of 7.4. Hypermetabolic nodule in the right lobe of the thyroid measures 15 mm on image 47/4  with a max SUV of 18.1. Incidental CT findings: None. CHEST: Hypermetabolic right-sided pulmonary nodules. For instance a hypermetabolic right upper lobe pulmonary nodule measures 14 mm on image 9/7 with a max SUV of 7.9. Hypermetabolic pleural nodularity along the posterior paramedian right lower lobe measures 15 mm on image 30/7 with a max SUV of 10 Incidental CT findings: None. ABDOMEN/PELVIS: Ill-defined hypermetabolic masslike soft tissue extending from the posterior lower uterine segment/cervix into the left hemipelvis over to the pelvic sidewall measuring 10 x  5.8 cm on image 173/4 with a max SUV of 146. The masses appears to invade a portion of sigmoid colon in the urinary bladder with gas seen in the central portion of the mass likely reflecting a fistula. No abnormal hypermetabolic activity within the liver, pancreas, adrenal glands or spleen. No hypermetabolic pathologically enlarged abdominal or pelvic lymph nodes identified. Hypermetabolic nodular thickening of the gastric antrum with a max SUV of 7.5. Incidental CT findings: Left hydroureteronephrosis to the level of the pelvic mass with associated cortical renal atrophy. SKELETON: Hypermetabolic osseous metastatic lesions.  For reference: -Expansile destructive hypermetabolic lesion centered at the L3-L4 vertebral body measures 3.5 x 3.0 cm on image 20/7 with a max SUV of 19.4 -expansile hypermetabolic right seventh rib lesion measures 6.2 x 4.0 cm on image 93/4 with a max SUV of 19.7. Incidental CT findings: None. IMPRESSION: 1. Ill-defined hypermetabolic aggressive masslike soft tissue extending from the posterior lower uterine segment/cervix into the left hemipelvis is consistent with recurrent/metastatic disease. The mass appears to invade a portion of the sigmoid colon and the urinary bladder with gas seen in the central portion of the mass likely reflecting a fistula. 2. Hypermetabolic right-sided pulmonary and pleural metastases. 3.  Hypermetabolic osseous metastatic lesions centered at the L3-L4 vertebral body and right seventh rib. 4. Hypermetabolic nodular thickening of the gastric antrum, nonspecific consider correlation with upper endoscopy. 5. Asymmetric hypermetabolic activity along the left glossotonsillar sulcus is nonspecific consider correlation with direct visualization 6. Hypermetabolic 15 mm right thyroid nodule, suggest further evaluation with dedicated thyroid ultrasound. 7. Left hydroureteronephrosis to the level of the pelvic mass with associated cortical renal atrophy. Electronically Signed   By: Dahlia Bailiff M.D.   On: 09/26/2022 12:56    Labs:  CBC: No results for input(s): "WBC", "HGB", "HCT", "PLT" in the last 8760 hours.  COAGS: No results for input(s): "INR", "APTT" in the last 8760 hours.  BMP: No results for input(s): "NA", "K", "CL", "CO2", "GLUCOSE", "BUN", "CALCIUM", "CREATININE", "GFRNONAA", "GFRAA" in the last 8760 hours.  Invalid input(s): "CMP"  LIVER FUNCTION TESTS: No results for input(s): "BILITOT", "AST", "ALT", "ALKPHOS", "PROT", "ALBUMIN" in the last 8760 hours.  TUMOR MARKERS: No results for input(s): "AFPTM", "CEA", "CA199", "CHROMGRNA" in the last 8760 hours.  Assessment and Plan:  61 yo female presents to IR for tunneled catheter with port placement.   Pt is A&O. She is tearful.  She is in no distress.   Risks and benefits of image guided tunneled catheter with port with moderate sedation placement was discussed with the patient including, but not limited to bleeding, infection, pneumothorax, or fibrin sheath development and need for additional procedures.  All of the patient's questions were answered, patient is agreeable to proceed. Consent signed and in chart.  Thank you for this interesting consult.  I greatly enjoyed meeting Kelly Hanson and look forward to participating in their care.  A copy of this report was sent to the requesting provider on this  date.  Electronically Signed: Tyson Alias, NP 10/17/2022, 1:30 PM   I spent a total of 20 minutes in face to face in clinical consultation, greater than 50% of which was counseling/coordinating care for cervical cancer.

## 2022-10-17 ENCOUNTER — Ambulatory Visit (HOSPITAL_COMMUNITY)
Admission: RE | Admit: 2022-10-17 | Discharge: 2022-10-17 | Disposition: A | Discharge status: Home / Self Care | Payer: BC Managed Care – PPO | Source: Ambulatory Visit | Attending: Hematology and Oncology | Admitting: Hematology and Oncology

## 2022-10-17 ENCOUNTER — Other Ambulatory Visit: Payer: Self-pay | Admitting: Hematology and Oncology

## 2022-10-17 ENCOUNTER — Telehealth: Payer: Self-pay

## 2022-10-17 ENCOUNTER — Other Ambulatory Visit (HOSPITAL_COMMUNITY): Payer: Self-pay

## 2022-10-17 ENCOUNTER — Encounter (HOSPITAL_COMMUNITY): Payer: Self-pay

## 2022-10-17 DIAGNOSIS — C530 Malignant neoplasm of endocervix: Secondary | ICD-10-CM

## 2022-10-17 DIAGNOSIS — C539 Malignant neoplasm of cervix uteri, unspecified: Secondary | ICD-10-CM | POA: Insufficient documentation

## 2022-10-17 DIAGNOSIS — Z452 Encounter for adjustment and management of vascular access device: Secondary | ICD-10-CM | POA: Diagnosis not present

## 2022-10-17 DIAGNOSIS — C760 Malignant neoplasm of head, face and neck: Secondary | ICD-10-CM | POA: Diagnosis not present

## 2022-10-17 HISTORY — PX: IR IMAGING GUIDED PORT INSERTION: IMG5740

## 2022-10-17 MED ORDER — DIPHENHYDRAMINE HCL 50 MG/ML IJ SOLN
INTRAMUSCULAR | Status: AC
  Filled 2022-10-17: qty 1

## 2022-10-17 MED ORDER — SODIUM CHLORIDE 0.9 % IV SOLN: INTRAVENOUS | Status: DC

## 2022-10-17 MED ORDER — FENTANYL CITRATE (PF) 100 MCG/2ML IJ SOLN
INTRAMUSCULAR | Status: AC
  Filled 2022-10-17: qty 4

## 2022-10-17 MED ORDER — DIPHENHYDRAMINE HCL 50 MG/ML IJ SOLN
INTRAMUSCULAR | Status: AC | PRN
  Administered 2022-10-17: 25 mg via INTRAVENOUS

## 2022-10-17 MED ORDER — MIDAZOLAM HCL 2 MG/2ML IJ SOLN
INTRAMUSCULAR | Status: AC | PRN
  Administered 2022-10-17 (×3): 1 mg via INTRAVENOUS

## 2022-10-17 MED ORDER — CEFAZOLIN SODIUM-DEXTROSE 2-4 GM/100ML-% IV SOLN
INTRAVENOUS | Status: AC
  Filled 2022-10-17: qty 100

## 2022-10-17 MED ORDER — CEFAZOLIN SODIUM-DEXTROSE 2-4 GM/100ML-% IV SOLN
INTRAVENOUS | Status: AC | PRN
  Administered 2022-10-17: 2 g via INTRAVENOUS

## 2022-10-17 MED ORDER — MIDAZOLAM HCL 2 MG/2ML IJ SOLN
INTRAMUSCULAR | Status: AC
  Filled 2022-10-17: qty 2

## 2022-10-17 MED ORDER — ONDANSETRON HCL 4 MG/2ML IJ SOLN
INTRAMUSCULAR | Status: AC
  Administered 2022-10-17: 8 mg via INTRAVENOUS
  Filled 2022-10-17: qty 2

## 2022-10-17 MED ORDER — LIDOCAINE HCL 1 % IJ SOLN
20.0000 mL | Freq: Once | INTRAMUSCULAR | Status: AC
  Administered 2022-10-17: 10 mL via INTRADERMAL

## 2022-10-17 MED ORDER — FENTANYL CITRATE (PF) 100 MCG/2ML IJ SOLN
INTRAMUSCULAR | Status: AC | PRN
  Administered 2022-10-17 (×3): 50 ug via INTRAVENOUS

## 2022-10-17 MED ORDER — HEPARIN SOD (PORK) LOCK FLUSH 100 UNIT/ML IV SOLN
INTRAVENOUS | Status: AC
  Administered 2022-10-17: 5 [IU]
  Filled 2022-10-17: qty 5

## 2022-10-17 MED ORDER — LIDOCAINE-EPINEPHRINE 1 %-1:100000 IJ SOLN
INTRAMUSCULAR | Status: AC
  Administered 2022-10-17: 20 mL via INTRADERMAL
  Filled 2022-10-17: qty 1

## 2022-10-17 MED ORDER — HYDROMORPHONE HCL 8 MG PO TABS
8.0000 mg | ORAL_TABLET | ORAL | 0 refills | Status: DC | PRN
  Filled 2022-10-17: qty 60, 10d supply, fill #0

## 2022-10-17 NOTE — Procedures (Signed)
Interventional Radiology Procedure:   Indications: Recurrent cervical cancer  Procedure: Port placement  Findings: Right jugular port, tip at SVC/RA junction  Complications: None     EBL: Minimal, less than 10 ml  Plan: Discharge in one hour.  Keep port site and incisions dry for at least 24 hours.     Liliauna Santoni R. Anselm Pancoast, MD  Pager: [phone]

## 2022-10-17 NOTE — Telephone Encounter (Signed)
I will send 8 mg dose to [facility] (tell her to take 1 instead of 2 since this is stronger) I will assess her pain when I see her Tell her to document her intake next 2 days

## 2022-10-17 NOTE — Telephone Encounter (Signed)
Returned her call. She is back in town now and scheduled for port placement today. Reviewed her up coming appts. Last bm yesterday. She is feeling a little constipated today. Instructed to take Miralax BID and Senokot 2 tabs TID as needed for constipation. She verbalized understanding.  She is complaining of decreased energy. She has a few Dilaudid pills left and needs a refill to [facility]. The Dilaudid Rx helps her abdominal side but does not help her back. She has to take 1 1/2 to 2 tabs at times to get pain relief.

## 2022-10-17 NOTE — Telephone Encounter (Signed)
Called and given below message. She verbalized understanding. She will document food intake and bring to next apt.

## 2022-10-17 NOTE — Discharge Instructions (Signed)
For questions /concerns may call Interventional Radiology at [phone] or  Interventional Radiology clinic [phone]   You may remove your dressing and shower tomorrow afternoon  DO NOT use EMLA cream for 2 weeks after port placement as the cream will remove surgical glue on your incision.    Implanted Port Insertion, Care After This sheet gives you information about how to care for yourself after your procedure. Your health care provider may also give you more specific instructions. If you have problems or questions, contact your health careprovider. What can I expect after the procedure? After the procedure, it is common to have: Discomfort at the port insertion site. Bruising on the skin over the port. This should improve over 3-4 days. Follow these instructions at home: Port care After your port is placed, you will get a manufacturer's information card. The card has information about your port. Keep this card with you at all times. Take care of the port as told by your health care provider. Ask your health care provider if you or a family member can get training for taking care of the port at home. A home health care nurse may also take care of the port. Make sure to remember what type of port you have. Incision care Follow instructions from your health care provider about how to take care of your port insertion site. Make sure you: Wash your hands with soap and water before and after you change your bandage (dressing). If soap and water are not available, use hand sanitizer. Change your dressing as told by your health care provider. Leave skin glue, or adhesive strips in place. These skin closures may need to stay in place for 2 weeks or longer.  Check your port insertion site every day for signs of infection. Check for:      - Redness, swelling, or pain.                     - Fluid or blood.      - Warmth.      - Pus or a bad smell. Activity Return to your normal activities as  told by your health care provider. Ask your health care provider what activities are safe for you. Do not lift anything that is heavier than 10 lb (4.5 kg), or the limit that you are told, until your health care provider says that it is safe. General instructions Take over-the-counter and prescription medicines only as told by your health care provider. Do not take baths, swim, or use a hot tub until your health care provider approves. Ask your health care provider if you may take showers. You may only be allowed to take sponge baths. Do not drive for 24 hours if you were given a sedative during your procedure. Wear a medical alert bracelet in case of an emergency. This will tell any health care providers that you have a port. Keep all follow-up visits as told by your health care provider. This is important. Contact a health care provider if: You cannot flush your port with saline as directed, or you cannot draw blood from the port. You have a fever or chills. You have redness, swelling, or pain around your port insertion site. You have fluid or blood coming from your port insertion site. Your port insertion site feels warm to the touch. You have pus or a bad smell coming from the port insertion site. Get help right away if: You have chest pain or shortness of   breath. You have bleeding from your port that you cannot control. Summary Take care of the port as told by your health care provider. Keep the manufacturer's information card with you at all times. Change your dressing as told by your health care provider. Contact a health care provider if you have a fever or chills or if you have redness, swelling, or pain around your port insertion site. Keep all follow-up visits as told by your health care provider. This information is not intended to replace advice given to you by your health care provider. Make sure you discuss any questions you have with your healthcare provider. Document Revised:  06/25/2018 Document Reviewed: 06/25/2018    Moderate Conscious Sedation, Adult, Care After This sheet gives you information about how to care for yourself after your procedure. Your health care provider may also give you more specific instructions. If you have problems or questions, contact your health careprovider. What can I expect after the procedure? After the procedure, it is common to have: Sleepiness for several hours. Impaired judgment for several hours. Difficulty with balance. Vomiting if you eat too soon. Follow these instructions at home: For the time period you were told by your health care provider: Rest. Do not participate in activities where you could fall or become injured. Do not drive or use machinery. Do not drink alcohol. Do not take sleeping pills or medicines that cause drowsiness. Do not make important decisions or sign legal documents. Do not take care of children on your own. Eating and drinking  Follow the diet recommended by your health care provider. Drink enough fluid to keep your urine pale yellow. If you vomit: Drink water, juice, or soup when you can drink without vomiting. Make sure you have little or no nausea before eating solid foods.  General instructions Take over-the-counter and prescription medicines only as told by your health care provider. Have a responsible adult stay with you for the time you are told. It is important to have someone help care for you until you are awake and alert. Do not smoke. Keep all follow-up visits as told by your health care provider. This is important. Contact a health care provider if: You are still sleepy or having trouble with balance after 24 hours. You feel light-headed. You keep feeling nauseous or you keep vomiting. You develop a rash. You have a fever. You have redness or swelling around the IV site. Get help right away if: You have trouble breathing. You have new-onset confusion at  home. Summary After the procedure, it is common to feel sleepy, have impaired judgment, or feel nauseous if you eat too soon. Rest after you get home. Know the things you should not do after the procedure. Follow the diet recommended by your health care provider and drink enough fluid to keep your urine pale yellow. Get help right away if you have trouble breathing or new-onset confusion at home. This information is not intended to replace advice given to you by your health care provider. Make sure you discuss any questions you have with your healthcare provider. Document Revised: 03/26/2020 Document Reviewed: 10/23/2019 [employer] Patient Education  2022 [employer]  

## 2022-10-18 ENCOUNTER — Telehealth: Payer: Self-pay | Admitting: Surgery

## 2022-10-18 MED FILL — Dexamethasone Sodium Phosphate Inj 100 MG/10ML: INTRAMUSCULAR | Qty: 1 | Status: AC

## 2022-10-18 MED FILL — Fosaprepitant Dimeglumine For IV Infusion 150 MG (Base Eq): INTRAVENOUS | Qty: 5 | Status: AC

## 2022-10-18 NOTE — Telephone Encounter (Signed)
Spoke with [facility] again to follow up on PDL-1 testing. Per [facility], they are looking for specimen source and need to change the test from PDL-1 with tumor proportion score to PDL-1 with combined positive score, as this is the test that is approved for cervical carcinoma. [facility] has sent this request to [place] today and their process is to follow up with [facility] after 2 days. Our office will follow up with [facility] regarding this as well.

## 2022-10-19 ENCOUNTER — Ambulatory Visit: Payer: BC Managed Care – PPO | Admitting: Hematology and Oncology

## 2022-10-19 ENCOUNTER — Other Ambulatory Visit: Payer: BC Managed Care – PPO

## 2022-10-19 ENCOUNTER — Inpatient Hospital Stay: Payer: BC Managed Care – PPO | Attending: Gynecologic Oncology

## 2022-10-19 ENCOUNTER — Inpatient Hospital Stay: Payer: BC Managed Care – PPO

## 2022-10-19 ENCOUNTER — Ambulatory Visit: Payer: BC Managed Care – PPO

## 2022-10-19 ENCOUNTER — Other Ambulatory Visit: Payer: Self-pay

## 2022-10-19 ENCOUNTER — Encounter: Payer: Self-pay | Admitting: Hematology and Oncology

## 2022-10-19 ENCOUNTER — Inpatient Hospital Stay: Payer: BC Managed Care – PPO | Admitting: Hematology and Oncology

## 2022-10-19 VITALS — BP 128/69 | HR 101 | Resp 18 | Ht 66.4 in | Wt 197.0 lb

## 2022-10-19 VITALS — BP 150/88 | HR 81 | Temp 97.9°F | Resp 18

## 2022-10-19 DIAGNOSIS — Z923 Personal history of irradiation: Secondary | ICD-10-CM | POA: Diagnosis not present

## 2022-10-19 DIAGNOSIS — Z79899 Other long term (current) drug therapy: Secondary | ICD-10-CM | POA: Insufficient documentation

## 2022-10-19 DIAGNOSIS — C7800 Secondary malignant neoplasm of unspecified lung: Secondary | ICD-10-CM | POA: Diagnosis not present

## 2022-10-19 DIAGNOSIS — C7951 Secondary malignant neoplasm of bone: Secondary | ICD-10-CM | POA: Insufficient documentation

## 2022-10-19 DIAGNOSIS — C539 Malignant neoplasm of cervix uteri, unspecified: Secondary | ICD-10-CM

## 2022-10-19 DIAGNOSIS — Z5111 Encounter for antineoplastic chemotherapy: Secondary | ICD-10-CM | POA: Insufficient documentation

## 2022-10-19 DIAGNOSIS — N1330 Unspecified hydronephrosis: Secondary | ICD-10-CM | POA: Insufficient documentation

## 2022-10-19 DIAGNOSIS — C782 Secondary malignant neoplasm of pleura: Secondary | ICD-10-CM | POA: Insufficient documentation

## 2022-10-19 DIAGNOSIS — K5909 Other constipation: Secondary | ICD-10-CM | POA: Diagnosis not present

## 2022-10-19 LAB — CMP (CANCER CENTER ONLY)
ALT: 6 U/L (ref 0–44)
AST: 10 U/L — ABNORMAL LOW (ref 15–41)
Albumin: 3.8 g/dL (ref 3.5–5.0)
Alkaline Phosphatase: 86 U/L (ref 38–126)
Anion gap: 11 (ref 5–15)
BUN: 17 mg/dL (ref 6–20)
CO2: 25 mmol/L (ref 22–32)
Calcium: 10 mg/dL (ref 8.9–10.3)
Chloride: 99 mmol/L (ref 98–111)
Creatinine: 1.2 mg/dL — ABNORMAL HIGH (ref 0.44–1.00)
GFR, Estimated: 52 mL/min — ABNORMAL LOW (ref >60)
Glucose, Bld: 195 mg/dL — ABNORMAL HIGH (ref 70–99)
Potassium: 3.6 mmol/L (ref 3.5–5.1)
Sodium: 135 mmol/L (ref 135–145)
Total Bilirubin: 0.4 mg/dL (ref 0.3–1.2)
Total Protein: 7.7 g/dL (ref 6.5–8.1)

## 2022-10-19 LAB — CBC WITH DIFFERENTIAL (CANCER CENTER ONLY)
Abs Immature Granulocytes: 0.04 10*3/uL (ref 0.00–0.07)
Basophils Absolute: 0 10*3/uL (ref 0.0–0.1)
Basophils Relative: 0 %
Eosinophils Absolute: 0 10*3/uL (ref 0.0–0.5)
Eosinophils Relative: 0 %
HCT: 35.3 % — ABNORMAL LOW (ref 36.0–46.0)
Hemoglobin: 11.4 g/dL — ABNORMAL LOW (ref 12.0–15.0)
Immature Granulocytes: 1 %
Lymphocytes Relative: 5 %
Lymphs Abs: 0.4 10*3/uL — ABNORMAL LOW (ref 0.7–4.0)
MCH: 28.1 pg (ref 26.0–34.0)
MCHC: 32.3 g/dL (ref 30.0–36.0)
MCV: 87.2 fL (ref 80.0–100.0)
Monocytes Absolute: 0.1 10*3/uL (ref 0.1–1.0)
Monocytes Relative: 1 %
Neutro Abs: 7.5 10*3/uL (ref 1.7–7.7)
Neutrophils Relative %: 93 %
Platelet Count: 519 10*3/uL — ABNORMAL HIGH (ref 150–400)
RBC: 4.05 MIL/uL (ref 3.87–5.11)
RDW: 15.5 % (ref 11.5–15.5)
WBC Count: 8 10*3/uL (ref 4.0–10.5)
nRBC: 0 % (ref 0.0–0.2)

## 2022-10-19 MED ORDER — SODIUM CHLORIDE 0.9 % IV SOLN: Freq: Once | INTRAVENOUS | Status: AC

## 2022-10-19 MED ORDER — SODIUM CHLORIDE 0.9 % IV SOLN
490.0000 mg | Freq: Once | INTRAVENOUS | Status: AC
  Administered 2022-10-19: 490 mg via INTRAVENOUS
  Filled 2022-10-19: qty 49

## 2022-10-19 MED ORDER — HEPARIN SOD (PORK) LOCK FLUSH 100 UNIT/ML IV SOLN
500.0000 [IU] | Freq: Once | INTRAVENOUS | Status: AC | PRN
  Administered 2022-10-19: 500 [IU]

## 2022-10-19 MED ORDER — SODIUM CHLORIDE 0.9 % IV SOLN
10.0000 mg | Freq: Once | INTRAVENOUS | Status: AC
  Administered 2022-10-19: 10 mg via INTRAVENOUS
  Filled 2022-10-19: qty 10

## 2022-10-19 MED ORDER — PALONOSETRON HCL INJECTION 0.25 MG/5ML
0.2500 mg | Freq: Once | INTRAVENOUS | Status: AC
  Administered 2022-10-19: 0.25 mg via INTRAVENOUS
  Filled 2022-10-19: qty 5

## 2022-10-19 MED ORDER — FAMOTIDINE IN NACL 20-0.9 MG/50ML-% IV SOLN
20.0000 mg | Freq: Once | INTRAVENOUS | Status: AC
  Administered 2022-10-19: 20 mg via INTRAVENOUS
  Filled 2022-10-19: qty 50

## 2022-10-19 MED ORDER — SODIUM CHLORIDE 0.9 % IV SOLN
175.0000 mg/m2 | Freq: Once | INTRAVENOUS | Status: AC
  Administered 2022-10-19: 360 mg via INTRAVENOUS
  Filled 2022-10-19: qty 60

## 2022-10-19 MED ORDER — SODIUM CHLORIDE 0.9% FLUSH
10.0000 mL | INTRAVENOUS | Status: DC | PRN
  Administered 2022-10-19: 10 mL

## 2022-10-19 MED ORDER — SODIUM CHLORIDE 0.9 % IV SOLN
150.0000 mg | Freq: Once | INTRAVENOUS | Status: AC
  Administered 2022-10-19: 150 mg via INTRAVENOUS
  Filled 2022-10-19: qty 150

## 2022-10-19 MED ORDER — CETIRIZINE HCL 10 MG/ML IV SOLN
10.0000 mg | Freq: Once | INTRAVENOUS | Status: AC
  Administered 2022-10-19: 10 mg via INTRAVENOUS
  Filled 2022-10-19: qty 1

## 2022-10-19 NOTE — Progress Notes (Signed)
[place] OFFICE PROGRESS NOTE  Patient Care Team: [name], MD as PCP - General (Family Medicine)  ASSESSMENT & PLAN:  Cervical cancer [facility]) Her pain is well controlled She will proceed with cycle 1 of treatment today without bevacizumab She was started cycle 2 of chemotherapy in 3 weeks with bevacizumab I recommend minimum 3 cycles of therapy before we repeat imaging study for objective assessment of response to therapy I reviewed side effects with the patient again  Hydronephrosis of left kidney Renal function is not profoundly abnormal Observe closely and I will adjust her treatment accordingly  Metastasis to bone [facility]) Her bone pain is well controlled We discussed narcotic refill policy and management of constipation  Other constipation We have extensive discussions about the role of laxative therapy during treatment and ongoing use of prescription of pain medicine  No orders of the defined types were placed in this encounter.   All questions were answered. The patient knows to call the clinic with any problems, questions or concerns. The total time spent in the appointment was 30 minutes encounter with patients including review of chart and various tests results, discussions about plan of care and coordination of care plan   Heath Lark, MD 10/19/2022 9:46 AM  INTERVAL HISTORY: Please see below for problem oriented charting. she returns for treatment follow-up with her husband She has completed port placement Her pain is well controlled She had recent constipation resolved with laxatives Denies other new symptoms The patient is applying for permanent disability PD-L1 testing is still pending  REVIEW OF SYSTEMS:   Constitutional: Denies fevers, chills or abnormal weight loss Eyes: Denies blurriness of vision Ears, nose, mouth, throat, and face: Denies mucositis or sore throat Respiratory: Denies cough, dyspnea or wheezes Cardiovascular:  Denies palpitation, chest discomfort or lower extremity swelling Gastrointestinal:  Denies nausea, heartburn or change in bowel habits Skin: Denies abnormal skin rashes Lymphatics: Denies new lymphadenopathy or easy bruising Neurological:Denies numbness, tingling or new weaknesses Behavioral/Psych: Mood is stable, no new changes  All other systems were reviewed with the patient and are negative.  I have reviewed the past medical history, past surgical history, social history and family history with the patient and they are unchanged from previous note.  ALLERGIES:  has No Known Allergies.  MEDICATIONS:  Current Outpatient Medications  Medication Sig Dispense Refill   acetaminophen (TYLENOL) 325 MG tablet Take 650 mg by mouth every 6 (six) hours as needed for moderate pain.      ALPRAZolam (XANAX) 0.5 MG tablet Take 0.5 mg by mouth at bedtime as needed for anxiety.     dexamethasone (DECADRON) 4 MG tablet Take 2 tabs the night before and 2 tab the morning of chemotherapy, every 3 weeks, by mouth x 6 cycles 36 tablet 6   diphenhydramine-acetaminophen (TYLENOL PM) 25-500 MG TABS tablet Take 1 tablet by mouth at bedtime as needed (sleep).      escitalopram (LEXAPRO) 5 MG tablet Take 5 mg by mouth daily.     HYDROmorphone (DILAUDID) 8 MG tablet Take 1 tablet (8 mg total) by mouth every 4 (four) hours as needed for severe pain. 60 tablet 0   Ibuprofen 200 MG CAPS Take 200 mg by mouth as needed. 2 tabs prn     levothyroxine (SYNTHROID) 88 MCG tablet Take 88 mcg by mouth daily.     lidocaine-prilocaine (EMLA) cream Apply to affected area once 30 g 3   ondansetron (ZOFRAN) 8 MG tablet Take 1 tablet (8  mg total) by mouth every 8 (eight) hours as needed for nausea or vomiting. Start on the third day after chemotherapy. 30 tablet 1   prochlorperazine (COMPAZINE) 10 MG tablet Take 1 tablet (10 mg total) by mouth every 6 (six) hours as needed for nausea or vomiting. 30 tablet 1   No current  facility-administered medications for this visit.   Facility-Administered Medications Ordered in Other Visits  Medication Dose Route Frequency Provider Last Rate Last Admin   CARBOplatin (PARAPLATIN) 490 mg in sodium chloride 0.9 % 250 mL chemo infusion  490 mg Intravenous Once Alvy Bimler, Ni, MD       cetirizine (QUZYTTIR) injection 10 mg  10 mg Intravenous Once Alvy Bimler, Ni, MD       famotidine (PEPCID) IVPB 20 mg premix  20 mg Intravenous Once Alvy Bimler, Ni, MD       fosaprepitant (EMEND) 150 mg in sodium chloride 0.9 % 145 mL IVPB  150 mg Intravenous Once Heath Lark, MD 450 mL/hr at 10/19/22 0935 150 mg at 10/19/22 0935   heparin lock flush 100 unit/mL  500 Units Intracatheter Once PRN Alvy Bimler, Ni, MD       PACLitaxel (TAXOL) 360 mg in sodium chloride 0.9 % 500 mL chemo infusion (> 58m/m2)  175 mg/m2 (Treatment Plan Recorded) Intravenous Once GAlvy Bimler Ni, MD       palonosetron (ALOXI) injection 0.25 mg  0.25 mg Intravenous Once Gorsuch, Ni, MD       sodium chloride flush (NS) 0.9 % injection 10 mL  10 mL Intracatheter PRN GAlvy Bimler Ni, MD        SUMMARY OF ONCOLOGIC HISTORY: Oncology History  Cervical cancer ([place]  02/09/2020 Initial Diagnosis   Between 6-8 months ago, the patient endorses having irregular spotting intermittnetly every few weeks that would last for a few days. This did not change over time and she describes it as light spotting. This was the first bleeding she had after menopause. She ultimately saw her PCP who performed a pap in March which returned showing HSIL. The patient tells me today that this is the first pap test she has had ever. She was then referred to gyn and underwent EMB/LEEP and transvaginal ultrasound for work-up of her PMB and high grade cervical dysplasia. Unfortunately, her pathology revealed SCC of the cervix   05/03/2020 Pathology Results   Endocervical curettings show fragments of squamous epithelium with high-grade squamous intraepithelial lesion, CIN-3 as  well as fragments of invasive squamous cell carcinoma within the endometrial biopsy.  The invasive squamous cell carcinoma have depth of invasion more than 3 mm, with at least 10 mm, thickness almost 1.2 mm.   05/18/2020 Imaging   1. No findings of metastatic disease to the abdomen/pelvis. 2. There is a small amount of gas either along the cervical canal or the adjacent fornix. A well-defined mass is not seen. 3. Chronic bilateral pars defects at L5 with 7 mm grade 1 anterolisthesis of L5 on S1 and resulting mild left foraminal stenosis. 4. Suspected right foraminal disc protrusion at L3-4 possibly causing mild right foraminal impingement.     05/21/2020 Cancer Staging   Staging form: Cervix Uteri, AJCC Version 9 - Clinical stage from 05/21/2020: FIGO Stage IVB (rcT4, cN0, cM1) - Signed by GHeath Lark MD on 09/28/2022 Stage prefix: Recurrence   05/26/2020 PET scan   1. Intensely hypermetabolic circumferential metabolic activity in the uterine cervix consistent with cervical carcinoma. 2. No evidence of metastatic disease in the pelvis. No metastatic lymphadenopathy. 3. No  evidence of distant metastatic disease. 4. Focus of metabolic activity in the gastric antrum would be unlikely location for a cervical carcinoma metastasis. Favor gastritis or potentially primary gastric neoplasm. Consider upper endoscopy for further evaluation. 5. Diffuse activity in the thyroid gland is favored thyroiditis.   06/03/2020 Procedure   Status post right IJ port catheter.   06/07/2020 - 07/07/2020 Chemotherapy   The patient had weekly cisplatin for chemotherapy treatment.     06/07/2020 - 08/23/2020 Radiation Therapy    Radiation Treatment Dates: 06/07/2020 through 07/19/2020 Site Technique Total Dose (Gy) Dose per Fx (Gy) Completed Fx Beam Energies  Cervix: Cervix IMRT 45/45 1.8 25/25 6X  Cervix: Cervix_Bst 3D 9/9 1.8 5/5 15X    HDR brachytherapy: 07/26/2020, 08/06/2020, 08/09/2020, 08/17/2020, 08/23/2020      11/22/2020 PET scan   Marked interval response to therapy with no visible mass at the site of previous ill-defined thickening of the cervix, and marked interval decrease in FDG uptake associated with this area as outlined above.   No signs of new disease in the neck, chest, abdomen or of the pelvis   Markedly hypermetabolic thyroid gland likely related to thyroiditis is minimally changed, correlate with thyroid function. Given asymmetric enlargement of the RIGHT thyroid lobe would also suggest thyroid ultrasound for further evaluation.     12/15/2020 Procedure   Removal of implanted Port-A-Cath utilizing sharp and blunt dissection. The procedure was uncomplicated.     05/24/2021 Pathology Results   Cervical biopsy showed necroinflammatory debris   09/26/2022 PET scan   1. Ill-defined hypermetabolic aggressive masslike soft tissue extending from the posterior lower uterine segment/cervix into the left hemipelvis is consistent with recurrent/metastatic disease. The mass appears to invade a portion of the sigmoid colon and the urinary bladder with gas seen in the central portion of the mass likely reflecting a fistula. 2. Hypermetabolic right-sided pulmonary and pleural metastases. 3. Hypermetabolic osseous metastatic lesions centered at the L3-L4 vertebral body and right seventh rib. 4. Hypermetabolic nodular thickening of the gastric antrum, nonspecific consider correlation with upper endoscopy.  5. Asymmetric hypermetabolic activity along the left glossotonsillar sulcus is nonspecific consider correlation with direct visualization 6. Hypermetabolic 15 mm right thyroid nodule, suggest further evaluation with dedicated thyroid ultrasound. 7. Left hydroureteronephrosis to the level of the pelvic mass with associated cortical renal atrophy.   10/03/2022 Imaging   CT pelvis 1. No signs of patent fistulous communication of the colon with the necrotic left pelvic mass, urinary bladder or  uterus. 2. Similar appearance of large centrally necrotic mass involving the left side of uterus, left vaginal apex, left pelvic sidewall and left posterolateral bladder. 3. High attenuation material is identified within the left posterior bladder near the UVJ which was present on the examination from 09/25/2022. 4. Bilateral L5 pars defects with anterolisthesis of L5 on S1. 5.  Aortic Atherosclerosis (ICD10-I70.0).   10/03/2022 Imaging   Renal perfusion study  Normal RIGHT renogram.   No functional LEFT kidney/renal tissue identified.   10/18/2022 Procedure   Placement of a subcutaneous power-injectable port device. Catheter tip at the superior cavoatrial junction.   10/19/2022 -  Chemotherapy   Patient is on Treatment Plan : Cervical cancer Carboplatin + Paclitaxel + Bevacizumab q21d        PHYSICAL EXAMINATION: ECOG PERFORMANCE STATUS: 1 - Symptomatic but completely ambulatory  Vitals:   10/19/22 0808  BP: 128/69  Pulse: (!) 101  Resp: 18  SpO2: 97%   Filed Weights   10/19/22 0808  Weight:  197 lb (89.4 kg)    GENERAL:alert, no distress and comfortable  NEURO: alert & oriented x 3 with fluent speech, no focal motor/sensory deficits  LABORATORY DATA:  I have reviewed the data as listed    Component Value Date/Time   NA 135 10/19/2022 0826   K 3.6 10/19/2022 0826   CL 99 10/19/2022 0826   CO2 25 10/19/2022 0826   GLUCOSE 195 (H) 10/19/2022 0826   BUN 17 10/19/2022 0826   CREATININE 1.20 (H) 10/19/2022 0826   CALCIUM 10.0 10/19/2022 0826   PROT 7.7 10/19/2022 0826   ALBUMIN 3.8 10/19/2022 0826   AST 10 (L) 10/19/2022 0826   ALT 6 10/19/2022 0826   ALKPHOS 86 10/19/2022 0826   BILITOT 0.4 10/19/2022 0826   GFRNONAA 52 (L) 10/19/2022 0826   GFRAA >60 07/05/2020 1202    No results found for: "SPEP", "UPEP"  Lab Results  Component Value Date   WBC 8.0 10/19/2022   NEUTROABS 7.5 10/19/2022   HGB 11.4 (L) 10/19/2022   HCT 35.3 (L) 10/19/2022   MCV 87.2  10/19/2022   PLT 519 (H) 10/19/2022      Chemistry      Component Value Date/Time   NA 135 10/19/2022 0826   K 3.6 10/19/2022 0826   CL 99 10/19/2022 0826   CO2 25 10/19/2022 0826   BUN 17 10/19/2022 0826   CREATININE 1.20 (H) 10/19/2022 0826      Component Value Date/Time   CALCIUM 10.0 10/19/2022 0826   ALKPHOS 86 10/19/2022 0826   AST 10 (L) 10/19/2022 0826   ALT 6 10/19/2022 0826   BILITOT 0.4 10/19/2022 0826       RADIOGRAPHIC STUDIES: I have personally reviewed the radiological images as listed and agreed with the findings in the report. IR IMAGING GUIDED PORT INSERTION  Result Date: 10/17/2022 INDICATION: Recurrent cervical cancer. EXAM: FLUOROSCOPIC AND ULTRASOUND GUIDED PLACEMENT OF A SUBCUTANEOUS PORT COMPARISON:  None Available. MEDICATIONS: Zofran 8 mg. Ancef 2 g ANESTHESIA/SEDATION: Moderate (conscious) sedation was employed during this procedure. A total of Versed 4 mg and fentanyl 200 mcg was administered intravenously at the order of the provider performing the procedure. Total intra-service moderate sedation time: 30 minutes. Patient's level of consciousness and vital signs were monitored continuously by radiology nurse throughout the procedure under the supervision of the provider performing the procedure. FLUOROSCOPY TIME:  Radiation Exposure Index (as provided by the fluoroscopic device): 1 mGy Kerma COMPLICATIONS: None immediate. PROCEDURE: The procedure, risks, benefits, and alternatives were explained to the patient. Questions regarding the procedure were encouraged and answered. The patient understands and consents to the procedure. Patient was placed supine on the interventional table. Ultrasound confirmed a patent right internal jugular vein. Ultrasound image was saved for documentation. The right chest and neck were cleaned with a skin antiseptic and a sterile drape was placed. Maximal barrier sterile technique was utilized including caps, mask, sterile gowns,  sterile gloves, sterile drape, hand hygiene and skin antiseptic. The right neck was anesthetized with 1% lidocaine. Small incision was made in the right neck with a blade. Micropuncture set was placed in the right internal jugular vein with ultrasound guidance. The micropuncture wire was used for measurement purposes. The right chest was anesthetized with 1% lidocaine with epinephrine. #15 blade was used to make an incision and a subcutaneous port pocket was formed. Hart was assembled. Subcutaneous tunnel was formed with a stiff tunneling device. The port catheter was brought through the subcutaneous tunnel.  The port was placed in the subcutaneous pocket. The micropuncture set was exchanged for a peel-away sheath. The catheter was placed through the peel-away sheath and the tip was positioned at the superior cavoatrial junction. Catheter placement was confirmed with fluoroscopy. The port was accessed and flushed with heparinized saline. The port pocket was closed using two layers of absorbable sutures and Dermabond. The vein skin site was closed using a single layer of absorbable suture and Dermabond. Sterile dressings were applied. Patient tolerated the procedure well without an immediate complication. Ultrasound and fluoroscopic images were taken and saved for this procedure. IMPRESSION: Placement of a subcutaneous power-injectable port device. Catheter tip at the superior cavoatrial junction. Electronically Signed   By: Markus Daft M.D.   On: 10/17/2022 16:47   CT PELVIS WO CONTRAST  Result Date: 10/04/2022 CLINICAL DATA:  Evaluate for colonic fistula. History of uterine/cervical cancer. * Tracking Code: BO * EXAM: CT PELVIS WITHOUT CONTRAST TECHNIQUE: Multidetector CT imaging of the pelvis was performed following the standard protocol without intravenous contrast. RADIATION DOSE REDUCTION: This exam was performed according to the departmental dose-optimization program which includes automated  exposure control, adjustment of the mA and/or kV according to patient size and/or use of iterative reconstruction technique. COMPARISON:  PET-CT 09/25/2022 FINDINGS: Urinary Tract: There is tumor involvement involving the left posterior bladder at the level of the left UVJ. High attenuation material is identified within this area, image 36/2, which appears similar to the PET-CT from 09/25/2022. No patent fistula communication with the pelvic bowel loops identified. Bowel: Rectal contrast material was administered in a retrograde fashion with opacification to at least the level of the mid descending colon. There is no contrast containing fistulous communication of the bowel loops with the bladder, uterus, or necrotic left pelvic mass. Vascular/Lymphatic: Aortic atherosclerosis. No adenopathy identified. Reproductive: Similar to recent PET-CT there is a large centrally necrotic mass involving the left side of uterus, left vaginal apex, left pelvic sidewall and left posterolateral bladder. This measures approximately 7.7 by 5.7 cm, image 30/2. Similar to previous exam. The central portion of this mass appears necrotic containing gas and debris, image 30/2. No contrast containing patent fistula with the adjacent bowel loops noted. Other:  No ascites.  No peritoneal nodules. Musculoskeletal: Bilateral L5 pars defects identified with anterolisthesis of L5 on S1. no aggressive lytic or sclerotic bone lesions identified within the lower lumbar spine or pelvis. IMPRESSION: 1. No signs of patent fistulous communication of the colon with the necrotic left pelvic mass, urinary bladder or uterus. 2. Similar appearance of large centrally necrotic mass involving the left side of uterus, left vaginal apex, left pelvic sidewall and left posterolateral bladder. 3. High attenuation material is identified within the left posterior bladder near the UVJ which was present on the examination from 09/25/2022. 4. Bilateral L5 pars defects  with anterolisthesis of L5 on S1. 5.  Aortic Atherosclerosis (ICD10-I70.0). Electronically Signed   By: Kerby Moors M.D.   On: 10/04/2022 07:39   NM Renal Imaging Flow W/Pharm  Result Date: 10/02/2022 CLINICAL DATA:  Cortical thinning on recent PET-CT, hydronephrosis, frequent UTIs, history of cervical cancer EXAM: NUCLEAR MEDICINE RENAL SCAN WITH DIURETIC ADMINISTRATION TECHNIQUE: Radionuclide angiographic and sequential renal images were obtained after intravenous injection of radiopharmaceutical. Imaging was continued during slow intravenous injection of Lasix approximately 15 minutes after the start of the examination. RADIOPHARMACEUTICALS:  5.3 mCi Technetium-11mMAG3 IV Pharmaceutical: 46 mg Lasix IV COMPARISON:  PET-CT 09/25/2022 FINDINGS: Flow: Prompt arterial flow to RIGHT  kidney. No appreciable LEFT kidney arterial flow identified. Left renogram: No uptake, concentration or excretion of tracer by LEFT kidney is seen Right renogram: Normal uptake, concentration and excretion of tracer by RIGHT kidney. Good clearance of tracer before and continuing following Lasix administration. No retained tracer at conclusion of exam. Analysis of renogram curve demonstrates normal time to peak activity of 4.7 minutes with fall to half maximum activity 9.2 minutes later Differential: Left kidney = 5%, essentially background Right kidney = 95% T1/2 post Lasix : Left kidney = insert not min Right kidney = 8.8 min IMPRESSION: Normal RIGHT renogram. No functional LEFT kidney/renal tissue identified. Electronically Signed   By: Lavonia Dana M.D.   On: 10/02/2022 15:56   NM PET Image Restage (PS) Skull Base to Thigh (F-18 FDG)  Result Date: 09/26/2022 CLINICAL DATA:  Subsequent treatment strategy for uterine/cervical cancer. EXAM: NUCLEAR MEDICINE PET SKULL BASE TO THIGH TECHNIQUE: 10.3 mCi F-18 FDG was injected intravenously. Full-ring PET imaging was performed from the skull base to thigh after the radiotracer. CT  data was obtained and used for attenuation correction and anatomic localization. Fasting blood glucose: 101 mg/dl COMPARISON:  Multiple priors including most recent PET-CT November 22, 2020. FINDINGS: Mediastinal blood pool activity: SUV max 2.3 Liver activity: SUV max NA NECK: Asymmetric hypermetabolic activity along the left glossotonsillar sulcus with a max SUV of 7.4. Hypermetabolic nodule in the right lobe of the thyroid measures 15 mm on image 47/4 with a max SUV of 18.1. Incidental CT findings: None. CHEST: Hypermetabolic right-sided pulmonary nodules. For instance a hypermetabolic right upper lobe pulmonary nodule measures 14 mm on image 9/7 with a max SUV of 7.9. Hypermetabolic pleural nodularity along the posterior paramedian right lower lobe measures 15 mm on image 30/7 with a max SUV of 10 Incidental CT findings: None. ABDOMEN/PELVIS: Ill-defined hypermetabolic masslike soft tissue extending from the posterior lower uterine segment/cervix into the left hemipelvis over to the pelvic sidewall measuring 10 x 5.8 cm on image 173/4 with a max SUV of 146. The masses appears to invade a portion of sigmoid colon in the urinary bladder with gas seen in the central portion of the mass likely reflecting a fistula. No abnormal hypermetabolic activity within the liver, pancreas, adrenal glands or spleen. No hypermetabolic pathologically enlarged abdominal or pelvic lymph nodes identified. Hypermetabolic nodular thickening of the gastric antrum with a max SUV of 7.5. Incidental CT findings: Left hydroureteronephrosis to the level of the pelvic mass with associated cortical renal atrophy. SKELETON: Hypermetabolic osseous metastatic lesions.  For reference: -Expansile destructive hypermetabolic lesion centered at the L3-L4 vertebral body measures 3.5 x 3.0 cm on image 20/7 with a max SUV of 19.4 -expansile hypermetabolic right seventh rib lesion measures 6.2 x 4.0 cm on image 93/4 with a max SUV of 19.7. Incidental CT  findings: None. IMPRESSION: 1. Ill-defined hypermetabolic aggressive masslike soft tissue extending from the posterior lower uterine segment/cervix into the left hemipelvis is consistent with recurrent/metastatic disease. The mass appears to invade a portion of the sigmoid colon and the urinary bladder with gas seen in the central portion of the mass likely reflecting a fistula. 2. Hypermetabolic right-sided pulmonary and pleural metastases. 3. Hypermetabolic osseous metastatic lesions centered at the L3-L4 vertebral body and right seventh rib. 4. Hypermetabolic nodular thickening of the gastric antrum, nonspecific consider correlation with upper endoscopy. 5. Asymmetric hypermetabolic activity along the left glossotonsillar sulcus is nonspecific consider correlation with direct visualization 6. Hypermetabolic 15 mm right thyroid nodule, suggest further  evaluation with dedicated thyroid ultrasound. 7. Left hydroureteronephrosis to the level of the pelvic mass with associated cortical renal atrophy. Electronically Signed   By: Dahlia Bailiff M.D.   On: 09/26/2022 12:56

## 2022-10-19 NOTE — Assessment & Plan Note (Signed)
Her bone pain is well controlled We discussed narcotic refill policy and management of constipation

## 2022-10-19 NOTE — Assessment & Plan Note (Signed)
We have extensive discussions about the role of laxative therapy during treatment and ongoing use of prescription of pain medicine

## 2022-10-19 NOTE — Patient Instructions (Signed)
[facility]  Discharge Instructions: Thank you for choosing [facility] to provide your oncology and hematology care.   If you have a lab appointment with the [facility], please go directly to the [facility] and check in at the registration area.   Wear comfortable clothing and clothing appropriate for easy access to any Portacath or PICC line.   We strive to give you quality time with your provider. You may need to reschedule your appointment if you arrive late (15 or more minutes).  Arriving late affects you and other patients whose appointments are after yours.  Also, if you miss three or more appointments without notifying the office, you may be dismissed from the clinic at the provider's discretion.      For prescription refill requests, have your pharmacy contact our office and allow 72 hours for refills to be completed.    Today you received the following chemotherapy and/or immunotherapy agents Paclitaxel (Taxol) and Carboplatin      To help prevent nausea and vomiting after your treatment, we encourage you to take your nausea medication as directed.  BELOW ARE SYMPTOMS THAT SHOULD BE REPORTED IMMEDIATELY: *FEVER GREATER THAN 100.4 F (38 C) OR HIGHER *CHILLS OR SWEATING *NAUSEA AND VOMITING THAT IS NOT CONTROLLED WITH YOUR NAUSEA MEDICATION *UNUSUAL SHORTNESS OF BREATH *UNUSUAL BRUISING OR BLEEDING *URINARY PROBLEMS (pain or burning when urinating, or frequent urination) *BOWEL PROBLEMS (unusual diarrhea, constipation, pain near the anus) TENDERNESS IN MOUTH AND THROAT WITH OR WITHOUT PRESENCE OF ULCERS (sore throat, sores in mouth, or a toothache) UNUSUAL RASH, SWELLING OR PAIN  UNUSUAL VAGINAL DISCHARGE OR ITCHING   Items with * indicate a potential emergency and should be followed up as soon as possible or go to the Emergency Department if any problems should occur.  Please show the CHEMOTHERAPY ALERT CARD or IMMUNOTHERAPY  ALERT CARD at check-in to the Emergency Department and triage nurse.  Should you have questions after your visit or need to cancel or reschedule your appointment, please contact [facility]  Dept: [phone]  and follow the prompts.  Office hours are 8:00 a.m. to 4:30 p.m. Monday - Friday. Please note that voicemails left after 4:00 p.m. may not be returned until the following business day.  We are closed weekends and major holidays. You have access to a nurse at all times for urgent questions. Please call the main number to the clinic Dept: [phone] and follow the prompts.   For any non-urgent questions, you may also contact your provider using MyChart. We now offer e-Visits for anyone 23 and older to request care online for non-urgent symptoms. For details visit [email or web address].   Also download the MyChart app! Go to the app store, search "MyChart", open the app, select [facility], and log in with your MyChart username and password.  Masks are optional in the cancer centers. If you would like for your care team to wear a mask while they are taking care of you, please let them know. You may have one support person who is at least 59 years old accompany you for your appointments. Paclitaxel (Taxol) Injection What is this medication? PACLITAXEL (PAK li TAX el) treats some types of cancer. It works by slowing down the growth of cancer cells. This medicine may be used for other purposes; ask your health care provider or pharmacist if you have questions. COMMON BRAND NAME(S): Onxol, Taxol What should I tell my care team before  I take this medication? They need to know if you have any of these conditions: Heart disease Liver disease Low white blood cell levels An unusual or allergic reaction to paclitaxel, other medications, foods, dyes, or preservatives If you or your partner are pregnant or trying to get pregnant Breast-feeding How should I use this  medication? This medication is injected into a vein. It is given by your care team in a hospital or clinic setting. Talk to your care team about the use of this medication in children. While it may be given to children for selected conditions, precautions do apply. Overdosage: If you think you have taken too much of this medicine contact a poison control center or emergency room at once. NOTE: This medicine is only for you. Do not share this medicine with others. What if I miss a dose? Keep appointments for follow-up doses. It is important not to miss your dose. Call your care team if you are unable to keep an appointment. What may interact with this medication? Do not take this medication with any of the following: Live virus vaccines Other medications may affect the way this medication works. Talk with your care team about all of the medications you take. They may suggest changes to your treatment plan to lower the risk of side effects and to make sure your medications work as intended. This list may not describe all possible interactions. Give your health care provider a list of all the medicines, herbs, non-prescription drugs, or dietary supplements you use. Also tell them if you smoke, drink alcohol, or use illegal drugs. Some items may interact with your medicine. What should I watch for while using this medication? Your condition will be monitored carefully while you are receiving this medication. You may need blood work while taking this medication. This medication may make you feel generally unwell. This is not uncommon as chemotherapy can affect healthy cells as well as cancer cells. Report any side effects. Continue your course of treatment even though you feel ill unless your care team tells you to stop. This medication can cause serious allergic reactions. To reduce the risk, your care team may give you other medications to take before receiving this one. Be sure to follow the directions  from your care team. This medication may increase your risk of getting an infection. Call your care team for advice if you get a fever, chills, sore throat, or other symptoms of a cold or flu. Do not treat yourself. Try to avoid being around people who are sick. This medication may increase your risk to bruise or bleed. Call your care team if you notice any unusual bleeding. Be careful brushing or flossing your teeth or using a toothpick because you may get an infection or bleed more easily. If you have any dental work done, tell your dentist you are receiving this medication. Talk to your care team if you may be pregnant. Serious birth defects can occur if you take this medication during pregnancy. Talk to your care team before breastfeeding. Changes to your treatment plan may be needed. What side effects may I notice from receiving this medication? Side effects that you should report to your care team as soon as possible: Allergic reactions--skin rash, itching, hives, swelling of the face, lips, tongue, or throat Heart rhythm changes--fast or irregular heartbeat, dizziness, feeling faint or lightheaded, chest pain, trouble breathing Increase in blood pressure Infection--fever, chills, cough, sore throat, wounds that don't heal, pain or trouble when passing urine,  general feeling of discomfort or being unwell Low blood pressure--dizziness, feeling faint or lightheaded, blurry vision Low red blood cell level--unusual weakness or fatigue, dizziness, headache, trouble breathing Painful swelling, warmth, or redness of the skin, blisters or sores at the infusion site Pain, tingling, or numbness in the hands or feet Slow heartbeat--dizziness, feeling faint or lightheaded, confusion, trouble breathing, unusual weakness or fatigue Unusual bruising or bleeding Side effects that usually do not require medical attention (report to your care team if they continue or are bothersome): Diarrhea Hair  loss Joint pain Loss of appetite Muscle pain Nausea Vomiting This list may not describe all possible side effects. Call your doctor for medical advice about side effects. You may report side effects to FDA at 1-800-FDA-1088. Where should I keep my medication? This medication is given in a hospital or clinic. It will not be stored at home. NOTE: This sheet is a summary. It may not cover all possible information. If you have questions about this medicine, talk to your doctor, pharmacist, or health care provider.  2023 Elsevier/Gold Standard (2022-03-29 00:00:00)  Carboplatin Injection What is this medication? CARBOPLATIN (KAR boe pla tin) treats some types of cancer. It works by slowing down the growth of cancer cells. This medicine may be used for other purposes; ask your health care provider or pharmacist if you have questions. COMMON BRAND NAME(S): Paraplatin What should I tell my care team before I take this medication? They need to know if you have any of these conditions: Blood disorders Hearing problems Kidney disease Recent or ongoing radiation therapy An unusual or allergic reaction to carboplatin, cisplatin, other medications, foods, dyes, or preservatives Pregnant or trying to get pregnant Breast-feeding How should I use this medication? This medication is injected into a vein. It is given by your care team in a hospital or clinic setting. Talk to your care team about the use of this medication in children. Special care may be needed. Overdosage: If you think you have taken too much of this medicine contact a poison control center or emergency room at once. NOTE: This medicine is only for you. Do not share this medicine with others. What if I miss a dose? Keep appointments for follow-up doses. It is important not to miss your dose. Call your care team if you are unable to keep an appointment. What may interact with this medication? Medications for seizures Some  antibiotics, such as amikacin, gentamicin, neomycin, streptomycin, tobramycin Vaccines This list may not describe all possible interactions. Give your health care provider a list of all the medicines, herbs, non-prescription drugs, or dietary supplements you use. Also tell them if you smoke, drink alcohol, or use illegal drugs. Some items may interact with your medicine. What should I watch for while using this medication? Your condition will be monitored carefully while you are receiving this medication. You may need blood work while taking this medication. This medication may make you feel generally unwell. This is not uncommon, as chemotherapy can affect healthy cells as well as cancer cells. Report any side effects. Continue your course of treatment even though you feel ill unless your care team tells you to stop. In some cases, you may be given additional medications to help with side effects. Follow all directions for their use. This medication may increase your risk of getting an infection. Call your care team for advice if you get a fever, chills, sore throat, or other symptoms of a cold or flu. Do not treat yourself. Try  to avoid being around people who are sick. Avoid taking medications that contain aspirin, acetaminophen, ibuprofen, naproxen, or ketoprofen unless instructed by your care team. These medications may hide a fever. Be careful brushing or flossing your teeth or using a toothpick because you may get an infection or bleed more easily. If you have any dental work done, tell your dentist you are receiving this medication. Talk to your care team if you wish to become pregnant or think you might be pregnant. This medication can cause serious birth defects. Talk to your care team about effective forms of contraception. Do not breast-feed while taking this medication. What side effects may I notice from receiving this medication? Side effects that you should report to your care team as  soon as possible: Allergic reactions--skin rash, itching, hives, swelling of the face, lips, tongue, or throat Infection--fever, chills, cough, sore throat, wounds that don't heal, pain or trouble when passing urine, general feeling of discomfort or being unwell Low red blood cell level--unusual weakness or fatigue, dizziness, headache, trouble breathing Pain, tingling, or numbness in the hands or feet, muscle weakness, change in vision, confusion or trouble speaking, loss of balance or coordination, trouble walking, seizures Unusual bruising or bleeding Side effects that usually do not require medical attention (report to your care team if they continue or are bothersome): Hair loss Nausea Unusual weakness or fatigue Vomiting This list may not describe all possible side effects. Call your doctor for medical advice about side effects. You may report side effects to FDA at 1-800-FDA-1088. Where should I keep my medication? This medication is given in a hospital or clinic. It will not be stored at home. NOTE: This sheet is a summary. It may not cover all possible information. If you have questions about this medicine, talk to your doctor, pharmacist, or health care provider.  2023 Elsevier/Gold Standard (2022-03-13 00:00:00)

## 2022-10-19 NOTE — Assessment & Plan Note (Signed)
Her pain is well controlled She will proceed with cycle 1 of treatment today without bevacizumab She was started cycle 2 of chemotherapy in 3 weeks with bevacizumab I recommend minimum 3 cycles of therapy before we repeat imaging study for objective assessment of response to therapy I reviewed side effects with the patient again

## 2022-10-19 NOTE — Assessment & Plan Note (Signed)
Renal function is not profoundly abnormal Observe closely and I will adjust her treatment accordingly

## 2022-10-23 ENCOUNTER — Encounter: Payer: Self-pay | Admitting: Hematology and Oncology

## 2022-10-23 NOTE — Telephone Encounter (Signed)
Called pt to see how she did with her recent treatment.  She reports back pain but Dr Alvy Bimler gave her pain medication.  She denies any other problems & knows her next appt & how to reach [place] if needed.

## 2022-10-23 NOTE — Telephone Encounter (Signed)
-----   Message from Ishmael Holter, RN sent at 10/19/2022  5:28 PM EST ----- Regarding: Dr. Alvy Bimler 1st tx f/u call Dr. Alvy Bimler 1st tx f/u call. # hr taxol Carbo. Tolerated well

## 2022-10-26 ENCOUNTER — Telehealth: Payer: Self-pay

## 2022-10-26 NOTE — Telephone Encounter (Signed)
Notified Patient of completion of FMLA and Disability Forms. Copies of forms mailed to Patient as requested. Forms sent to companies as requested and confirmations received. No other needs or concerns voiced at this time.

## 2022-10-27 ENCOUNTER — Ambulatory Visit: Payer: BC Managed Care – PPO

## 2022-10-27 ENCOUNTER — Ambulatory Visit: Payer: BC Managed Care – PPO | Admitting: Hematology and Oncology

## 2022-10-27 ENCOUNTER — Other Ambulatory Visit: Payer: BC Managed Care – PPO

## 2022-10-31 ENCOUNTER — Other Ambulatory Visit: Payer: Self-pay | Admitting: Hematology and Oncology

## 2022-10-31 ENCOUNTER — Ambulatory Visit: Payer: BC Managed Care – PPO

## 2022-10-31 ENCOUNTER — Ambulatory Visit: Payer: BC Managed Care – PPO | Admitting: Hematology and Oncology

## 2022-10-31 ENCOUNTER — Telehealth: Payer: Self-pay

## 2022-10-31 ENCOUNTER — Other Ambulatory Visit: Payer: BC Managed Care – PPO

## 2022-10-31 ENCOUNTER — Other Ambulatory Visit (HOSPITAL_COMMUNITY): Payer: Self-pay

## 2022-10-31 MED ORDER — HYDROMORPHONE HCL 8 MG PO TABS
8.0000 mg | ORAL_TABLET | ORAL | 0 refills | Status: DC | PRN
  Filled 2022-10-31: qty 60, 10d supply, fill #0

## 2022-10-31 NOTE — Telephone Encounter (Signed)
I sent refill to [facility]

## 2022-10-31 NOTE — Telephone Encounter (Signed)
She called and left a message requesting Dilaudid refill. She still has some but does not want to run out over the holiday.

## 2022-10-31 NOTE — Telephone Encounter (Signed)
Called back and told her Rx sent to pharmacy. She is complaining for about 3 days she has area on her spine that is hurting and maybe swollen. She is taking the pain medication as instructed. Denies injury or fall. She will alternate heat and ice pack to see if that helps and call the office back for questions/concerns.

## 2022-11-07 NOTE — Telephone Encounter (Signed)
Spoke with Ovid Curd at [facility].   Per Ovid Curd, [facility] did not respond to request to have PDL-1 testing order changed. No further contact received from [facility] until 11/17 when [facility] notified [facility] that no further testing was required for this patient and to store the sample. Per Ovid Curd, interactions with [facility] were from [facility]. Our office will follow up with [facility] regarding this.

## 2022-11-07 NOTE — Telephone Encounter (Signed)
Called [facility] and spoke with Maudie Mercury. PDL-1 results back from samples on 10/24 and 11/4. Received fax from [facility] with these results. Sent to be scanned into patient's chart.

## 2022-11-08 MED FILL — Dexamethasone Sodium Phosphate Inj 100 MG/10ML: INTRAMUSCULAR | Qty: 1 | Status: AC

## 2022-11-08 MED FILL — Fosaprepitant Dimeglumine For IV Infusion 150 MG (Base Eq): INTRAVENOUS | Qty: 5 | Status: AC

## 2022-11-09 ENCOUNTER — Inpatient Hospital Stay: Payer: BC Managed Care – PPO

## 2022-11-09 ENCOUNTER — Inpatient Hospital Stay: Payer: BC Managed Care – PPO | Admitting: Hematology and Oncology

## 2022-11-09 ENCOUNTER — Other Ambulatory Visit: Payer: Self-pay

## 2022-11-09 ENCOUNTER — Encounter: Payer: Self-pay | Admitting: Hematology and Oncology

## 2022-11-09 ENCOUNTER — Other Ambulatory Visit (HOSPITAL_COMMUNITY): Payer: Self-pay

## 2022-11-09 ENCOUNTER — Telehealth: Payer: Self-pay

## 2022-11-09 VITALS — BP 148/76 | HR 111 | Temp 97.6°F | Resp 18 | Ht 66.4 in | Wt 200.2 lb

## 2022-11-09 DIAGNOSIS — Z5111 Encounter for antineoplastic chemotherapy: Secondary | ICD-10-CM | POA: Diagnosis not present

## 2022-11-09 DIAGNOSIS — C539 Malignant neoplasm of cervix uteri, unspecified: Secondary | ICD-10-CM

## 2022-11-09 DIAGNOSIS — G893 Neoplasm related pain (acute) (chronic): Secondary | ICD-10-CM

## 2022-11-09 DIAGNOSIS — R350 Frequency of micturition: Secondary | ICD-10-CM

## 2022-11-09 DIAGNOSIS — K5909 Other constipation: Secondary | ICD-10-CM | POA: Diagnosis not present

## 2022-11-09 DIAGNOSIS — N1330 Unspecified hydronephrosis: Secondary | ICD-10-CM | POA: Diagnosis not present

## 2022-11-09 DIAGNOSIS — D6481 Anemia due to antineoplastic chemotherapy: Secondary | ICD-10-CM | POA: Diagnosis not present

## 2022-11-09 DIAGNOSIS — C7951 Secondary malignant neoplasm of bone: Secondary | ICD-10-CM | POA: Diagnosis not present

## 2022-11-09 DIAGNOSIS — C7800 Secondary malignant neoplasm of unspecified lung: Secondary | ICD-10-CM | POA: Diagnosis not present

## 2022-11-09 DIAGNOSIS — C782 Secondary malignant neoplasm of pleura: Secondary | ICD-10-CM | POA: Diagnosis not present

## 2022-11-09 DIAGNOSIS — T451X5A Adverse effect of antineoplastic and immunosuppressive drugs, initial encounter: Secondary | ICD-10-CM

## 2022-11-09 DIAGNOSIS — Z79899 Other long term (current) drug therapy: Secondary | ICD-10-CM | POA: Diagnosis not present

## 2022-11-09 DIAGNOSIS — Z923 Personal history of irradiation: Secondary | ICD-10-CM | POA: Diagnosis not present

## 2022-11-09 LAB — CBC WITH DIFFERENTIAL (CANCER CENTER ONLY)
Abs Immature Granulocytes: 0.06 10*3/uL (ref 0.00–0.07)
Basophils Absolute: 0 10*3/uL (ref 0.0–0.1)
Basophils Relative: 0 %
Eosinophils Absolute: 0 10*3/uL (ref 0.0–0.5)
Eosinophils Relative: 0 %
HCT: 31.3 % — ABNORMAL LOW (ref 36.0–46.0)
Hemoglobin: 9.9 g/dL — ABNORMAL LOW (ref 12.0–15.0)
Immature Granulocytes: 1 %
Lymphocytes Relative: 6 %
Lymphs Abs: 0.5 10*3/uL — ABNORMAL LOW (ref 0.7–4.0)
MCH: 28.4 pg (ref 26.0–34.0)
MCHC: 31.6 g/dL (ref 30.0–36.0)
MCV: 89.9 fL (ref 80.0–100.0)
Monocytes Absolute: 0.1 10*3/uL (ref 0.1–1.0)
Monocytes Relative: 1 %
Neutro Abs: 7.9 10*3/uL — ABNORMAL HIGH (ref 1.7–7.7)
Neutrophils Relative %: 92 %
Platelet Count: 505 10*3/uL — ABNORMAL HIGH (ref 150–400)
RBC: 3.48 MIL/uL — ABNORMAL LOW (ref 3.87–5.11)
RDW: 17.5 % — ABNORMAL HIGH (ref 11.5–15.5)
WBC Count: 8.6 10*3/uL (ref 4.0–10.5)
nRBC: 0 % (ref 0.0–0.2)

## 2022-11-09 LAB — URINALYSIS, COMPLETE (UACMP) WITH MICROSCOPIC
Bilirubin Urine: NEGATIVE
Glucose, UA: NEGATIVE mg/dL
Ketones, ur: NEGATIVE mg/dL
Nitrite: NEGATIVE
Protein, ur: 30 mg/dL — AB
RBC / HPF: >50 RBC/hpf — ABNORMAL HIGH (ref 0–5)
Specific Gravity, Urine: 1.01 (ref 1.005–1.030)
WBC, UA: >50 WBC/hpf — ABNORMAL HIGH (ref 0–5)
pH: 7 (ref 5.0–8.0)

## 2022-11-09 LAB — CMP (CANCER CENTER ONLY)
ALT: 5 U/L (ref 0–44)
AST: 8 U/L — ABNORMAL LOW (ref 15–41)
Albumin: 3.7 g/dL (ref 3.5–5.0)
Alkaline Phosphatase: 95 U/L (ref 38–126)
Anion gap: 8 (ref 5–15)
BUN: 15 mg/dL (ref 6–20)
CO2: 27 mmol/L (ref 22–32)
Calcium: 9.9 mg/dL (ref 8.9–10.3)
Chloride: 103 mmol/L (ref 98–111)
Creatinine: 0.95 mg/dL (ref 0.44–1.00)
GFR, Estimated: >60 mL/min (ref >60)
Glucose, Bld: 200 mg/dL — ABNORMAL HIGH (ref 70–99)
Potassium: 3.8 mmol/L (ref 3.5–5.1)
Sodium: 138 mmol/L (ref 135–145)
Total Bilirubin: 0.3 mg/dL (ref 0.3–1.2)
Total Protein: 7.4 g/dL (ref 6.5–8.1)

## 2022-11-09 LAB — TOTAL PROTEIN, URINE DIPSTICK: Protein, ur: 100 mg/dL — AB

## 2022-11-09 MED ORDER — SODIUM CHLORIDE 0.9% FLUSH
10.0000 mL | INTRAVENOUS | Status: DC | PRN
  Administered 2022-11-09: 10 mL

## 2022-11-09 MED ORDER — SODIUM CHLORIDE 0.9 % IV SOLN
150.0000 mg | Freq: Once | INTRAVENOUS | Status: AC
  Administered 2022-11-09: 150 mg via INTRAVENOUS
  Filled 2022-11-09: qty 150

## 2022-11-09 MED ORDER — SODIUM CHLORIDE 0.9 % IV SOLN
10.0000 mg | Freq: Once | INTRAVENOUS | Status: AC
  Administered 2022-11-09: 10 mg via INTRAVENOUS
  Filled 2022-11-09: qty 10

## 2022-11-09 MED ORDER — FAMOTIDINE IN NACL 20-0.9 MG/50ML-% IV SOLN
20.0000 mg | Freq: Once | INTRAVENOUS | Status: AC
  Administered 2022-11-09: 20 mg via INTRAVENOUS
  Filled 2022-11-09: qty 50

## 2022-11-09 MED ORDER — CYCLOBENZAPRINE HCL 5 MG PO TABS
5.0000 mg | ORAL_TABLET | Freq: Three times a day (TID) | ORAL | 0 refills | Status: DC | PRN
  Filled 2022-11-09: qty 30, 10d supply, fill #0

## 2022-11-09 MED ORDER — SODIUM CHLORIDE 0.9% FLUSH
10.0000 mL | Freq: Once | INTRAVENOUS | Status: AC
  Administered 2022-11-09: 10 mL

## 2022-11-09 MED ORDER — CETIRIZINE HCL 10 MG/ML IV SOLN
10.0000 mg | Freq: Once | INTRAVENOUS | Status: AC
  Administered 2022-11-09: 10 mg via INTRAVENOUS
  Filled 2022-11-09: qty 1

## 2022-11-09 MED ORDER — PALONOSETRON HCL INJECTION 0.25 MG/5ML
0.2500 mg | Freq: Once | INTRAVENOUS | Status: AC
  Administered 2022-11-09: 0.25 mg via INTRAVENOUS
  Filled 2022-11-09: qty 5

## 2022-11-09 MED ORDER — SODIUM CHLORIDE 0.9 % IV SOLN
175.0000 mg/m2 | Freq: Once | INTRAVENOUS | Status: AC
  Administered 2022-11-09: 360 mg via INTRAVENOUS
  Filled 2022-11-09: qty 60

## 2022-11-09 MED ORDER — SODIUM CHLORIDE 0.9 % IV SOLN
552.5000 mg | Freq: Once | INTRAVENOUS | Status: AC
  Administered 2022-11-09: 550 mg via INTRAVENOUS
  Filled 2022-11-09: qty 55

## 2022-11-09 MED ORDER — HEPARIN SOD (PORK) LOCK FLUSH 100 UNIT/ML IV SOLN
500.0000 [IU] | Freq: Once | INTRAVENOUS | Status: AC | PRN
  Administered 2022-11-09: 500 [IU]

## 2022-11-09 MED ORDER — SODIUM CHLORIDE 0.9 % IV SOLN: Freq: Once | INTRAVENOUS | Status: AC

## 2022-11-09 NOTE — Assessment & Plan Note (Signed)
Her husband felt that her pain is poorly controlled We discussed the role of adding additional long-acting pain medicine versus increasing the dose of her current Dilaudid There is a component of muscle spasm as well We also discussed other modalities such as palliative radiation treatment Ultimately, she would like to try a muscle relaxant She will continue on current dose of Dilaudid I reviewed side effects including excessive sedation and constipation with the patient

## 2022-11-09 NOTE — Assessment & Plan Note (Signed)
She has recent frequent urination and hematuria I will order urinalysis and urine culture to rule out infection This could be also the cause of her recent urinary incontinence

## 2022-11-09 NOTE — Patient Instructions (Signed)
[facility]  Discharge Instructions: Thank you for choosing [facility] to provide your oncology and hematology care.   If you have a lab appointment with the Cancer Center, please go directly to the Cancer Center and check in at the registration area.   Wear comfortable clothing and clothing appropriate for easy access to any Portacath or PICC line.   We strive to give you quality time with your provider. You may need to reschedule your appointment if you arrive late (15 or more minutes).  Arriving late affects you and other patients whose appointments are after yours.  Also, if you miss three or more appointments without notifying the office, you may be dismissed from the clinic at the provider's discretion.      For prescription refill requests, have your pharmacy contact our office and allow 72 hours for refills to be completed.    Today you received the following chemotherapy and/or immunotherapy agents: paclitaxel and carboplatin      To help prevent nausea and vomiting after your treatment, we encourage you to take your nausea medication as directed.  BELOW ARE SYMPTOMS THAT SHOULD BE REPORTED IMMEDIATELY: *FEVER GREATER THAN 100.4 F (38 C) OR HIGHER *CHILLS OR SWEATING *NAUSEA AND VOMITING THAT IS NOT CONTROLLED WITH YOUR NAUSEA MEDICATION *UNUSUAL SHORTNESS OF BREATH *UNUSUAL BRUISING OR BLEEDING *URINARY PROBLEMS (pain or burning when urinating, or frequent urination) *BOWEL PROBLEMS (unusual diarrhea, constipation, pain near the anus) TENDERNESS IN MOUTH AND THROAT WITH OR WITHOUT PRESENCE OF ULCERS (sore throat, sores in mouth, or a toothache) UNUSUAL RASH, SWELLING OR PAIN  UNUSUAL VAGINAL DISCHARGE OR ITCHING   Items with * indicate a potential emergency and should be followed up as soon as possible or go to the Emergency Department if any problems should occur.  Please show the CHEMOTHERAPY ALERT CARD or IMMUNOTHERAPY ALERT  CARD at check-in to the Emergency Department and triage nurse.  Should you have questions after your visit or need to cancel or reschedule your appointment, please contact [facility]  Dept: [phone]  and follow the prompts.  Office hours are 8:00 a.m. to 4:30 p.m. Monday - Friday. Please note that voicemails left after 4:00 p.m. may not be returned until the following business day.  We are closed weekends and major holidays. You have access to a nurse at all times for urgent questions. Please call the main number to the clinic Dept: [phone] and follow the prompts.   For any non-urgent questions, you may also contact your provider using MyChart. We now offer e-Visits for anyone 18 and older to request care online for non-urgent symptoms. For details visit [email or web address].   Also download the MyChart app! Go to the app store, search "MyChart", open the app, select , and log in with your MyChart username and password.  Masks are optional in the cancer centers. If you would like for your care team to wear a mask while they are taking care of you, please let them know. You may have one support person who is at least 59 years old accompany you for your appointments. 

## 2022-11-09 NOTE — Assessment & Plan Note (Signed)
The patient had multiple symptoms today which I have addressed separately We discussed timing of her imaging study It is too soon to evaluate for disease response Ultimately, we are in agreement to proceed with treatment as scheduled Due to evidence of heavy proteinuria, she is not ready to start bevacizumab yet

## 2022-11-09 NOTE — Progress Notes (Signed)
Per Dr Alvy Bimler, ok to treat today with elevated HR.

## 2022-11-09 NOTE — Telephone Encounter (Signed)
Vm left for Kelly Hanson  ([facility] [phone]) to call back regarding cancelled PDL-1 testing on specimen. (See 10/02/22 msg from [facility])

## 2022-11-09 NOTE — Progress Notes (Signed)
[place] OFFICE PROGRESS NOTE  Patient Care Team: [name], MD as PCP - General (Family Medicine)  ASSESSMENT & PLAN:  Cervical cancer [facility]) The patient had multiple symptoms today which I have addressed separately We discussed timing of her imaging study It is too soon to evaluate for disease response Ultimately, we are in agreement to proceed with treatment as scheduled Due to evidence of heavy proteinuria, she is not ready to start bevacizumab yet  Frequent urination She has recent frequent urination and hematuria I will order urinalysis and urine culture to rule out infection This could be also the cause of her recent urinary incontinence  Cancer associated pain Her husband felt that her pain is poorly controlled We discussed the role of adding additional long-acting pain medicine versus increasing the dose of her current Dilaudid There is a component of muscle spasm as well We also discussed other modalities such as palliative radiation treatment Ultimately, she would like to try a muscle relaxant She will continue on current dose of Dilaudid I reviewed side effects including excessive sedation and constipation with the patient  Anemia due to antineoplastic chemotherapy This is likely due to recent treatment. The patient denies recent history of bleeding such as epistaxis, hematuria or hematochezia. She is asymptomatic from the anemia. I will observe for now.  She does not require transfusion now. I will continue the chemotherapy at current dose without dosage adjustment.  If the anemia gets progressive worse in the future, I might have to delay her treatment or adjust the chemotherapy dose.   Orders Placed This Encounter  Procedures   Urine Culture    Standing Status:   Future    Number of Occurrences:   1    Standing Expiration Date:   11/10/2023   Urinalysis, Complete w Microscopic    Standing Status:   Future    Number of Occurrences:   1     Standing Expiration Date:   11/10/2023   Total Protein, Urine dipstick    Standing Status:   Future    Standing Expiration Date:   12/01/2023   Total Protein, Urine dipstick    Standing Status:   Future    Standing Expiration Date:   01/12/2024   Total Protein, Urine dipstick    Standing Status:   Future    Standing Expiration Date:   12/22/2023   Total Protein, Urine dipstick    Standing Status:   Future    Standing Expiration Date:   02/02/2024   Total Protein, Urine dipstick    Standing Status:   Future    Standing Expiration Date:   02/23/2024   Total Protein, Urine dipstick    Standing Status:   Future    Standing Expiration Date:   03/15/2024    All questions were answered. The patient knows to call the clinic with any problems, questions or concerns. The total time spent in the appointment was 40 minutes encounter with patients including review of chart and various tests results, discussions about plan of care and coordination of care plan   Heath Lark, MD 11/09/2022 12:54 PM  INTERVAL HISTORY: Please see below for problem oriented charting. she returns for treatment follow-up seen prior to cycle 2 of therapy The patient appears upset and tearful when I walked in This morning, she had 1 episode of urinary incontinence and she is embarrassed/upset about this She has frequent urination at nighttime She denies vaginal bleeding She has noticed some blood intermittently in  her urine Her husband thought her back pain is not well controlled On average, she takes 4-5 doses per day She felt that whenever she takes Dilaudid, her pain is manageable She has intermittent shaking on the right extremity but this is not present today She denies recent constipation no nausea She complains of fatigue and shortness of breath  REVIEW OF SYSTEMS:   Constitutional: Denies fevers, chills or abnormal weight loss Eyes: Denies blurriness of vision Ears, nose, mouth, throat, and face: Denies  mucositis or sore throat Cardiovascular: Denies palpitation, chest discomfort or lower extremity swelling Gastrointestinal:  Denies nausea, heartburn or change in bowel habits Skin: Denies abnormal skin rashes Lymphatics: Denies new lymphadenopathy or easy bruising Neurological:Denies numbness, tingling or new weaknesses All other systems were reviewed with the patient and are negative.  I have reviewed the past medical history, past surgical history, social history and family history with the patient and they are unchanged from previous note.  ALLERGIES:  has No Known Allergies.  MEDICATIONS:  Current Outpatient Medications  Medication Sig Dispense Refill   cyclobenzaprine (FLEXERIL) 5 MG tablet Take 1 tablet (5 mg total) by mouth 3 (three) times daily as needed for muscle spasms. 30 tablet 0   acetaminophen (TYLENOL) 325 MG tablet Take 650 mg by mouth every 6 (six) hours as needed for moderate pain.      ALPRAZolam (XANAX) 0.5 MG tablet Take 0.5 mg by mouth at bedtime as needed for anxiety.     dexamethasone (DECADRON) 4 MG tablet Take 2 tabs the night before and 2 tab the morning of chemotherapy, every 3 weeks, by mouth x 6 cycles 36 tablet 6   diphenhydramine-acetaminophen (TYLENOL PM) 25-500 MG TABS tablet Take 1 tablet by mouth at bedtime as needed (sleep).      escitalopram (LEXAPRO) 5 MG tablet Take 5 mg by mouth daily.     HYDROmorphone (DILAUDID) 8 MG tablet Take 1 tablet (8 mg total) by mouth every 4 (four) hours as needed for severe pain. 60 tablet 0   levothyroxine (SYNTHROID) 88 MCG tablet Take 88 mcg by mouth daily.     lidocaine-prilocaine (EMLA) cream Apply to affected area once 30 g 3   ondansetron (ZOFRAN) 8 MG tablet Take 1 tablet (8 mg total) by mouth every 8 (eight) hours as needed for nausea or vomiting. Start on the third day after chemotherapy. 30 tablet 1   prochlorperazine (COMPAZINE) 10 MG tablet Take 1 tablet (10 mg total) by mouth every 6 (six) hours as needed  for nausea or vomiting. 30 tablet 1   No current facility-administered medications for this visit.   Facility-Administered Medications Ordered in Other Visits  Medication Dose Route Frequency Provider Last Rate Last Admin   CARBOplatin (PARAPLATIN) 550 mg in sodium chloride 0.9 % 250 mL chemo infusion  550 mg Intravenous Once Alvy Bimler, Eldana Isip, MD       heparin lock flush 100 unit/mL  500 Units Intracatheter Once PRN Alvy Bimler, Jennessy Sandridge, MD       PACLitaxel (TAXOL) 360 mg in sodium chloride 0.9 % 500 mL chemo infusion (> '80mg'$ /m2)  175 mg/m2 (Treatment Plan Recorded) Intravenous Once Alvy Bimler, Khalaya Mcgurn, MD 187 mL/hr at 11/09/22 1147 360 mg at 11/09/22 1147   sodium chloride flush (NS) 0.9 % injection 10 mL  10 mL Intracatheter PRN Heath Lark, MD        SUMMARY OF ONCOLOGIC HISTORY: Oncology History  Cervical cancer ([place])  02/09/2020 Initial Diagnosis   Between 6-8 months ago, the patient  endorses having irregular spotting intermittnetly every few weeks that would last for a few days. This did not change over time and she describes it as light spotting. This was the first bleeding she had after menopause. She ultimately saw her PCP who performed a pap in March which returned showing HSIL. The patient tells me today that this is the first pap test she has had ever. She was then referred to gyn and underwent EMB/LEEP and transvaginal ultrasound for work-up of her PMB and high grade cervical dysplasia. Unfortunately, her pathology revealed SCC of the cervix   05/03/2020 Pathology Results   Endocervical curettings show fragments of squamous epithelium with high-grade squamous intraepithelial lesion, CIN-3 as well as fragments of invasive squamous cell carcinoma within the endometrial biopsy.  The invasive squamous cell carcinoma have depth of invasion more than 3 mm, with at least 10 mm, thickness almost 1.2 mm.   05/18/2020 Imaging   1. No findings of metastatic disease to the abdomen/pelvis. 2. There is a small amount of  gas either along the cervical canal or the adjacent fornix. A well-defined mass is not seen. 3. Chronic bilateral pars defects at L5 with 7 mm grade 1 anterolisthesis of L5 on S1 and resulting mild left foraminal stenosis. 4. Suspected right foraminal disc protrusion at L3-4 possibly causing mild right foraminal impingement.     05/21/2020 Cancer Staging   Staging form: Cervix Uteri, AJCC Version 9 - Clinical stage from 05/21/2020: FIGO Stage IVB (rcT4, cN0, cM1) - Signed by Heath Lark, MD on 09/28/2022 Stage prefix: Recurrence   05/26/2020 PET scan   1. Intensely hypermetabolic circumferential metabolic activity in the uterine cervix consistent with cervical carcinoma. 2. No evidence of metastatic disease in the pelvis. No metastatic lymphadenopathy. 3. No evidence of distant metastatic disease. 4. Focus of metabolic activity in the gastric antrum would be unlikely location for a cervical carcinoma metastasis. Favor gastritis or potentially primary gastric neoplasm. Consider upper endoscopy for further evaluation. 5. Diffuse activity in the thyroid gland is favored thyroiditis.   06/03/2020 Procedure   Status post right IJ port catheter.   06/07/2020 - 07/07/2020 Chemotherapy   The patient had weekly cisplatin for chemotherapy treatment.     06/07/2020 - 08/23/2020 Radiation Therapy    Radiation Treatment Dates: 06/07/2020 through 07/19/2020 Site Technique Total Dose (Gy) Dose per Fx (Gy) Completed Fx Beam Energies  Cervix: Cervix IMRT 45/45 1.8 25/25 6X  Cervix: Cervix_Bst 3D 9/9 1.8 5/5 15X    HDR brachytherapy: 07/26/2020, 08/06/2020, 08/09/2020, 08/17/2020, 08/23/2020     11/22/2020 PET scan   Marked interval response to therapy with no visible mass at the site of previous ill-defined thickening of the cervix, and marked interval decrease in FDG uptake associated with this area as outlined above.   No signs of new disease in the neck, chest, abdomen or of the pelvis   Markedly  hypermetabolic thyroid gland likely related to thyroiditis is minimally changed, correlate with thyroid function. Given asymmetric enlargement of the RIGHT thyroid lobe would also suggest thyroid ultrasound for further evaluation.     12/15/2020 Procedure   Removal of implanted Port-A-Cath utilizing sharp and blunt dissection. The procedure was uncomplicated.     05/24/2021 Pathology Results   Cervical biopsy showed necroinflammatory debris   09/26/2022 PET scan   1. Ill-defined hypermetabolic aggressive masslike soft tissue extending from the posterior lower uterine segment/cervix into the left hemipelvis is consistent with recurrent/metastatic disease. The mass appears to invade a portion of the  sigmoid colon and the urinary bladder with gas seen in the central portion of the mass likely reflecting a fistula. 2. Hypermetabolic right-sided pulmonary and pleural metastases. 3. Hypermetabolic osseous metastatic lesions centered at the L3-L4 vertebral body and right seventh rib. 4. Hypermetabolic nodular thickening of the gastric antrum, nonspecific consider correlation with upper endoscopy.  5. Asymmetric hypermetabolic activity along the left glossotonsillar sulcus is nonspecific consider correlation with direct visualization 6. Hypermetabolic 15 mm right thyroid nodule, suggest further evaluation with dedicated thyroid ultrasound. 7. Left hydroureteronephrosis to the level of the pelvic mass with associated cortical renal atrophy.   10/03/2022 Imaging   CT pelvis 1. No signs of patent fistulous communication of the colon with the necrotic left pelvic mass, urinary bladder or uterus. 2. Similar appearance of large centrally necrotic mass involving the left side of uterus, left vaginal apex, left pelvic sidewall and left posterolateral bladder. 3. High attenuation material is identified within the left posterior bladder near the UVJ which was present on the examination from 09/25/2022. 4.  Bilateral L5 pars defects with anterolisthesis of L5 on S1. 5.  Aortic Atherosclerosis (ICD10-I70.0).   10/03/2022 Imaging   Renal perfusion study  Normal RIGHT renogram.   No functional LEFT kidney/renal tissue identified.   10/18/2022 Procedure   Placement of a subcutaneous power-injectable port device. Catheter tip at the superior cavoatrial junction.   10/19/2022 -  Chemotherapy   Patient is on Treatment Plan : Cervical cancer Carboplatin + Paclitaxel + Bevacizumab q21d        PHYSICAL EXAMINATION: ECOG PERFORMANCE STATUS: 2 - Symptomatic, <50% confined to bed  Vitals:   11/09/22 0949  BP: (!) 148/76  Pulse: (!) 111  Resp: 18  Temp: 97.6 F (36.4 C)  SpO2: 96%   Filed Weights   11/09/22 0949  Weight: 200 lb 3.2 oz (90.8 kg)    GENERAL:alert, tearful but does not appear to be in distress SKIN: skin color, texture, turgor are normal, no rashes or significant lesions EYES: normal, Conjunctiva are pink and non-injected, sclera clear OROPHARYNX:no exudate, no erythema and lips, buccal mucosa, and tongue normal  NECK: supple, thyroid normal size, non-tender, without nodularity LYMPH:  no palpable lymphadenopathy in the cervical, axillary or inguinal LUNGS: clear to auscultation and percussion with normal breathing effort HEART: regular rate & rhythm and no murmurs and no lower extremity edema ABDOMEN:abdomen soft, non-tender and normal bowel sounds Musculoskeletal:no cyanosis of digits and no clubbing  NEURO: alert & oriented x 3 with fluent speech, no focal motor/sensory deficits  LABORATORY DATA:  I have reviewed the data as listed    Component Value Date/Time   NA 138 11/09/2022 0911   K 3.8 11/09/2022 0911   CL 103 11/09/2022 0911   CO2 27 11/09/2022 0911   GLUCOSE 200 (H) 11/09/2022 0911   BUN 15 11/09/2022 0911   CREATININE 0.95 11/09/2022 0911   CALCIUM 9.9 11/09/2022 0911   PROT 7.4 11/09/2022 0911   ALBUMIN 3.7 11/09/2022 0911   AST 8 (L) 11/09/2022  0911   ALT 5 11/09/2022 0911   ALKPHOS 95 11/09/2022 0911   BILITOT 0.3 11/09/2022 0911   GFRNONAA >60 11/09/2022 0911   GFRAA >60 07/05/2020 1202    No results found for: "SPEP", "UPEP"  Lab Results  Component Value Date   WBC 8.6 11/09/2022   NEUTROABS 7.9 (H) 11/09/2022   HGB 9.9 (L) 11/09/2022   HCT 31.3 (L) 11/09/2022   MCV 89.9 11/09/2022   PLT 505 (H) 11/09/2022  Chemistry      Component Value Date/Time   NA 138 11/09/2022 0911   K 3.8 11/09/2022 0911   CL 103 11/09/2022 0911   CO2 27 11/09/2022 0911   BUN 15 11/09/2022 0911   CREATININE 0.95 11/09/2022 0911      Component Value Date/Time   CALCIUM 9.9 11/09/2022 0911   ALKPHOS 95 11/09/2022 0911   AST 8 (L) 11/09/2022 0911   ALT 5 11/09/2022 0911   BILITOT 0.3 11/09/2022 0911

## 2022-11-09 NOTE — Assessment & Plan Note (Signed)

## 2022-11-10 ENCOUNTER — Telehealth: Payer: Self-pay | Admitting: Hematology and Oncology

## 2022-11-10 ENCOUNTER — Telehealth: Payer: Self-pay

## 2022-11-10 ENCOUNTER — Other Ambulatory Visit: Payer: Self-pay

## 2022-11-10 LAB — URINE CULTURE: Culture: NO GROWTH

## 2022-11-10 NOTE — Telephone Encounter (Signed)
-----   Message from Heath Lark, MD sent at 11/10/2022 11:29 AM EST ----- Urine culture showed no growth She was crying yesterday and in pain Can you call and check on her?

## 2022-11-10 NOTE — Telephone Encounter (Signed)
Scheduled appointment per WQ. Left voicemail. 

## 2022-11-10 NOTE — Telephone Encounter (Signed)
Called and given urine culture results. She verbalized understanding. She is feeling better today. Pain is better. She was upset at the office visit yesterday about having urinary leakage at home. The flexeril has helped so much and she has only used 2 x.

## 2022-11-11 ENCOUNTER — Other Ambulatory Visit: Payer: Self-pay

## 2022-11-13 ENCOUNTER — Encounter: Payer: Self-pay | Admitting: Hematology and Oncology

## 2022-11-13 ENCOUNTER — Telehealth: Payer: Self-pay | Admitting: Oncology

## 2022-11-13 ENCOUNTER — Other Ambulatory Visit: Payer: Self-pay

## 2022-11-13 NOTE — Telephone Encounter (Signed)
Left a message for Angie at [place] [phone]) regarding PD-L1 request status.  Requested a return call.

## 2022-11-13 NOTE — Telephone Encounter (Addendum)
Left a message for Kennyth Lose with pathology [phone]) at [facility] to request PDL-1 testing. Also faxed an urgent request to [phone].

## 2022-11-14 ENCOUNTER — Encounter: Payer: Self-pay | Admitting: Hematology and Oncology

## 2022-11-14 NOTE — Telephone Encounter (Signed)
Called Angie with [facility] regarding the PD-L1 request.  She said there was insufficient tissue on the block that was sent.  She is going to send the Le[place] block today which has more tissue and have the PD-L1 done again.

## 2022-11-15 ENCOUNTER — Other Ambulatory Visit: Payer: Self-pay | Admitting: Hematology and Oncology

## 2022-11-15 ENCOUNTER — Other Ambulatory Visit (HOSPITAL_COMMUNITY): Payer: Self-pay

## 2022-11-15 ENCOUNTER — Telehealth: Payer: Self-pay

## 2022-11-15 MED ORDER — MORPHINE SULFATE ER 30 MG PO TBCR
30.0000 mg | EXTENDED_RELEASE_TABLET | Freq: Two times a day (BID) | ORAL | 0 refills | Status: DC
  Filled 2022-11-15: qty 20, 10d supply, fill #0

## 2022-11-15 MED ORDER — HYDROMORPHONE HCL 8 MG PO TABS
8.0000 mg | ORAL_TABLET | ORAL | 0 refills | Status: DC | PRN
  Filled 2022-11-15: qty 60, 10d supply, fill #0

## 2022-11-15 NOTE — Telephone Encounter (Signed)
Please call the patient/husband back ASAP I have advised them in the past that it is very unlikely her local pharmacy would stock these strong pain medications They have to come and pick up at [facility] I will send her refill plus long acting pain medication to [facility] for her to try and will call her tomorrow morning to reassess

## 2022-11-15 NOTE — Telephone Encounter (Signed)
Called and s/w pt per MD. Made her aware of details stated per MD. She verbalized thanks and understanding.

## 2022-11-15 NOTE — Telephone Encounter (Signed)
Pt's husband called and states patient is experiencing worsened pain and is having to take her medication every 4 hrs for pain control. He states she only has 2 tabs left. She is not sleeping at night d/t urinary incontinence/urgency and this is a concern. Referred back to past notes and asked pt if she is taking her cyclobenzaprine and she states she is, but that it is not helping. Consulted with MD with concerns. She advised she would refill pain medication; if pain ia uncontrollable and pt is not sleeping, MD advised admission to inpt for pain control, or come to [facility] for MD visit 11/16/22 for work-in with .MD advised tumor is pressing on urinary bladder, and until tumor decreases in size, will continue to cause discomfort and urinary incontinence. Advised pt of these details. Pt declined appt and declined hospital admission, stating if she has her pain medications she will be fine, as her concern is mostly from the discomfort related to pain and tumor pressing on bladder. Pt requests refill to be sent to urgent Pharmacy in [place], [place], Message routed to MD.

## 2022-11-16 NOTE — Telephone Encounter (Signed)
Hi Kelly Hanson,  Can you call her today and check on her? We can do virtual visit later if needed Make sure she is taking MS contin on scheduled with dilaudid prn

## 2022-11-16 NOTE — Telephone Encounter (Signed)
Called to check up on Pt who verbalized that she feels "ok". Pt declined offer for virtual visit today stating that she would "just call if something came up".

## 2022-11-20 ENCOUNTER — Telehealth: Payer: Self-pay

## 2022-11-20 ENCOUNTER — Other Ambulatory Visit: Payer: Self-pay | Admitting: Hematology and Oncology

## 2022-11-20 DIAGNOSIS — E039 Hypothyroidism, unspecified: Secondary | ICD-10-CM

## 2022-11-20 MED ORDER — DEXAMETHASONE 4 MG PO TABS: ORAL_TABLET | ORAL | 6 refills | Status: DC

## 2022-11-20 MED ORDER — LEVOTHYROXINE SODIUM 88 MCG PO TABS: 88.0000 ug | ORAL_TABLET | Freq: Every day | ORAL | 1 refills | Status: DC

## 2022-11-20 MED ORDER — ESCITALOPRAM OXALATE 5 MG PO TABS: 5.0000 mg | ORAL_TABLET | Freq: Every day | ORAL | 1 refills | Status: DC

## 2022-11-20 NOTE — Telephone Encounter (Signed)
Called Pt to discuss below message. Pt verbalized understanding and appreciated the call.

## 2022-11-20 NOTE — Telephone Encounter (Signed)
Called Pt to discuss pain level. Per Pt pain is overall better with dilaudid Q 4 hrs. Pt states she is not taking MS Contin r/t stomach pain. Pt asked for [facility] and refills on Lexapro and Synthroid. Pt usually gets these meds from PCP Dr. [name]. Pt does not like to leave the house and is requesting these meds be ordered by Dr. Alvy Bimler.

## 2022-11-20 NOTE — Telephone Encounter (Signed)
-----   Message from Flo Shanks, RN sent at 11/20/2022  9:01 AM EST -----  ----- Message ----- From: Heath Lark, MD Sent: 11/20/2022   8:58 AM EST To: Flo Shanks, RN  Can you call and ask if her pain is better?

## 2022-11-20 NOTE — Telephone Encounter (Signed)
1) Remind her to call if her pain is not under controlled and when her prescription is running low, not when down to less than 5 pills 2) I refilled lexapro for 30 pills only; I want to discuss with her next visit to see if we need to increase the dose 3) I refilled her synthroid for 30 pills only. I do not have her TSH level and will order that in her next visit

## 2022-11-22 ENCOUNTER — Telehealth: Payer: Self-pay | Admitting: Oncology

## 2022-11-22 NOTE — Telephone Encounter (Signed)
Left a message for Angie at [place] regarding PD-L1 results.  Requested a return call.

## 2022-11-27 ENCOUNTER — Encounter: Payer: Self-pay | Admitting: Hematology and Oncology

## 2022-11-28 ENCOUNTER — Telehealth: Payer: Self-pay

## 2022-11-28 ENCOUNTER — Other Ambulatory Visit: Payer: Self-pay | Admitting: Hematology and Oncology

## 2022-11-28 ENCOUNTER — Other Ambulatory Visit (HOSPITAL_COMMUNITY): Payer: Self-pay

## 2022-11-28 MED ORDER — HYDROMORPHONE HCL 8 MG PO TABS
8.0000 mg | ORAL_TABLET | ORAL | 0 refills | Status: DC | PRN
  Filled 2022-11-28: qty 90, 15d supply, fill #0

## 2022-11-28 NOTE — Telephone Encounter (Signed)
Done

## 2022-11-28 NOTE — Telephone Encounter (Signed)
She called and requested Dilaudid refill to [facility]. She will pick it up at next appt on 12/21.

## 2022-11-29 MED FILL — Fosaprepitant Dimeglumine For IV Infusion 150 MG (Base Eq): INTRAVENOUS | Qty: 5 | Status: AC

## 2022-11-29 MED FILL — Dexamethasone Sodium Phosphate Inj 100 MG/10ML: INTRAMUSCULAR | Qty: 1 | Status: AC

## 2022-11-29 NOTE — Telephone Encounter (Signed)
Left another message for Angie at [place] regarding PD-L1 results.

## 2022-11-30 ENCOUNTER — Other Ambulatory Visit: Payer: Self-pay | Admitting: *Deleted

## 2022-11-30 ENCOUNTER — Other Ambulatory Visit: Payer: Self-pay

## 2022-11-30 ENCOUNTER — Inpatient Hospital Stay: Payer: BC Managed Care – PPO | Attending: Gynecologic Oncology

## 2022-11-30 ENCOUNTER — Other Ambulatory Visit: Payer: Self-pay | Admitting: Hematology and Oncology

## 2022-11-30 ENCOUNTER — Telehealth: Payer: Self-pay | Admitting: Hematology and Oncology

## 2022-11-30 ENCOUNTER — Encounter: Payer: Self-pay | Admitting: Radiology

## 2022-11-30 ENCOUNTER — Inpatient Hospital Stay: Payer: BC Managed Care – PPO

## 2022-11-30 ENCOUNTER — Encounter: Payer: Self-pay | Admitting: Hematology and Oncology

## 2022-11-30 ENCOUNTER — Inpatient Hospital Stay: Payer: BC Managed Care – PPO | Admitting: Hematology and Oncology

## 2022-11-30 VITALS — BP 138/75 | HR 108 | Temp 98.4°F | Resp 18 | Ht 66.4 in | Wt 189.6 lb

## 2022-11-30 VITALS — HR 97

## 2022-11-30 DIAGNOSIS — C7951 Secondary malignant neoplasm of bone: Secondary | ICD-10-CM | POA: Insufficient documentation

## 2022-11-30 DIAGNOSIS — G893 Neoplasm related pain (acute) (chronic): Secondary | ICD-10-CM | POA: Insufficient documentation

## 2022-11-30 DIAGNOSIS — C7800 Secondary malignant neoplasm of unspecified lung: Secondary | ICD-10-CM | POA: Diagnosis not present

## 2022-11-30 DIAGNOSIS — C539 Malignant neoplasm of cervix uteri, unspecified: Secondary | ICD-10-CM | POA: Diagnosis not present

## 2022-11-30 DIAGNOSIS — Z5111 Encounter for antineoplastic chemotherapy: Secondary | ICD-10-CM | POA: Diagnosis not present

## 2022-11-30 DIAGNOSIS — R634 Abnormal weight loss: Secondary | ICD-10-CM

## 2022-11-30 DIAGNOSIS — R5381 Other malaise: Secondary | ICD-10-CM | POA: Diagnosis not present

## 2022-11-30 DIAGNOSIS — Z79899 Other long term (current) drug therapy: Secondary | ICD-10-CM | POA: Diagnosis not present

## 2022-11-30 DIAGNOSIS — R809 Proteinuria, unspecified: Secondary | ICD-10-CM | POA: Diagnosis not present

## 2022-11-30 DIAGNOSIS — E039 Hypothyroidism, unspecified: Secondary | ICD-10-CM

## 2022-11-30 LAB — CMP (CANCER CENTER ONLY)
ALT: 6 U/L (ref 0–44)
AST: 9 U/L — ABNORMAL LOW (ref 15–41)
Albumin: 3.7 g/dL (ref 3.5–5.0)
Alkaline Phosphatase: 85 U/L (ref 38–126)
Anion gap: 7 (ref 5–15)
BUN: 13 mg/dL (ref 6–20)
CO2: 29 mmol/L (ref 22–32)
Calcium: 9.8 mg/dL (ref 8.9–10.3)
Chloride: 102 mmol/L (ref 98–111)
Creatinine: 0.97 mg/dL (ref 0.44–1.00)
GFR, Estimated: >60 mL/min (ref >60)
Glucose, Bld: 158 mg/dL — ABNORMAL HIGH (ref 70–99)
Potassium: 4.1 mmol/L (ref 3.5–5.1)
Sodium: 138 mmol/L (ref 135–145)
Total Bilirubin: 0.3 mg/dL (ref 0.3–1.2)
Total Protein: 7.3 g/dL (ref 6.5–8.1)

## 2022-11-30 LAB — CBC WITH DIFFERENTIAL (CANCER CENTER ONLY)
Abs Immature Granulocytes: 0.02 10*3/uL (ref 0.00–0.07)
Basophils Absolute: 0 10*3/uL (ref 0.0–0.1)
Basophils Relative: 0 %
Eosinophils Absolute: 0 10*3/uL (ref 0.0–0.5)
Eosinophils Relative: 0 %
HCT: 28.9 % — ABNORMAL LOW (ref 36.0–46.0)
Hemoglobin: 9.3 g/dL — ABNORMAL LOW (ref 12.0–15.0)
Immature Granulocytes: 0 %
Lymphocytes Relative: 6 %
Lymphs Abs: 0.3 10*3/uL — ABNORMAL LOW (ref 0.7–4.0)
MCH: 29.8 pg (ref 26.0–34.0)
MCHC: 32.2 g/dL (ref 30.0–36.0)
MCV: 92.6 fL (ref 80.0–100.0)
Monocytes Absolute: 0.1 10*3/uL (ref 0.1–1.0)
Monocytes Relative: 2 %
Neutro Abs: 4.9 10*3/uL (ref 1.7–7.7)
Neutrophils Relative %: 92 %
Platelet Count: 482 10*3/uL — ABNORMAL HIGH (ref 150–400)
RBC: 3.12 MIL/uL — ABNORMAL LOW (ref 3.87–5.11)
RDW: 19.3 % — ABNORMAL HIGH (ref 11.5–15.5)
WBC Count: 5.4 10*3/uL (ref 4.0–10.5)
nRBC: 0 % (ref 0.0–0.2)

## 2022-11-30 LAB — TSH: TSH: 0.919 u[IU]/mL (ref 0.350–4.500)

## 2022-11-30 LAB — TOTAL PROTEIN, URINE DIPSTICK: Protein, ur: 300 mg/dL — AB

## 2022-11-30 MED ORDER — CETIRIZINE HCL 10 MG/ML IV SOLN
10.0000 mg | Freq: Once | INTRAVENOUS | Status: AC
  Administered 2022-11-30: 10 mg via INTRAVENOUS
  Filled 2022-11-30: qty 1

## 2022-11-30 MED ORDER — SODIUM CHLORIDE 0.9 % IV SOLN
175.0000 mg/m2 | Freq: Once | INTRAVENOUS | Status: AC
  Administered 2022-11-30: 354 mg via INTRAVENOUS
  Filled 2022-11-30: qty 59

## 2022-11-30 MED ORDER — SODIUM CHLORIDE 0.9 % IV SOLN: 15.0000 mg/kg | Freq: Once | INTRAVENOUS | Status: DC

## 2022-11-30 MED ORDER — SODIUM CHLORIDE 0.9 % IV SOLN
536.0000 mg | Freq: Once | INTRAVENOUS | Status: AC
  Administered 2022-11-30: 540 mg via INTRAVENOUS
  Filled 2022-11-30: qty 54

## 2022-11-30 MED ORDER — SODIUM CHLORIDE 0.9 % IV SOLN
10.0000 mg | Freq: Once | INTRAVENOUS | Status: AC
  Administered 2022-11-30: 10 mg via INTRAVENOUS
  Filled 2022-11-30: qty 10

## 2022-11-30 MED ORDER — SODIUM CHLORIDE 0.9% FLUSH
10.0000 mL | INTRAVENOUS | Status: DC | PRN
  Administered 2022-11-30: 10 mL

## 2022-11-30 MED ORDER — PALONOSETRON HCL INJECTION 0.25 MG/5ML
0.2500 mg | Freq: Once | INTRAVENOUS | Status: AC
  Administered 2022-11-30: 0.25 mg via INTRAVENOUS
  Filled 2022-11-30: qty 5

## 2022-11-30 MED ORDER — SODIUM CHLORIDE 0.9% FLUSH
10.0000 mL | Freq: Once | INTRAVENOUS | Status: AC
  Administered 2022-11-30: 10 mL

## 2022-11-30 MED ORDER — SODIUM CHLORIDE 0.9 % IV SOLN: Freq: Once | INTRAVENOUS | Status: AC

## 2022-11-30 MED ORDER — HEPARIN SOD (PORK) LOCK FLUSH 100 UNIT/ML IV SOLN
500.0000 [IU] | Freq: Once | INTRAVENOUS | Status: AC | PRN
  Administered 2022-11-30: 500 [IU]

## 2022-11-30 MED ORDER — FAMOTIDINE IN NACL 20-0.9 MG/50ML-% IV SOLN
20.0000 mg | Freq: Once | INTRAVENOUS | Status: AC
  Administered 2022-11-30: 20 mg via INTRAVENOUS
  Filled 2022-11-30: qty 50

## 2022-11-30 MED ORDER — SODIUM CHLORIDE 0.9 % IV SOLN
150.0000 mg | Freq: Once | INTRAVENOUS | Status: AC
  Administered 2022-11-30: 150 mg via INTRAVENOUS
  Filled 2022-11-30: qty 150

## 2022-11-30 NOTE — Assessment & Plan Note (Signed)
Her pain is better controlled with combination of MS Contin and Dilaudid We discussed narcotic refill policy

## 2022-11-30 NOTE — Assessment & Plan Note (Signed)
Cause is unknown We will hold bevacizumab I plan to repeat urinalysis and urine culture again

## 2022-11-30 NOTE — Research (Signed)
[id number]: A RANDOMIZED TRIAL ADDRESSING CANCER-RELATED FINANCIAL HARDSHIP THROUGH DELIVERY OF A PROACTIVE FINANCIAL NAVIGATION INTERVENTION (CREDIT)   11/30/2022  FOLLOW-UP VISIT: Met with Westley Gambles during infusion. Gave patient an overview of study since last visit. Patient stated she would like to take home consents and brochure and speak with her husband about it further. Will follow-up next week to confirm interest. Thanked patient for her time and consideration of the above mentioned study.   Carol Ada, RT(R)(T) Clinical Research Coordinator

## 2022-11-30 NOTE — Assessment & Plan Note (Signed)
I reduce the dose of her chemotherapy accordingly We discussed importance of nutritional supplement

## 2022-11-30 NOTE — Patient Instructions (Signed)
[facility] MEDICAL [facility]COLOGY  Discharge Instructions: Thank you for choosing [facility] to provide your oncology and hematology care.   If you have a lab appointment with the Cancer Center, please go directly to the Cancer Center and check in at the registration area.   Wear comfortable clothing and clothing appropriate for easy access to any Portacath or PICC line.   We strive to give you quality time with your provider. You may need to reschedule your appointment if you arrive late (15 or more minutes).  Arriving late affects you and other patients whose appointments are after yours.  Also, if you miss three or more appointments without notifying the office, you may be dismissed from the clinic at the provider's discretion.      For prescription refill requests, have your pharmacy contact our office and allow 72 hours for refills to be completed.    Today you received the following chemotherapy and/or immunotherapy agents: paclitaxel and carboplatin      To help prevent nausea and vomiting after your treatment, we encourage you to take your nausea medication as directed.  BELOW ARE SYMPTOMS THAT SHOULD BE REPORTED IMMEDIATELY: *FEVER GREATER THAN 100.4 F (38 C) OR HIGHER *CHILLS OR SWEATING *NAUSEA AND VOMITING THAT IS NOT CONTROLLED WITH YOUR NAUSEA MEDICATION *UNUSUAL SHORTNESS OF BREATH *UNUSUAL BRUISING OR BLEEDING *URINARY PROBLEMS (pain or burning when urinating, or frequent urination) *BOWEL PROBLEMS (unusual diarrhea, constipation, pain near the anus) TENDERNESS IN MOUTH AND THROAT WITH OR WITHOUT PRESENCE OF ULCERS (sore throat, sores in mouth, or a toothache) UNUSUAL RASH, SWELLING OR PAIN  UNUSUAL VAGINAL DISCHARGE OR ITCHING   Items with * indicate a potential emergency and should be followed up as soon as possible or go to the Emergency Department if any problems should occur.  Please show the CHEMOTHERAPY ALERT CARD or IMMUNOTHERAPY ALERT  CARD at check-in to the Emergency Department and triage nurse.  Should you have questions after your visit or need to cancel or reschedule your appointment, please contact [facility]ICAL [facility]COL[facility]  Dept: [phone]  and follow the prompts.  Office hours are 8:00 a.m. to 4:30 p.m. Monday - Friday. Please note that voicemails left after 4:00 p.m. may not be returned until the following business day.  We are closed weekends and major holidays. You have access to a nurse at all times for urgent questions. Please call the main number to the clinic Dept: [phone] and follow the prompts.   For any non-urgent questions, you may also contact your provider using MyChart. We now offer e-Visits for anyone 18 and older to request care online for non-urgent symptoms. For details visit [email or web address].   Also download the MyChart app! Go to the app store, search "MyChart", open the app, select [facility], and log in with your MyChart username and password.  Masks are optional in the cancer centers. If you would like for your care team to wear a mask while they are taking care of you, please let them know. You may have one support person who is at least 59 years old accompany you for your appointments. 

## 2022-11-30 NOTE — Assessment & Plan Note (Signed)
I recommend application for permanent disability I completed application to help her get disability parking placard

## 2022-11-30 NOTE — Progress Notes (Signed)
Per Dr. Alvy Bimler ok to proceed with treatment without urine protein lab.

## 2022-11-30 NOTE — Progress Notes (Signed)
[facility] OFFICE PROGRESS NOTE  Patient Care Team: [name], MD as PCP - General (Family Medicine)  ASSESSMENT & PLAN:  Cervical cancer [facility]) I am concerned about her wellbeing She has lost weight However, her pain appears to be well-controlled She have difficulties with urinary incontinence After long discussion, we are in agreement to proceed with chemotherapy Plan to repeat PET/CT imaging before her next cycle of therapy and we will see her back for assessment  Metastasis to bone [facility]) Her bone pain is well controlled We discussed narcotic refill policy and management of constipation  Cancer associated pain Her pain is better controlled with combination of MS Contin and Dilaudid We discussed narcotic refill policy  Weight loss I reduce the dose of her chemotherapy accordingly We discussed importance of nutritional supplement  Physical debility I recommend application for permanent disability I completed application to help her get disability parking placard  Proteinuria Cause is unknown We will hold bevacizumab I plan to repeat urinalysis and urine culture again  Orders Placed This Encounter  Procedures   Urine Culture    Standing Status:   Future    Standing Expiration Date:   12/01/2023   NM PET Image Restage (PS) Skull Base to Thigh (F-18 FDG)    Standing Status:   Future    Standing Expiration Date:   12/01/2023    Order Specific Question:   If indicated for the ordered procedure, I authorize the administration of a radiopharmaceutical per Radiology protocol    Answer:   Yes    Order Specific Question:   Preferred imaging location?    Answer:   [facility]    Order Specific Question:   Radiology Contrast Protocol - do NOT remove file path    Answer:   [email or web address]    Order Specific Question:   Is the patient pregnant?    Answer:   No   Urinalysis, Complete w Microscopic     Standing Status:   Future    Standing Expiration Date:   12/01/2023    All questions were answered. The patient knows to call the clinic with any problems, questions or concerns. The total time spent in the appointment was 40 minutes encounter with patients including review of chart and various tests results, discussions about plan of care and coordination of care plan   Heath Lark, MD 11/30/2022 10:49 AM  INTERVAL HISTORY: Please see below for problem oriented charting. she returns for treatment follow-up with her husband She has lost weight due to poor appetite She has difficulties with urinary incontinence Her urine appears to be clear without blood Her pain appears to be somewhat better controlled  REVIEW OF SYSTEMS:   Constitutional: Denies fevers, chills or abnormal weight loss Eyes: Denies blurriness of vision Ears, nose, mouth, throat, and face: Denies mucositis or sore throat Respiratory: Denies cough, dyspnea or wheezes Cardiovascular: Denies palpitation, chest discomfort or lower extremity swelling Gastrointestinal:  Denies nausea, heartburn or change in bowel habits Skin: Denies abnormal skin rashes Lymphatics: Denies new lymphadenopathy or easy bruising Neurological:Denies numbness, tingling or new weaknesses Behavioral/Psych: Mood is stable, no new changes  All other systems were reviewed with the patient and are negative.  I have reviewed the past medical history, past surgical history, social history and family history with the patient and they are unchanged from previous note.  ALLERGIES:  has No Known Allergies.  MEDICATIONS:  Current Outpatient Medications  Medication Sig Dispense Refill   acetaminophen (TYLENOL) 325  MG tablet Take 650 mg by mouth every 6 (six) hours as needed for moderate pain.      ALPRAZolam (XANAX) 0.5 MG tablet Take 0.5 mg by mouth at bedtime as needed for anxiety.     cyclobenzaprine (FLEXERIL) 5 MG tablet Take 1 tablet (5 mg total) by  mouth 3 (three) times daily as needed for muscle spasms. 30 tablet 0   dexamethasone (DECADRON) 4 MG tablet Take 2 tabs the night before and 2 tab the morning of chemotherapy, every 3 weeks, by mouth x 6 cycles 36 tablet 6   diphenhydramine-acetaminophen (TYLENOL PM) 25-500 MG TABS tablet Take 1 tablet by mouth at bedtime as needed (sleep).      escitalopram (LEXAPRO) 5 MG tablet Take 1 tablet (5 mg total) by mouth daily. 30 tablet 1   HYDROmorphone (DILAUDID) 8 MG tablet Take 1 tablet (8 mg total) by mouth every 4 (four) hours as needed for severe pain. 90 tablet 0   levothyroxine (SYNTHROID) 88 MCG tablet Take 1 tablet (88 mcg total) by mouth daily. 30 tablet 1   lidocaine-prilocaine (EMLA) cream Apply to affected area once 30 g 3   morphine (MS CONTIN) 30 MG 12 hr tablet Take 1 tablet (30 mg total) by mouth every 12 (twelve) hours. 20 tablet 0   ondansetron (ZOFRAN) 8 MG tablet Take 1 tablet (8 mg total) by mouth every 8 (eight) hours as needed for nausea or vomiting. Start on the third day after chemotherapy. 30 tablet 1   prochlorperazine (COMPAZINE) 10 MG tablet Take 1 tablet (10 mg total) by mouth every 6 (six) hours as needed for nausea or vomiting. 30 tablet 1   No current facility-administered medications for this visit.   Facility-Administered Medications Ordered in Other Visits  Medication Dose Route Frequency Provider Last Rate Last Admin   CARBOplatin (PARAPLATIN) 540 mg in sodium chloride 0.9 % 250 mL chemo infusion  540 mg Intravenous Once Alvy Bimler, Srinivas Lippman, MD       cetirizine (QUZYTTIR) injection 10 mg  10 mg Intravenous Once Alvy Bimler, Dhiya Smits, MD       dexamethasone (DECADRON) 10 mg in sodium chloride 0.9 % 50 mL IVPB  10 mg Intravenous Once Alvy Bimler, Angela Vazguez, MD 204 mL/hr at 11/30/22 1041 10 mg at 11/30/22 1041   famotidine (PEPCID) IVPB 20 mg premix  20 mg Intravenous Once Alvy Bimler, Miaya Lafontant, MD       fosaprepitant (EMEND) 150 mg in sodium chloride 0.9 % 145 mL IVPB  150 mg Intravenous Once Alvy Bimler,  Elie Leppo, MD 450 mL/hr at 11/30/22 1040 150 mg at 11/30/22 1040   heparin lock flush 100 unit/mL  500 Units Intracatheter Once PRN Alvy Bimler, Mahogani Holohan, MD       PACLitaxel (TAXOL) 354 mg in sodium chloride 0.9 % 500 mL chemo infusion (> '80mg'$ /m2)  175 mg/m2 (Treatment Plan Recorded) Intravenous Once Alvy Bimler, Ndea Kilroy, MD       palonosetron (ALOXI) injection 0.25 mg  0.25 mg Intravenous Once Yajaira Doffing, MD       sodium chloride flush (NS) 0.9 % injection 10 mL  10 mL Intracatheter PRN Alvy Bimler, Kaiser Belluomini, MD        SUMMARY OF ONCOLOGIC HISTORY: Oncology History  Cervical cancer ([place])  02/09/2020 Initial Diagnosis   Between 6-8 months ago, the patient endorses having irregular spotting intermittnetly every few weeks that would last for a few days. This did not change over time and she describes it as light spotting. This was the first bleeding she had after menopause.  She ultimately saw her PCP who performed a pap in March which returned showing HSIL. The patient tells me today that this is the first pap test she has had ever. She was then referred to gyn and underwent EMB/LEEP and transvaginal ultrasound for work-up of her PMB and high grade cervical dysplasia. Unfortunately, her pathology revealed SCC of the cervix   05/03/2020 Pathology Results   Endocervical curettings show fragments of squamous epithelium with high-grade squamous intraepithelial lesion, CIN-3 as well as fragments of invasive squamous cell carcinoma within the endometrial biopsy.  The invasive squamous cell carcinoma have depth of invasion more than 3 mm, with at least 10 mm, thickness almost 1.2 mm.   05/18/2020 Imaging   1. No findings of metastatic disease to the abdomen/pelvis. 2. There is a small amount of gas either along the cervical canal or the adjacent fornix. A well-defined mass is not seen. 3. Chronic bilateral pars defects at L5 with 7 mm grade 1 anterolisthesis of L5 on S1 and resulting mild left foraminal stenosis. 4. Suspected right foraminal  disc protrusion at L3-4 possibly causing mild right foraminal impingement.     05/21/2020 Cancer Staging   Staging form: Cervix Uteri, AJCC Version 9 - Clinical stage from 05/21/2020: FIGO Stage IVB (rcT4, cN0, cM1) - Signed by Heath Lark, MD on 09/28/2022 Stage prefix: Recurrence   05/26/2020 PET scan   1. Intensely hypermetabolic circumferential metabolic activity in the uterine cervix consistent with cervical carcinoma. 2. No evidence of metastatic disease in the pelvis. No metastatic lymphadenopathy. 3. No evidence of distant metastatic disease. 4. Focus of metabolic activity in the gastric antrum would be unlikely location for a cervical carcinoma metastasis. Favor gastritis or potentially primary gastric neoplasm. Consider upper endoscopy for further evaluation. 5. Diffuse activity in the thyroid gland is favored thyroiditis.   06/03/2020 Procedure   Status post right IJ port catheter.   06/07/2020 - 07/07/2020 Chemotherapy   The patient had weekly cisplatin for chemotherapy treatment.     06/07/2020 - 08/23/2020 Radiation Therapy    Radiation Treatment Dates: 06/07/2020 through 07/19/2020 Site Technique Total Dose (Gy) Dose per Fx (Gy) Completed Fx Beam Energies  Cervix: Cervix IMRT 45/45 1.8 25/25 6X  Cervix: Cervix_Bst 3D 9/9 1.8 5/5 15X    HDR brachytherapy: 07/26/2020, 08/06/2020, 08/09/2020, 08/17/2020, 08/23/2020     11/22/2020 PET scan   Marked interval response to therapy with no visible mass at the site of previous ill-defined thickening of the cervix, and marked interval decrease in FDG uptake associated with this area as outlined above.   No signs of new disease in the neck, chest, abdomen or of the pelvis   Markedly hypermetabolic thyroid gland likely related to thyroiditis is minimally changed, correlate with thyroid function. Given asymmetric enlargement of the RIGHT thyroid lobe would also suggest thyroid ultrasound for further evaluation.     12/15/2020 Procedure    Removal of implanted Port-A-Cath utilizing sharp and blunt dissection. The procedure was uncomplicated.     05/24/2021 Pathology Results   Cervical biopsy showed necroinflammatory debris   09/26/2022 PET scan   1. Ill-defined hypermetabolic aggressive masslike soft tissue extending from the posterior lower uterine segment/cervix into the left hemipelvis is consistent with recurrent/metastatic disease. The mass appears to invade a portion of the sigmoid colon and the urinary bladder with gas seen in the central portion of the mass likely reflecting a fistula. 2. Hypermetabolic right-sided pulmonary and pleural metastases. 3. Hypermetabolic osseous metastatic lesions centered at the L3-L4 vertebral  body and right seventh rib. 4. Hypermetabolic nodular thickening of the gastric antrum, nonspecific consider correlation with upper endoscopy.  5. Asymmetric hypermetabolic activity along the left glossotonsillar sulcus is nonspecific consider correlation with direct visualization 6. Hypermetabolic 15 mm right thyroid nodule, suggest further evaluation with dedicated thyroid ultrasound. 7. Left hydroureteronephrosis to the level of the pelvic mass with associated cortical renal atrophy.   10/03/2022 Imaging   CT pelvis 1. No signs of patent fistulous communication of the colon with the necrotic left pelvic mass, urinary bladder or uterus. 2. Similar appearance of large centrally necrotic mass involving the left side of uterus, left vaginal apex, left pelvic sidewall and left posterolateral bladder. 3. High attenuation material is identified within the left posterior bladder near the UVJ which was present on the examination from 09/25/2022. 4. Bilateral L5 pars defects with anterolisthesis of L5 on S1. 5.  Aortic Atherosclerosis (ICD10-I70.0).   10/03/2022 Imaging   Renal perfusion study  Normal RIGHT renogram.   No functional LEFT kidney/renal tissue identified.   10/18/2022 Procedure    Placement of a subcutaneous power-injectable port device. Catheter tip at the superior cavoatrial junction.   10/19/2022 -  Chemotherapy   Patient is on Treatment Plan : Cervical cancer Carboplatin + Paclitaxel + Bevacizumab q21d        PHYSICAL EXAMINATION: ECOG PERFORMANCE STATUS: 1 - Symptomatic but completely ambulatory  Vitals:   11/30/22 0952  BP: 138/75  Pulse: (!) 108  Resp: 18  Temp: 98.4 F (36.9 C)  SpO2: 94%   Filed Weights   11/30/22 0952  Weight: 189 lb 9.6 oz (86 kg)    GENERAL:alert, no distress and comfortable NEURO: alert & oriented x 3 with fluent speech, no focal motor/sensory deficits  LABORATORY DATA:  I have reviewed the data as listed    Component Value Date/Time   NA 138 11/30/2022 0927   K 4.1 11/30/2022 0927   CL 102 11/30/2022 0927   CO2 29 11/30/2022 0927   GLUCOSE 158 (H) 11/30/2022 0927   BUN 13 11/30/2022 0927   CREATININE 0.97 11/30/2022 0927   CALCIUM 9.8 11/30/2022 0927   PROT 7.3 11/30/2022 0927   ALBUMIN 3.7 11/30/2022 0927   AST 9 (L) 11/30/2022 0927   ALT 6 11/30/2022 0927   ALKPHOS 85 11/30/2022 0927   BILITOT 0.3 11/30/2022 0927   GFRNONAA >60 11/30/2022 0927   GFRAA >60 07/05/2020 1202    No results found for: "SPEP", "UPEP"  Lab Results  Component Value Date   WBC 5.4 11/30/2022   NEUTROABS 4.9 11/30/2022   HGB 9.3 (L) 11/30/2022   HCT 28.9 (L) 11/30/2022   MCV 92.6 11/30/2022   PLT 482 (H) 11/30/2022      Chemistry      Component Value Date/Time   NA 138 11/30/2022 0927   K 4.1 11/30/2022 0927   CL 102 11/30/2022 0927   CO2 29 11/30/2022 0927   BUN 13 11/30/2022 0927   CREATININE 0.97 11/30/2022 0927      Component Value Date/Time   CALCIUM 9.8 11/30/2022 0927   ALKPHOS 85 11/30/2022 0927   AST 9 (L) 11/30/2022 0927   ALT 6 11/30/2022 0927   BILITOT 0.3 11/30/2022 8453

## 2022-11-30 NOTE — Assessment & Plan Note (Signed)
Her bone pain is well controlled We discussed narcotic refill policy and management of constipation

## 2022-11-30 NOTE — Telephone Encounter (Signed)
Patient called to cancel 1/4 appointment due to scan being scheduled same day. Patient notified of new appointment time/date

## 2022-11-30 NOTE — Assessment & Plan Note (Signed)
I am concerned about her wellbeing She has lost weight However, her pain appears to be well-controlled She have difficulties with urinary incontinence After long discussion, we are in agreement to proceed with chemotherapy Plan to repeat PET/CT imaging before her next cycle of therapy and we will see her back for assessment

## 2022-11-30 NOTE — Telephone Encounter (Signed)
Received PD-L1 report from [facility] but it does not have results.  Called [facility] and spoke to [facility], systems number [phone].  Per Evelena Peat the testing was ordered under an incorrect test number so the report is to close out the incorrect test.  The actual PD-L1 testing is still pending and should be reported tomorrow afternoon.  Asked if they can fax the report directly to [place].  Evelena Peat is going to check with [facility] and will fax the results as soon as they are available.

## 2022-12-05 ENCOUNTER — Other Ambulatory Visit (HOSPITAL_COMMUNITY): Payer: Self-pay

## 2022-12-05 ENCOUNTER — Other Ambulatory Visit: Payer: Self-pay | Admitting: Hematology and Oncology

## 2022-12-05 DIAGNOSIS — N820 Vesicovaginal fistula: Secondary | ICD-10-CM | POA: Diagnosis not present

## 2022-12-05 DIAGNOSIS — N828 Other female genital tract fistulae: Secondary | ICD-10-CM | POA: Diagnosis not present

## 2022-12-05 DIAGNOSIS — R531 Weakness: Secondary | ICD-10-CM | POA: Diagnosis not present

## 2022-12-05 DIAGNOSIS — R58 Hemorrhage, not elsewhere classified: Secondary | ICD-10-CM | POA: Diagnosis not present

## 2022-12-05 DIAGNOSIS — N939 Abnormal uterine and vaginal bleeding, unspecified: Secondary | ICD-10-CM | POA: Diagnosis not present

## 2022-12-05 MED ORDER — MORPHINE SULFATE ER 30 MG PO TBCR
30.0000 mg | EXTENDED_RELEASE_TABLET | Freq: Two times a day (BID) | ORAL | 0 refills | Status: DC
  Filled 2022-12-05: qty 20, 10d supply, fill #0

## 2022-12-06 ENCOUNTER — Other Ambulatory Visit (HOSPITAL_COMMUNITY): Payer: Self-pay

## 2022-12-06 ENCOUNTER — Other Ambulatory Visit: Payer: Self-pay | Admitting: *Deleted

## 2022-12-06 ENCOUNTER — Inpatient Hospital Stay: Payer: BC Managed Care – PPO

## 2022-12-06 ENCOUNTER — Other Ambulatory Visit: Payer: Self-pay

## 2022-12-06 ENCOUNTER — Telehealth: Payer: Self-pay | Admitting: *Deleted

## 2022-12-06 ENCOUNTER — Encounter (HOSPITAL_COMMUNITY): Payer: Self-pay

## 2022-12-06 ENCOUNTER — Inpatient Hospital Stay (HOSPITAL_BASED_OUTPATIENT_CLINIC_OR_DEPARTMENT_OTHER): Payer: BC Managed Care – PPO | Admitting: Physician Assistant

## 2022-12-06 ENCOUNTER — Encounter (HOSPITAL_COMMUNITY): Payer: Self-pay | Admitting: Family Medicine

## 2022-12-06 ENCOUNTER — Inpatient Hospital Stay (HOSPITAL_COMMUNITY)
Admit: 2022-12-06 | Discharge: 2022-12-09 | DRG: 392 | Disposition: A | Discharge status: Home / Self Care | Payer: BC Managed Care – PPO | Source: Ambulatory Visit | Attending: Internal Medicine | Admitting: Internal Medicine

## 2022-12-06 VITALS — BP 147/84 | HR 99 | Temp 98.9°F | Resp 18 | Wt 180.0 lb

## 2022-12-06 DIAGNOSIS — Z87891 Personal history of nicotine dependence: Secondary | ICD-10-CM | POA: Diagnosis not present

## 2022-12-06 DIAGNOSIS — Z823 Family history of stroke: Secondary | ICD-10-CM

## 2022-12-06 DIAGNOSIS — G893 Neoplasm related pain (acute) (chronic): Secondary | ICD-10-CM

## 2022-12-06 DIAGNOSIS — R112 Nausea with vomiting, unspecified: Principal | ICD-10-CM | POA: Diagnosis present

## 2022-12-06 DIAGNOSIS — E871 Hypo-osmolality and hyponatremia: Secondary | ICD-10-CM | POA: Diagnosis not present

## 2022-12-06 DIAGNOSIS — D6481 Anemia due to antineoplastic chemotherapy: Secondary | ICD-10-CM

## 2022-12-06 DIAGNOSIS — E669 Obesity, unspecified: Secondary | ICD-10-CM | POA: Diagnosis present

## 2022-12-06 DIAGNOSIS — C530 Malignant neoplasm of endocervix: Secondary | ICD-10-CM | POA: Diagnosis not present

## 2022-12-06 DIAGNOSIS — D62 Acute posthemorrhagic anemia: Secondary | ICD-10-CM | POA: Diagnosis present

## 2022-12-06 DIAGNOSIS — R945 Abnormal results of liver function studies: Secondary | ICD-10-CM | POA: Diagnosis not present

## 2022-12-06 DIAGNOSIS — C7951 Secondary malignant neoplasm of bone: Secondary | ICD-10-CM | POA: Diagnosis not present

## 2022-12-06 DIAGNOSIS — E876 Hypokalemia: Secondary | ICD-10-CM | POA: Diagnosis not present

## 2022-12-06 DIAGNOSIS — T451X5A Adverse effect of antineoplastic and immunosuppressive drugs, initial encounter: Secondary | ICD-10-CM

## 2022-12-06 DIAGNOSIS — C539 Malignant neoplasm of cervix uteri, unspecified: Secondary | ICD-10-CM

## 2022-12-06 DIAGNOSIS — Z923 Personal history of irradiation: Secondary | ICD-10-CM

## 2022-12-06 HISTORY — DX: Nausea with vomiting, unspecified: R11.2

## 2022-12-06 LAB — CBC WITH DIFFERENTIAL (CANCER CENTER ONLY)
Abs Immature Granulocytes: 0.05 10*3/uL (ref 0.00–0.07)
Basophils Absolute: 0 10*3/uL (ref 0.0–0.1)
Basophils Relative: 0 %
Eosinophils Absolute: 0 10*3/uL (ref 0.0–0.5)
Eosinophils Relative: 0 %
HCT: 23.1 % — ABNORMAL LOW (ref 36.0–46.0)
Hemoglobin: 7.7 g/dL — ABNORMAL LOW (ref 12.0–15.0)
Immature Granulocytes: 1 %
Lymphocytes Relative: 10 %
Lymphs Abs: 0.5 10*3/uL — ABNORMAL LOW (ref 0.7–4.0)
MCH: 29.6 pg (ref 26.0–34.0)
MCHC: 33.3 g/dL (ref 30.0–36.0)
MCV: 88.8 fL (ref 80.0–100.0)
Monocytes Absolute: 0.3 10*3/uL (ref 0.1–1.0)
Monocytes Relative: 5 %
Neutro Abs: 4.4 10*3/uL (ref 1.7–7.7)
Neutrophils Relative %: 84 %
Platelet Count: 312 10*3/uL (ref 150–400)
RBC: 2.6 MIL/uL — ABNORMAL LOW (ref 3.87–5.11)
RDW: 18.9 % — ABNORMAL HIGH (ref 11.5–15.5)
WBC Count: 5.2 10*3/uL (ref 4.0–10.5)
nRBC: 0 % (ref 0.0–0.2)

## 2022-12-06 LAB — CMP (CANCER CENTER ONLY)
ALT: 65 U/L — ABNORMAL HIGH (ref 0–44)
AST: 38 U/L (ref 15–41)
Albumin: 3.7 g/dL (ref 3.5–5.0)
Alkaline Phosphatase: 74 U/L (ref 38–126)
Anion gap: 7 (ref 5–15)
BUN: 17 mg/dL (ref 6–20)
CO2: 31 mmol/L (ref 22–32)
Calcium: 9.7 mg/dL (ref 8.9–10.3)
Chloride: 96 mmol/L — ABNORMAL LOW (ref 98–111)
Creatinine: 0.84 mg/dL (ref 0.44–1.00)
GFR, Estimated: >60 mL/min (ref >60)
Glucose, Bld: 135 mg/dL — ABNORMAL HIGH (ref 70–99)
Potassium: 3.4 mmol/L — ABNORMAL LOW (ref 3.5–5.1)
Sodium: 134 mmol/L — ABNORMAL LOW (ref 135–145)
Total Bilirubin: 0.5 mg/dL (ref 0.3–1.2)
Total Protein: 7.1 g/dL (ref 6.5–8.1)

## 2022-12-06 MED ORDER — SODIUM CHLORIDE 0.9 % IV SOLN: 8.0000 mg | Freq: Once | INTRAVENOUS | Status: DC

## 2022-12-06 MED ORDER — FAMOTIDINE IN NACL 20-0.9 MG/50ML-% IV SOLN: 20.0000 mg | Freq: Once | INTRAVENOUS | Status: DC

## 2022-12-06 MED ORDER — SODIUM CHLORIDE 0.9% FLUSH
10.0000 mL | Freq: Once | INTRAVENOUS | Status: AC
  Administered 2022-12-06: 10 mL

## 2022-12-06 MED ORDER — HYDROMORPHONE HCL 1 MG/ML IJ SOLN
1.0000 mg | INTRAMUSCULAR | Status: DC | PRN
  Administered 2022-12-06 – 2022-12-09 (×13): 1 mg via INTRAVENOUS
  Filled 2022-12-06 (×13): qty 1

## 2022-12-06 MED ORDER — HYDROMORPHONE HCL 1 MG/ML IJ SOLN
1.0000 mg | Freq: Once | INTRAMUSCULAR | Status: AC
  Administered 2022-12-06: 1 mg via INTRAVENOUS
  Filled 2022-12-06: qty 1

## 2022-12-06 MED ORDER — POTASSIUM CHLORIDE 10 MEQ/100ML IV SOLN
10.0000 meq | INTRAVENOUS | Status: AC
  Administered 2022-12-06 (×3): 10 meq via INTRAVENOUS
  Filled 2022-12-06 (×3): qty 100

## 2022-12-06 MED ORDER — ONDANSETRON HCL 4 MG/2ML IJ SOLN
8.0000 mg | Freq: Once | INTRAMUSCULAR | Status: AC
  Administered 2022-12-06: 8 mg via INTRAVENOUS
  Filled 2022-12-06: qty 4

## 2022-12-06 MED ORDER — ONDANSETRON HCL 4 MG/2ML IJ SOLN: 4.0000 mg | Freq: Four times a day (QID) | INTRAMUSCULAR | Status: DC | PRN

## 2022-12-06 MED ORDER — ENOXAPARIN SODIUM 40 MG/0.4ML IJ SOSY
40.0000 mg | PREFILLED_SYRINGE | Freq: Every day | INTRAMUSCULAR | Status: DC
  Filled 2022-12-06: qty 0.4

## 2022-12-06 MED ORDER — ACETAMINOPHEN 325 MG PO TABS: 650.0000 mg | ORAL_TABLET | Freq: Four times a day (QID) | ORAL | Status: DC | PRN

## 2022-12-06 MED ORDER — ACETAMINOPHEN 650 MG RE SUPP: 650.0000 mg | Freq: Four times a day (QID) | RECTAL | Status: DC | PRN

## 2022-12-06 MED ORDER — FAMOTIDINE IN NACL 20-0.9 MG/50ML-% IV SOLN
20.0000 mg | Freq: Once | INTRAVENOUS | Status: AC
  Administered 2022-12-06: 20 mg via INTRAVENOUS
  Filled 2022-12-06: qty 50

## 2022-12-06 MED ORDER — SODIUM CHLORIDE 0.9 % IV SOLN: Freq: Once | INTRAVENOUS | Status: AC

## 2022-12-06 MED ORDER — ONDANSETRON HCL 4 MG/2ML IJ SOLN: 8.0000 mg | Freq: Once | INTRAMUSCULAR | Status: DC

## 2022-12-06 MED ORDER — SODIUM CHLORIDE 0.9 % IV SOLN: INTRAVENOUS | Status: DC

## 2022-12-06 MED ORDER — ALUM & MAG HYDROXIDE-SIMETH 200-200-20 MG/5ML PO SUSP
30.0000 mL | Freq: Four times a day (QID) | ORAL | Status: DC | PRN
  Administered 2022-12-06 – 2022-12-08 (×2): 30 mL via ORAL
  Filled 2022-12-06 (×2): qty 30

## 2022-12-06 NOTE — Telephone Encounter (Signed)
Pt husband called stating pt was recently in the ER in [place] with severe nausea and vomiting and IVF was given. Pt continues to have N/V. [facility] and port flush/lab appt was made for 12/27 today. Called and notified pt of time. Pt verbalized understanding

## 2022-12-06 NOTE — Progress Notes (Signed)
Symptom Management Consult note [facility]    Patient Care Team: [name], MD as PCP - General (Family Medicine)    Name of the patient: Kelly Hanson  [id number]  [birth year]   Date of visit: 12/06/2022   Chief Complaint/Reason for visit: pain, nausea and vomiting   Current Therapy: Carboplatin and paclitaxel  Last treatment:  Day 1   Cycle 3 on 11/30/22   ASSESSMENT & PLAN: Patient is a 61 y.o. female  with oncologic history of metastatic cervical cancer followed by Dr. Alvy Bimler.  I have viewed most recent oncology note and lab work.    #) Metastatic Cervical Cancer - Next appointment with oncologist is 12/15/22.   #) Anemia -HgB today is 7.7. This is decrease from labs x 6 days ago when it was 9.3.  -Patient continues to have blood in her urine although thinks amount has decreased compared to last week. No other obvious source of bleeding. -Anemia is possibly related to chemotherapy. Patient would benefit from blood transfusion.  #)Symptom management: intractable pain and nausea with vomiting -Patient unable to tolerate PO intake including home medication zofran, compazine and dilaudid. Pain was previously controlled with PO medications. -Patient has lost 9 pounds in the last x 6 days. -Patient given IV zofran, pepcid, and dilaudid in clinic. Symptoms slightly improved on reassessment although still unable to tolerate PO intake. -As patient is unable to tolerate PO intake she will need admission for ongoing medication management and pain control.  -Discussed case with hospitalist Dr. Williams Che who accepts patient for admission. Appreciate her assistance. Will defer blood transfusion to inpatient care.      Heme/Onc History: Oncology History Overview Note  PD-L1 CPS >=1%   Cervical cancer ([place])  02/09/2020 Initial Diagnosis   Between 6-8 months ago, the patient endorses having irregular spotting intermittnetly every few weeks that would last for a  few days. This did not change over time and she describes it as light spotting. This was the first bleeding she had after menopause. She ultimately saw her PCP who performed a pap in March which returned showing HSIL. The patient tells me today that this is the first pap test she has had ever. She was then referred to gyn and underwent EMB/LEEP and transvaginal ultrasound for work-up of her PMB and high grade cervical dysplasia. Unfortunately, her pathology revealed SCC of the cervix   05/03/2020 Pathology Results   Endocervical curettings show fragments of squamous epithelium with high-grade squamous intraepithelial lesion, CIN-3 as well as fragments of invasive squamous cell carcinoma within the endometrial biopsy.  The invasive squamous cell carcinoma have depth of invasion more than 3 mm, with at least 10 mm, thickness almost 1.2 mm.   05/18/2020 Imaging   1. No findings of metastatic disease to the abdomen/pelvis. 2. There is a small amount of gas either along the cervical canal or the adjacent fornix. A well-defined mass is not seen. 3. Chronic bilateral pars defects at L5 with 7 mm grade 1 anterolisthesis of L5 on S1 and resulting mild left foraminal stenosis. 4. Suspected right foraminal disc protrusion at L3-4 possibly causing mild right foraminal impingement.     05/21/2020 Cancer Staging   Staging form: Cervix Uteri, AJCC Version 9 - Clinical stage from 05/21/2020: FIGO Stage IVB (rcT4, cN0, cM1) - Signed by Heath Lark, MD on 09/28/2022 Stage prefix: Recurrence   05/26/2020 PET scan   1. Intensely hypermetabolic circumferential metabolic activity in the uterine cervix consistent with  cervical carcinoma. 2. No evidence of metastatic disease in the pelvis. No metastatic lymphadenopathy. 3. No evidence of distant metastatic disease. 4. Focus of metabolic activity in the gastric antrum would be unlikely location for a cervical carcinoma metastasis. Favor gastritis or potentially primary  gastric neoplasm. Consider upper endoscopy for further evaluation. 5. Diffuse activity in the thyroid gland is favored thyroiditis.   06/03/2020 Procedure   Status post right IJ port catheter.   06/07/2020 - 07/07/2020 Chemotherapy   The patient had weekly cisplatin for chemotherapy treatment.     06/07/2020 - 08/23/2020 Radiation Therapy    Radiation Treatment Dates: 06/07/2020 through 07/19/2020 Site Technique Total Dose (Gy) Dose per Fx (Gy) Completed Fx Beam Energies  Cervix: Cervix IMRT 45/45 1.8 25/25 6X  Cervix: Cervix_Bst 3D 9/9 1.8 5/5 15X    HDR brachytherapy: 07/26/2020, 08/06/2020, 08/09/2020, 08/17/2020, 08/23/2020     11/22/2020 PET scan   Marked interval response to therapy with no visible mass at the site of previous ill-defined thickening of the cervix, and marked interval decrease in FDG uptake associated with this area as outlined above.   No signs of new disease in the neck, chest, abdomen or of the pelvis   Markedly hypermetabolic thyroid gland likely related to thyroiditis is minimally changed, correlate with thyroid function. Given asymmetric enlargement of the RIGHT thyroid lobe would also suggest thyroid ultrasound for further evaluation.     12/15/2020 Procedure   Removal of implanted Port-A-Cath utilizing sharp and blunt dissection. The procedure was uncomplicated.     05/24/2021 Pathology Results   Cervical biopsy showed necroinflammatory debris   09/26/2022 PET scan   1. Ill-defined hypermetabolic aggressive masslike soft tissue extending from the posterior lower uterine segment/cervix into the left hemipelvis is consistent with recurrent/metastatic disease. The mass appears to invade a portion of the sigmoid colon and the urinary bladder with gas seen in the central portion of the mass likely reflecting a fistula. 2. Hypermetabolic right-sided pulmonary and pleural metastases. 3. Hypermetabolic osseous metastatic lesions centered at the L3-L4 vertebral body  and right seventh rib. 4. Hypermetabolic nodular thickening of the gastric antrum, nonspecific consider correlation with upper endoscopy.  5. Asymmetric hypermetabolic activity along the left glossotonsillar sulcus is nonspecific consider correlation with direct visualization 6. Hypermetabolic 15 mm right thyroid nodule, suggest further evaluation with dedicated thyroid ultrasound. 7. Left hydroureteronephrosis to the level of the pelvic mass with associated cortical renal atrophy.   10/03/2022 Imaging   CT pelvis 1. No signs of patent fistulous communication of the colon with the necrotic left pelvic mass, urinary bladder or uterus. 2. Similar appearance of large centrally necrotic mass involving the left side of uterus, left vaginal apex, left pelvic sidewall and left posterolateral bladder. 3. High attenuation material is identified within the left posterior bladder near the UVJ which was present on the examination from 09/25/2022. 4. Bilateral L5 pars defects with anterolisthesis of L5 on S1. 5.  Aortic Atherosclerosis (ICD10-I70.0).   10/03/2022 Imaging   Renal perfusion study  Normal RIGHT renogram.   No functional LEFT kidney/renal tissue identified.   10/18/2022 Procedure   Placement of a subcutaneous power-injectable port device. Catheter tip at the superior cavoatrial junction.   10/19/2022 -  Chemotherapy   Patient is on Treatment Plan : Cervical cancer Carboplatin + Paclitaxel + Bevacizumab q21d          Interval history-: Kelly Hanson is a 61 y.o. female with oncologic history as above presenting to [facility] today with chief complaint of  uncontrollable pain and nausea and vomiting.  She is accompanied by her spouse who provides additional history.  Patient states for the last 3 days she has had uncontrollable nausea.  She has vomited over 10 times in the last 24 hours.  She has tried to take her Zofran and Compazine at home however immediately vomits afterwards.  Patient denies  any associated abdominal pain.  She states this is the first time she has had nausea this persistent.  She is also endorsing her typical "cancer pain" is out of control because she cannot take her pain medication Dilaudid.  Pain is currently 7 out of 10 in severity.  The pain is located on her ribs and in her back where she is known to have metastatic disease.  She denies any fall or new injury to cause this pain.  Patient admits to feeling just miserable at home because she cannot keep anything down.  She also endorses subjective fever. Her last bowel movement was this morning and stool was loose. She went to an outside ER yesterday and is unsure exactly what she was given, thinks it was nausea medication however continued to have vomiting and was discharged home.  Patient states since that ED visit symptoms have only worsened.  She denies any sick contacts.      ROS  All other systems are reviewed and are negative for acute change except as noted in the HPI.    No Known Allergies   Past Medical History:  Diagnosis Date   Anxiety    Cervical cancer [facility])    History of radiation therapy 06/07/2020-07/19/2020   Cervix IMRT ; Dr. Gery Pray   History of radiation therapy 08/23/2020   cervix HDR 07/26/2020-08/23/2020  Dr Gery Pray   Obesity (BMI 30-39.9)    Palpitations    Thyroid disease      Past Surgical History:  Procedure Laterality Date   IR IMAGING GUIDED PORT INSERTION  06/03/2020   IR IMAGING GUIDED PORT INSERTION  10/17/2022   IR REMOVAL TUN ACCESS W/ PORT W/O FL MOD SED  12/15/2020   OPERATIVE ULTRASOUND N/A 07/26/2020   Procedure: OPERATIVE ULTRASOUND;  Surgeon: Gery Pray, MD;  Location: [facility];  Service: Urology;  Laterality: N/A;   OPERATIVE ULTRASOUND N/A 08/06/2020   Procedure: OPERATIVE ULTRASOUND;  Surgeon: Gery Pray, MD;  Location: [facility];  Service: Urology;  Laterality: N/A;   OPERATIVE ULTRASOUND N/A 08/09/2020    Procedure: OPERATIVE ULTRASOUND;  Surgeon: Gery Pray, MD;  Location: [facility];  Service: Urology;  Laterality: N/A;   OPERATIVE ULTRASOUND N/A 08/17/2020   Procedure: OPERATIVE ULTRASOUND;  Surgeon: Gery Pray, MD;  Location: [facility];  Service: Urology;  Laterality: N/A;   OPERATIVE ULTRASOUND N/A 08/23/2020   Procedure: OPERATIVE ULTRASOUND;  Surgeon: Gery Pray, MD;  Location: [facility]  Service: Urology;  Laterality: N/A;   TANDEM RING INSERTION  08/06/2020   TANDEM RING INSERTION N/A 07/26/2020   Procedure: TANDEM RING INSERTION FOR HIGH DOSE RADIATION THERAPY;  Surgeon: Gery Pray, MD;  Location: [facility];  Service: Urology;  Laterality: N/A;   TANDEM RING INSERTION N/A 08/06/2020   Procedure: TANDEM RING INSERTION;  Surgeon: Gery Pray, MD;  Location: [facility];  Service: Urology;  Laterality: N/A;   TANDEM RING INSERTION N/A 08/09/2020   Procedure: TANDEM RING INSERTION;  Surgeon: Gery Pray, MD;  Location: [facility];  Service: Urology;  Laterality:  N/A;   TANDEM RING INSERTION N/A 08/17/2020   Procedure: TANDEM RING INSERTION;  Surgeon: Gery Pray, MD;  Location: [facility];  Service: Urology;  Laterality: N/A;   TANDEM RING INSERTION N/A 08/23/2020   Procedure: TANDEM RING INSERTION;  Surgeon: Gery Pray, MD;  Location: [facility];  Service: Urology;  Laterality: N/A;   THERAPEUTIC ABORTION      Social History   Socioeconomic History   Marital status: Married    Spouse name: Dominica Severin   Number of children: 0   Years of education: Not on file   Highest education level: Not on file  Occupational History   Occupation: accountant  Tobacco Use   Smoking status: Former   Smokeless tobacco: Never   Tobacco comments:    quit 2019  Vaping Use   Vaping Use: Never used  Substance and Sexual Activity   Alcohol use: Yes     Alcohol/week: 1.0 standard drink of alcohol    Types: 1 Glasses of wine per week    Comment: occ   Drug use: Never   Sexual activity: Yes  Other Topics Concern   Not on file  Social History Narrative   Not on file   Social Determinants of Health   Financial Resource Strain: Not on file  Food Insecurity: Not on file  Transportation Needs: Not on file  Physical Activity: Not on file  Stress: Not on file  Social Connections: Not on file  Intimate Partner Violence: Not on file    Family History  Problem Relation Age of Onset   Stroke Maternal Grandmother    Colon cancer Neg Hx    Breast cancer Neg Hx    Endometrial cancer Neg Hx    Ovarian cancer Neg Hx      Current Outpatient Medications:    acetaminophen (TYLENOL) 325 MG tablet, Take 650 mg by mouth every 6 (six) hours as needed for moderate pain. , Disp: , Rfl:    ALPRAZolam (XANAX) 0.5 MG tablet, Take 0.5 mg by mouth at bedtime as needed for anxiety., Disp: , Rfl:    cyclobenzaprine (FLEXERIL) 5 MG tablet, Take 1 tablet (5 mg total) by mouth 3 (three) times daily as needed for muscle spasms., Disp: 30 tablet, Rfl: 0   dexamethasone (DECADRON) 4 MG tablet, Take 2 tabs the night before and 2 tab the morning of chemotherapy, every 3 weeks, by mouth x 6 cycles, Disp: 36 tablet, Rfl: 6   diphenhydramine-acetaminophen (TYLENOL PM) 25-500 MG TABS tablet, Take 1 tablet by mouth at bedtime as needed (sleep). , Disp: , Rfl:    escitalopram (LEXAPRO) 5 MG tablet, Take 1 tablet (5 mg total) by mouth daily., Disp: 30 tablet, Rfl: 1   HYDROmorphone (DILAUDID) 8 MG tablet, Take 1 tablet (8 mg total) by mouth every 4 (four) hours as needed for severe pain., Disp: 90 tablet, Rfl: 0   levothyroxine (SYNTHROID) 88 MCG tablet, Take 1 tablet (88 mcg total) by mouth daily., Disp: 30 tablet, Rfl: 1   lidocaine-prilocaine (EMLA) cream, Apply to affected area once, Disp: 30 g, Rfl: 3   morphine (MS CONTIN) 30 MG 12 hr tablet, Take 1 tablet (30 mg  total) by mouth every 12 (twelve) hours., Disp: 20 tablet, Rfl: 0   ondansetron (ZOFRAN) 8 MG tablet, Take 1 tablet (8 mg total) by mouth every 8 (eight) hours as needed for nausea or vomiting. Start on the third day after chemotherapy., Disp: 30 tablet, Rfl: 1  prochlorperazine (COMPAZINE) 10 MG tablet, Take 1 tablet (10 mg total) by mouth every 6 (six) hours as needed for nausea or vomiting., Disp: 30 tablet, Rfl: 1  Current Facility-Administered Medications:    famotidine (PEPCID) IVPB 20 mg premix, 20 mg, Intravenous, Once, Gorsuch, Ni, MD   ondansetron (ZOFRAN) injection 8 mg, 8 mg, Intravenous, Once, Walisiewicz, Camreigh Michie E, PA-C  PHYSICAL EXAM: ECOG FS:2 - Symptomatic, <50% confined to bed    Vitals:   12/06/22 1315 12/06/22 1522  BP: (!) 147/84   Pulse: 99   Resp: 18   Temp: 99.5 F (37.5 C) 98.9 F (37.2 C)  TempSrc: Oral   SpO2: 100%   Weight: 180 lb (81.6 kg)    Physical Exam Vitals and nursing note reviewed.  Constitutional:      Appearance: She is well-developed. She is ill-appearing. She is not toxic-appearing.  HENT:     Head: Normocephalic.     Nose: Nose normal.     Mouth/Throat:     Mouth: Mucous membranes are dry.  Eyes:     Conjunctiva/sclera: Conjunctivae normal.  Neck:     Vascular: No JVD.  Cardiovascular:     Rate and Rhythm: Normal rate and regular rhythm.     Pulses: Normal pulses.     Heart sounds: Normal heart sounds.  Pulmonary:     Effort: Pulmonary effort is normal.     Breath sounds: Normal breath sounds.  Abdominal:     General: Bowel sounds are normal. There is no distension.     Palpations: Abdomen is soft. There is no mass.     Tenderness: There is no abdominal tenderness. There is no guarding or rebound.     Hernia: No hernia is present.  Musculoskeletal:     Cervical back: Normal range of motion.     Right lower leg: No edema.     Left lower leg: No edema.  Skin:    General: Skin is warm and dry.     Capillary Refill:  Capillary refill takes less than 2 seconds.     Coloration: Skin is pale.     Findings: No bruising.  Neurological:     Mental Status: She is oriented to person, place, and time.        LABORATORY DATA: I have reviewed the data as listed    Latest Ref Rng & Units 12/06/2022   12:30 PM 11/30/2022    9:27 AM 11/09/2022    9:11 AM  CBC  WBC 4.0 - 10.5 K/uL 5.2  5.4  8.6   Hemoglobin 12.0 - 15.0 g/dL 7.7  9.3  9.9   Hematocrit 36.0 - 46.0 % 23.1  28.9  31.3   Platelets 150 - 400 K/uL 312  482  505         Latest Ref Rng & Units 12/06/2022   12:30 PM 11/30/2022    9:27 AM 11/09/2022    9:11 AM  CMP  Glucose 70 - 99 mg/dL 135  158  200   BUN 6 - 20 mg/dL _0 Creatinine 0.44 - 1.00 mg/dL 0.84  0.97  0.95   Sodium 135 - 145 mmol/L 134  138  138   Potassium 3.5 - 5.1 mmol/L 3.4  4.1  3.8   Chloride 98 - 111 mmol/L 96  102  103   CO2 22 - 32 mmol/L _1 Calcium 8.9 - 10.3 mg/dL 9.7  9.8  9.9   Total Protein 6.5 - 8.1 g/dL 7.1  7.3  7.4   Total Bilirubin 0.3 - 1.2 mg/dL 0.5  0.3  0.3   Alkaline Phos 38 - 126 U/L 74  85  95   AST 15 - 41 U/L 38  9  8   ALT 0 - 44 U/L 65  6  5        RADIOGRAPHIC STUDIES (from last 24 hours if applicable) I have personally reviewed the radiological images as listed and agreed with the findings in the report. No results found.      Visit Diagnosis: 1. Intractable nausea and vomiting   2. Anemia due to antineoplastic chemotherapy   3. Malignant neoplasm of cervix, unspecified site ([place])   4. Cancer associated pain   5. Metastasis to bone (H[facility])      No orders of the defined types were placed in this encounter.   All questions were answered. No barriers to learning was detected.  I have spent a total of 30 minutes minutes of face-to-face and non-face-to-face time, preparing to see the patient, obtaining and/or reviewing separately obtained history, performing a medically appropriate examination, counseling and  educating the patient, ordering tests, documenting clinical information in the electronic health record, and care coordination (communications with other health care professionals or caregivers).    Thank you for allowing me to participate in the care of this patient.    Barrie Folk, PA-C Department of Hematology/Oncology [facility] at [facility] Phone: [phone]  [phone]    12/06/2022 4:47 PM

## 2022-12-06 NOTE — Progress Notes (Signed)
Pt transferred to inpatient, report given to RN.

## 2022-12-06 NOTE — H&P (Signed)
History and Physical    Kelly Hanson [id number] DOB: [birth year] DOA: 12/06/2022  PCP: [name], MD   Patient coming from: Home   Chief Complaint: N/V, pain   HPI: Kelly Hanson is a pleasant 61 y.o. female with medical history significant for cervical cancer metastatic to bone who was seen in the cancer center today for severe nausea and vomiting with weight loss and inability to take her oral medications.  She was given IV antiemetics and IV analgesia at the cancer center, had some improvement, but is still unable to tolerate anything by mouth and so direct admission to [facility] was arranged.   She reports severe nausea and vomiting for the past 24 hours.  She has had a couple loose stools associated with this but denies any fever or new abdominal pain.  She was able to eat 2 crackers in the past 24 hours, but has mainly been vomiting anytime she attempts to eat or drink.  Review of Systems:  All other systems reviewed and apart from HPI, are negative.  Past Medical History:  Diagnosis Date   Anxiety    Cervical cancer [facility])    History of radiation therapy 06/07/2020-07/19/2020   Cervix IMRT ; Dr. Gery Pray   History of radiation therapy 08/23/2020   cervix HDR 07/26/2020-08/23/2020  Dr Gery Pray   Intractable nausea and vomiting 12/06/2022   Obesity (BMI 30-39.9)    Palpitations    Thyroid disease     Past Surgical History:  Procedure Laterality Date   IR IMAGING GUIDED PORT INSERTION  06/03/2020   IR IMAGING GUIDED PORT INSERTION  10/17/2022   IR REMOVAL TUN ACCESS W/ PORT W/O FL MOD SED  12/15/2020   OPERATIVE ULTRASOUND N/A 07/26/2020   Procedure: OPERATIVE ULTRASOUND;  Surgeon: Gery Pray, MD;  Location: [facility];  Service: Urology;  Laterality: N/A;   OPERATIVE ULTRASOUND N/A 08/06/2020   Procedure: OPERATIVE ULTRASOUND;  Surgeon: Gery Pray, MD;  Location: [facility];  Service: Urology;  Laterality: N/A;    OPERATIVE ULTRASOUND N/A 08/09/2020   Procedure: OPERATIVE ULTRASOUND;  Surgeon: Gery Pray, MD;  Location: [facility];  Service: Urology;  Laterality: N/A;   OPERATIVE ULTRASOUND N/A 08/17/2020   Procedure: OPERATIVE ULTRASOUND;  Surgeon: Gery Pray, MD;  Location: [facility];  Service: Urology;  Laterality: N/A;   OPERATIVE ULTRASOUND N/A 08/23/2020   Procedure: OPERATIVE ULTRASOUND;  Surgeon: Gery Pray, MD;  Location: [facility];  Service: Urology;  Laterality: N/A;   TANDEM RING INSERTION  08/06/2020   TANDEM RING INSERTION N/A 07/26/2020   Procedure: TANDEM RING INSERTION FOR HIGH DOSE RADIATION THERAPY;  Surgeon: Gery Pray, MD;  Location: [facility];  Service: Urology;  Laterality: N/A;   TANDEM RING INSERTION N/A 08/06/2020   Procedure: TANDEM RING INSERTION;  Surgeon: Gery Pray, MD;  Location: [facility];  Service: Urology;  Laterality: N/A;   TANDEM RING INSERTION N/A 08/09/2020   Procedure: TANDEM RING INSERTION;  Surgeon: Gery Pray, MD;  Location: [facility];  Service: Urology;  Laterality: N/A;   TANDEM RING INSERTION N/A 08/17/2020   Procedure: TANDEM RING INSERTION;  Surgeon: Gery Pray, MD;  Location: [facility];  Service: Urology;  Laterality: N/A;   TANDEM RING INSERTION N/A 08/23/2020   Procedure: TANDEM RING INSERTION;  Surgeon: Gery Pray, MD;  Location: [facility];  Service: Urology;  Laterality: N/A;   THERAPEUTIC ABORTION  Social History:   reports that she has quit smoking. She has never used smokeless tobacco. She reports current alcohol use of about 1.0 standard drink of alcohol per week. She reports that she does not use drugs.  No Known Allergies  Family History  Problem Relation Age of Onset   Stroke Maternal Grandmother    Colon cancer Neg Hx    Breast cancer Neg Hx    Endometrial cancer Neg Hx    Ovarian  cancer Neg Hx      Prior to Admission medications   Medication Sig Start Date End Date Taking? Authorizing Provider  acetaminophen (TYLENOL) 325 MG tablet Take 650 mg by mouth every 6 (six) hours as needed for moderate pain.     [provider]  ALPRAZolam Duanne Moron) 0.5 MG tablet Take 0.5 mg by mouth at bedtime as needed for anxiety.    [provider]  cyclobenzaprine (FLEXERIL) 5 MG tablet Take 1 tablet (5 mg total) by mouth 3 (three) times daily as needed for muscle spasms. 11/09/22   Heath Lark, MD  dexamethasone (DECADRON) 4 MG tablet Take 2 tabs the night before and 2 tab the morning of chemotherapy, every 3 weeks, by mouth x 6 cycles 11/20/22   Heath Lark, MD  diphenhydramine-acetaminophen (TYLENOL PM) 25-500 MG TABS tablet Take 1 tablet by mouth at bedtime as needed (sleep).     [provider]  escitalopram (LEXAPRO) 5 MG tablet Take 1 tablet (5 mg total) by mouth daily. 11/20/22   Heath Lark, MD  HYDROmorphone (DILAUDID) 8 MG tablet Take 1 tablet (8 mg total) by mouth every 4 (four) hours as needed for severe pain. 11/28/22   Heath Lark, MD  levothyroxine (SYNTHROID) 88 MCG tablet Take 1 tablet (88 mcg total) by mouth daily. 11/20/22   Heath Lark, MD  lidocaine-prilocaine (EMLA) cream Apply to affected area once 09/28/22   Heath Lark, MD  morphine (MS CONTIN) 30 MG 12 hr tablet Take 1 tablet (30 mg total) by mouth every 12 (twelve) hours. 12/05/22   Orson Slick, MD  ondansetron (ZOFRAN) 8 MG tablet Take 1 tablet (8 mg total) by mouth every 8 (eight) hours as needed for nausea or vomiting. Start on the third day after chemotherapy. 09/28/22   Heath Lark, MD  prochlorperazine (COMPAZINE) 10 MG tablet Take 1 tablet (10 mg total) by mouth every 6 (six) hours as needed for nausea or vomiting. 09/28/22   Heath Lark, MD    Physical Exam: There were no vitals filed for this visit.  Constitutional: NAD, calm  Eyes: PERTLA, lids and conjunctivae  normal ENMT: Mucous membranes are moist. Posterior pharynx clear of any exudate or lesions.   Neck: supple, no masses  Respiratory:  no wheezing, no crackles. No accessory muscle use.  Cardiovascular: S1 & S2 heard, regular rate and rhythm. No JVD. Abdomen: No distension, soft. Bowel sounds active.  Musculoskeletal: no clubbing / cyanosis. No joint deformity upper and lower extremities.   Skin: no significant rashes, lesions, ulcers. Warm, dry, well-perfused. Neurologic: CN 2-12 grossly intact. Moving all extremities. Alert and oriented.  Psychiatric: Pleasant. Cooperative.    Labs and Imaging on Admission: I have personally reviewed following labs and imaging studies  CBC: Recent Labs  Lab 11/30/22 0927 12/06/22 1230  WBC 5.4 5.2  NEUTROABS 4.9 4.4  HGB 9.3* 7.7*  HCT 28.9* 23.1*  MCV 92.6 88.8  PLT 482* 109   Basic Metabolic Panel: Recent Labs  Lab 11/30/22 0927 12/06/22  1230  NA 138 134*  K 4.1 3.4*  CL 102 96*  CO2 29 31  GLUCOSE 158* 135*  BUN 13 17  CREATININE 0.97 0.84  CALCIUM 9.8 9.7   GFR: Estimated Creatinine Clearance: 78.3 mL/min (by C-G formula based on SCr of 0.84 mg/dL). Liver Function Tests: Recent Labs  Lab 11/30/22 0927 12/06/22 1230  AST 9* 38  ALT 6 65*  ALKPHOS 85 74  BILITOT 0.3 0.5  PROT 7.3 7.1  ALBUMIN 3.7 3.7   No results for input(s): "LIPASE", "AMYLASE" in the last 168 hours. No results for input(s): "AMMONIA" in the last 168 hours. Coagulation Profile: No results for input(s): "INR", "PROTIME" in the last 168 hours. Cardiac Enzymes: No results for input(s): "CKTOTAL", "CKMB", "CKMBINDEX", "TROPONINI" in the last 168 hours. BNP (last 3 results) No results for input(s): "PROBNP" in the last 8760 hours. HbA1C: No results for input(s): "HGBA1C" in the last 72 hours. CBG: No results for input(s): "GLUCAP" in the last 168 hours. Lipid Profile: No results for input(s): "CHOL", "HDL", "LDLCALC", "TRIG", "CHOLHDL", "LDLDIRECT" in  the last 72 hours. Thyroid Function Tests: No results for input(s): "TSH", "T4TOTAL", "FREET4", "T3FREE", "THYROIDAB" in the last 72 hours. Anemia Panel: No results for input(s): "VITAMINB12", "FOLATE", "FERRITIN", "TIBC", "IRON", "RETICCTPCT" in the last 72 hours. Urine analysis:    Component Value Date/Time   COLORURINE YELLOW 11/09/2022 1224   APPEARANCEUR CLOUDY (A) 11/09/2022 1224   LABSPEC 1.010 11/09/2022 1224   PHURINE 7.0 11/09/2022 1224   GLUCOSEU NEGATIVE 11/09/2022 1224   HGBUR LARGE (A) 11/09/2022 1224   BILIRUBINUR NEGATIVE 11/09/2022 1224   KETONESUR NEGATIVE 11/09/2022 1224   PROTEINUR 300 (A) 11/30/2022 1033   NITRITE NEGATIVE 11/09/2022 1224   LEUKOCYTESUR LARGE (A) 11/09/2022 1224   Sepsis Labs: '@LABRCNTIP'$ (procalcitonin:4,lacticidven:4) )No results found for this or any previous visit (from the past 240 hour(s)).   Radiological Exams on Admission: No results found.   Assessment/Plan   1. Intractable N/V  - Presents with N/V with weight loss and inability to tolerate her oral medications  - Exam is benign    - Continue IVF hydration, IV antiemetics, monitor electrolytes, advance diet as she improves, consider imaging if worsens or fails to improve   2. Cervical cancer; cancer-associated chronic pain  - Follows with Dr. Alvy Bimler for cervical cancer metastatic to bone, currently on carboplatin, paclitaxel, and bevacizumab - Pain is currently uncontrolled d/t N/V and being unable to tolerate her home medications, will treat pain parenterally for now    3. Anemia  - Hgb is 7.7 on admission, down from 9.3 a week earlier   - Pt reports decreased hematuria, there is no other clinical bleeding  - Repeat CBC in am, transfuse if needed    DVT prophylaxis: Lovenox  Code Status: Full  Level of Care: Level of care: Med-Surg Family Communication: None present   Disposition Plan:  Patient is from: home  Anticipated d/c is to: TBD Anticipated d/c date is: 12/28 or  12/08/22  Patient currently: Pending tolerance of adequate oral intake, pain-control  Consults called: none  Admission status: Observation     Vianne Bulls, MD [facility]  12/06/2022, 7:53 PM

## 2022-12-06 NOTE — Patient Instructions (Signed)

## 2022-12-07 ENCOUNTER — Other Ambulatory Visit: Payer: Self-pay

## 2022-12-07 ENCOUNTER — Encounter (HOSPITAL_COMMUNITY): Payer: Self-pay | Admitting: Family Medicine

## 2022-12-07 DIAGNOSIS — R112 Nausea with vomiting, unspecified: Secondary | ICD-10-CM | POA: Diagnosis not present

## 2022-12-07 DIAGNOSIS — C539 Malignant neoplasm of cervix uteri, unspecified: Secondary | ICD-10-CM

## 2022-12-07 DIAGNOSIS — D62 Acute posthemorrhagic anemia: Secondary | ICD-10-CM | POA: Diagnosis not present

## 2022-12-07 LAB — MAGNESIUM: Magnesium: 1.5 mg/dL — ABNORMAL LOW (ref 1.7–2.4)

## 2022-12-07 LAB — ABO/RH: ABO/RH(D): O POS

## 2022-12-07 LAB — COMPREHENSIVE METABOLIC PANEL
ALT: 72 U/L — ABNORMAL HIGH (ref 0–44)
AST: 44 U/L — ABNORMAL HIGH (ref 15–41)
Albumin: 3 g/dL — ABNORMAL LOW (ref 3.5–5.0)
Alkaline Phosphatase: 63 U/L (ref 38–126)
Anion gap: 7 (ref 5–15)
BUN: 14 mg/dL (ref 6–20)
CO2: 27 mmol/L (ref 22–32)
Calcium: 8.4 mg/dL — ABNORMAL LOW (ref 8.9–10.3)
Chloride: 102 mmol/L (ref 98–111)
Creatinine, Ser: 0.91 mg/dL (ref 0.44–1.00)
GFR, Estimated: >60 mL/min (ref >60)
Glucose, Bld: 104 mg/dL — ABNORMAL HIGH (ref 70–99)
Potassium: 3.2 mmol/L — ABNORMAL LOW (ref 3.5–5.1)
Sodium: 136 mmol/L (ref 135–145)
Total Bilirubin: 0.5 mg/dL (ref 0.3–1.2)
Total Protein: 5.9 g/dL — ABNORMAL LOW (ref 6.5–8.1)

## 2022-12-07 LAB — CBC
HCT: 20.4 % — ABNORMAL LOW (ref 36.0–46.0)
Hemoglobin: 6.5 g/dL — CL (ref 12.0–15.0)
MCH: 29.8 pg (ref 26.0–34.0)
MCHC: 31.9 g/dL (ref 30.0–36.0)
MCV: 93.6 fL (ref 80.0–100.0)
Platelets: 231 10*3/uL (ref 150–400)
RBC: 2.18 MIL/uL — ABNORMAL LOW (ref 3.87–5.11)
RDW: 19.4 % — ABNORMAL HIGH (ref 11.5–15.5)
WBC: 4.1 10*3/uL (ref 4.0–10.5)
nRBC: 0 % (ref 0.0–0.2)

## 2022-12-07 LAB — HIV ANTIBODY (ROUTINE TESTING W REFLEX): HIV Screen 4th Generation wRfx: NONREACTIVE

## 2022-12-07 LAB — PREPARE RBC (CROSSMATCH)

## 2022-12-07 LAB — SAMPLE TO BLOOD BANK

## 2022-12-07 MED ORDER — CHLORHEXIDINE GLUCONATE CLOTH 2 % EX PADS
6.0000 | MEDICATED_PAD | Freq: Every day | CUTANEOUS | Status: DC
  Administered 2022-12-07 – 2022-12-09 (×3): 6 via TOPICAL

## 2022-12-07 MED ORDER — MAGNESIUM SULFATE 2 GM/50ML IV SOLN
2.0000 g | Freq: Once | INTRAVENOUS | Status: AC
  Administered 2022-12-07: 2 g via INTRAVENOUS
  Filled 2022-12-07: qty 50

## 2022-12-07 MED ORDER — PANTOPRAZOLE SODIUM 40 MG PO TBEC
40.0000 mg | DELAYED_RELEASE_TABLET | Freq: Every day | ORAL | Status: DC
  Administered 2022-12-07 – 2022-12-08 (×2): 40 mg via ORAL
  Filled 2022-12-07 (×2): qty 1

## 2022-12-07 MED ORDER — POTASSIUM CHLORIDE CRYS ER 20 MEQ PO TBCR
40.0000 meq | EXTENDED_RELEASE_TABLET | Freq: Two times a day (BID) | ORAL | Status: AC
  Administered 2022-12-07 (×2): 40 meq via ORAL
  Filled 2022-12-07 (×2): qty 2

## 2022-12-07 MED ORDER — SODIUM CHLORIDE 0.9 % IV SOLN: INTRAVENOUS | Status: DC

## 2022-12-07 MED ORDER — SODIUM CHLORIDE 0.9% IV SOLUTION: Freq: Once | INTRAVENOUS | Status: DC

## 2022-12-07 NOTE — Progress Notes (Signed)
[facility] PROGRESS NOTE   Kelly Hanson [id number]:[id number] DOB: 1960/12/29 DOA: 12/06/2022  PCP: [name], MD  Brief History/Interval Summary: 61 y.o. female with medical history significant for cervical cancer metastatic to bone who was seen in the cancer center on the day of admission for severe nausea and vomiting with weight loss and inability to take her oral medications.  She was given IV antiemetics and IV analgesia at the cancer center without much improvement.  She was subsequently hospitalized.  Consultants: None.  Her oncologist is aware of her hospitalization.  Procedures: None    Subjective/Interval History: Patient mentions that she is feeling slightly better today.  Has not vomited in the last 24 hours.  She also mention that a few days ago she had lower abdominal discomfort and subsequently had blood mixed with urine in her toilet bowl and then she did have a brief episode of syncope.  Denies any dizziness or lightheadedness currently.  Denies any chest pain shortness of breath.  Denies any active bleeding currently    Assessment/Plan:  Intractable nausea and vomiting Patient given symptomatic treatment IV fluids.  Symptoms appear to be improving.  Will challenge her with liquid diet today and see how she does.  Normocytic anemia/acute blood loss anemia She did mention passing some blood in her urine a few days ago.  No active bleeding currently.  Her hemoglobin has dropped down to 6.5. Could be from her vaginal bleeding from the cervical cancer. She will be transfused 2 units of PRBC today.  Hypokalemia and hypomagnesemia This will be repleted.  Mildly elevated LFTs Unclear etiology.  Abdomen is benign.  Recheck labs tomorrow.  History of cervical cancer with cancer associated pain Followed by Dr. Alvy Bimler.  Has received chemotherapy with carboplatin, paclitaxel and bevacizumab.  DVT Prophylaxis: Change to SCDs due to recent bleeding Code Status:  Full code Family Communication: Discussed with patient Disposition Plan: Hopefully return home when improved.  Start mobilizing.  Status is: Observation The patient will require care spanning > 2 midnights and should be moved to inpatient because: Anemia requiring blood transfusion, fatigue, electrolyte imbalance    Medications: Scheduled:  sodium chloride   Intravenous Once   Chlorhexidine Gluconate Cloth  6 each Topical Daily   enoxaparin (LOVENOX) injection  40 mg Subcutaneous QHS   pantoprazole  40 mg Oral Q1200   potassium chloride  40 mEq Oral BID   Continuous:  sodium chloride 125 mL/hr at 12/07/22 1018   magnesium sulfate bolus IVPB 2 g (12/07/22 1023)   GYI:[id number] **OR** acetaminophen, alum & mag hydroxide-simeth, HYDROmorphone (DILAUDID) injection, ondansetron (ZOFRAN) IV  Antibiotics: Anti-infectives (From admission, onward)    None       Objective:  Vital Signs  Vitals:   12/06/22 2100 12/06/22 2300 12/07/22 0451  BP: 132/72 (!) 144/80 131/82  Pulse: 82 82 80  Resp: '18 16 16  '$ Temp: 98.2 F (36.8 C) 98.2 F (36.8 C) 98.1 F (36.7 C)  TempSrc: Oral Oral Oral  SpO2: 98% 99% 99%    Intake/Output Summary (Last 24 hours) at 12/07/2022 1106 Last data filed at 12/07/2022 0500 Gross per 24 hour  Intake 190.81 ml  Output 500 ml  Net -309.19 ml   There were no vitals filed for this visit.  General appearance: Awake alert.  In no distress Resp: Clear to auscultation bilaterally.  Normal effort Cardio: S1-S2 is normal regular.  No S3-S4.  No rubs murmurs or bruit GI: Abdomen is soft.  Nontender nondistended.  Bowel sounds are present normal.  No masses organomegaly Extremities: No edema.  Full range of motion of lower extremities.   Lab Results:  Data Reviewed: I have personally reviewed following labs and reports of the imaging studies  CBC: Recent Labs  Lab 12/06/22 1230 12/07/22 0636  WBC 5.2 4.1  NEUTROABS 4.4  --   HGB 7.7* 6.5*   HCT 23.1* 20.4*  MCV 88.8 93.6  PLT 312 902    Basic Metabolic Panel: Recent Labs  Lab 12/06/22 1230 12/07/22 0636  NA 134* 136  K 3.4* 3.2*  CL 96* 102  CO2 31 27  GLUCOSE 135* 104*  BUN 17 14  CREATININE 0.84 0.91  CALCIUM 9.7 8.4*  MG  --  1.5*    GFR: Estimated Creatinine Clearance: 72.3 mL/min (by C-G formula based on SCr of 0.91 mg/dL).  Liver Function Tests: Recent Labs  Lab 12/06/22 1230 12/07/22 0636  AST 38 44*  ALT 65* 72*  ALKPHOS 74 63  BILITOT 0.5 0.5  PROT 7.1 5.9*  ALBUMIN 3.7 3.0*     Radiology Studies: No results found.     LOS: 0 days   Kelly Hanson [employer] on [email or web address]  12/07/2022, 11:06 AM

## 2022-12-07 NOTE — Progress Notes (Signed)
  Transition of Care [facility]) Screening Note   Patient Details  Name: Eiza Canniff Date of Birth: 1961-08-28   Transition of Care [facility]) CM/SW Contact:    Dessa Phi, RN Phone Number: 12/07/2022, 12:29 PM    Transition of Care Department [facility]) has reviewed patient and no TOC needs have been identified at this time. We will continue to monitor patient advancement through interdisciplinary progression rounds. If new patient transition needs arise, please place a TOC consult.

## 2022-12-07 NOTE — Plan of Care (Signed)
Patient AOX4, VSS throughout shift.  All meds given on time as ordered.  Pt c/o pain relieved by PRN dilaudid.  Diminished lungs, IS encouraged.  Labs drawn off port.  Fluids on-going.  POC maintained, will continue to monitor.  Problem: Education: Goal: Knowledge of General Education information will improve Description: Including pain rating scale, medication(s)/side effects and non-pharmacologic comfort measures Outcome: Progressing   Problem: Health Behavior/Discharge Planning: Goal: Ability to manage health-related needs will improve Outcome: Progressing   Problem: Clinical Measurements: Goal: Ability to maintain clinical measurements within normal limits will improve Outcome: Progressing Goal: Will remain free from infection Outcome: Progressing Goal: Diagnostic test results will improve Outcome: Progressing Goal: Respiratory complications will improve Outcome: Progressing Goal: Cardiovascular complication will be avoided Outcome: Progressing   Problem: Activity: Goal: Risk for activity intolerance will decrease Outcome: Progressing   Problem: Nutrition: Goal: Adequate nutrition will be maintained Outcome: Progressing   Problem: Coping: Goal: Level of anxiety will decrease Outcome: Progressing   Problem: Elimination: Goal: Will not experience complications related to bowel motility Outcome: Progressing Goal: Will not experience complications related to urinary retention Outcome: Progressing   Problem: Pain Managment: Goal: General experience of comfort will improve Outcome: Progressing   Problem: Safety: Goal: Ability to remain free from injury will improve Outcome: Progressing   Problem: Skin Integrity: Goal: Risk for impaired skin integrity will decrease Outcome: Progressing

## 2022-12-08 ENCOUNTER — Inpatient Hospital Stay (HOSPITAL_COMMUNITY): Payer: BC Managed Care – PPO

## 2022-12-08 DIAGNOSIS — Z87891 Personal history of nicotine dependence: Secondary | ICD-10-CM | POA: Diagnosis not present

## 2022-12-08 DIAGNOSIS — C530 Malignant neoplasm of endocervix: Secondary | ICD-10-CM | POA: Diagnosis not present

## 2022-12-08 DIAGNOSIS — C539 Malignant neoplasm of cervix uteri, unspecified: Secondary | ICD-10-CM | POA: Diagnosis present

## 2022-12-08 DIAGNOSIS — T451X5A Adverse effect of antineoplastic and immunosuppressive drugs, initial encounter: Secondary | ICD-10-CM | POA: Diagnosis present

## 2022-12-08 DIAGNOSIS — Z823 Family history of stroke: Secondary | ICD-10-CM | POA: Diagnosis not present

## 2022-12-08 DIAGNOSIS — E669 Obesity, unspecified: Secondary | ICD-10-CM | POA: Diagnosis present

## 2022-12-08 DIAGNOSIS — G893 Neoplasm related pain (acute) (chronic): Secondary | ICD-10-CM | POA: Diagnosis present

## 2022-12-08 DIAGNOSIS — R112 Nausea with vomiting, unspecified: Secondary | ICD-10-CM | POA: Diagnosis not present

## 2022-12-08 DIAGNOSIS — E876 Hypokalemia: Secondary | ICD-10-CM | POA: Diagnosis present

## 2022-12-08 DIAGNOSIS — Z923 Personal history of irradiation: Secondary | ICD-10-CM | POA: Diagnosis not present

## 2022-12-08 DIAGNOSIS — C7951 Secondary malignant neoplasm of bone: Secondary | ICD-10-CM | POA: Diagnosis present

## 2022-12-08 DIAGNOSIS — D62 Acute posthemorrhagic anemia: Secondary | ICD-10-CM | POA: Diagnosis present

## 2022-12-08 DIAGNOSIS — E871 Hypo-osmolality and hyponatremia: Secondary | ICD-10-CM | POA: Diagnosis present

## 2022-12-08 LAB — CBC
HCT: 26.3 % — ABNORMAL LOW (ref 36.0–46.0)
Hemoglobin: 8.5 g/dL — ABNORMAL LOW (ref 12.0–15.0)
MCH: 29.8 pg (ref 26.0–34.0)
MCHC: 32.3 g/dL (ref 30.0–36.0)
MCV: 92.3 fL (ref 80.0–100.0)
Platelets: 193 10*3/uL (ref 150–400)
RBC: 2.85 MIL/uL — ABNORMAL LOW (ref 3.87–5.11)
RDW: 18.3 % — ABNORMAL HIGH (ref 11.5–15.5)
WBC: 4.2 10*3/uL (ref 4.0–10.5)
nRBC: 0 % (ref 0.0–0.2)

## 2022-12-08 LAB — COMPREHENSIVE METABOLIC PANEL
ALT: 85 U/L — ABNORMAL HIGH (ref 0–44)
AST: 47 U/L — ABNORMAL HIGH (ref 15–41)
Albumin: 2.8 g/dL — ABNORMAL LOW (ref 3.5–5.0)
Alkaline Phosphatase: 61 U/L (ref 38–126)
Anion gap: 4 — ABNORMAL LOW (ref 5–15)
BUN: 11 mg/dL (ref 6–20)
CO2: 25 mmol/L (ref 22–32)
Calcium: 8.6 mg/dL — ABNORMAL LOW (ref 8.9–10.3)
Chloride: 105 mmol/L (ref 98–111)
Creatinine, Ser: 0.79 mg/dL (ref 0.44–1.00)
GFR, Estimated: >60 mL/min (ref >60)
Glucose, Bld: 88 mg/dL (ref 70–99)
Potassium: 3.9 mmol/L (ref 3.5–5.1)
Sodium: 134 mmol/L — ABNORMAL LOW (ref 135–145)
Total Bilirubin: 1 mg/dL (ref 0.3–1.2)
Total Protein: 5.9 g/dL — ABNORMAL LOW (ref 6.5–8.1)

## 2022-12-08 LAB — MAGNESIUM: Magnesium: 2.2 mg/dL (ref 1.7–2.4)

## 2022-12-08 MED ORDER — MELATONIN 5 MG PO TABS
5.0000 mg | ORAL_TABLET | Freq: Once | ORAL | Status: AC
  Administered 2022-12-08: 5 mg via ORAL
  Filled 2022-12-08: qty 1

## 2022-12-08 MED ORDER — SODIUM CHLORIDE 0.9 % IV SOLN: INTRAVENOUS | Status: AC

## 2022-12-08 NOTE — Progress Notes (Signed)
Initial Nutrition Assessment  DOCUMENTATION CODES:   Not applicable  INTERVENTION:  Chopped meats with all meals Ensure Enlive po BID, each supplement provides 350 kcal and 20 grams of protein. MVI with minerals daily  NUTRITION DIAGNOSIS:   Increased nutrient needs related to cancer and cancer related treatments as evidenced by estimated needs.   GOAL:   Patient will meet greater than or equal to 90% of their needs   MONITOR:   PO intake, Supplement acceptance, Diet advancement, Labs, Weight trends  REASON FOR ASSESSMENT:   Malnutrition Screening Tool    ASSESSMENT:   Pt admitted with nausea and vomiting. PMH significant for metastatic cervical cancer to bone.  RUQ [place] ordered d/t elevated LFTs. Pending results.   Pt sitting in bed eating first solid food meal since Sunday. Her husband present at bedside who assisted to provide nutrition related history. She began experiencing emesis on Sunday-Tuesday and has been unable to tolerate any PO intake. Prior to this, her PO intake was very poor. She was trying to "graze" throughout the day and would eat very small portion sizes. She tries to drink at least 1 Ensure daily to supplement when she misses a meal/snack.   Pt feeling much better today without episodes of emesis x2 days. No documented meal completions on file to review. Pt received lunch just prior to visit. Pt having difficulty cutting chicken. Placed order for chopped meats for ease of eating at meal times.   Reviewed weight history. Pt's weight noted to trending down consistently within the last 9 months. Pt noted to have had a 24% weight loss within this time which is clinically significant for time frame.   Given decreased PO intake and significant weight loss, highly suspect pt with a degree of malnutrition. Will reassess once able to perform nutrition focused physical exam.   Medications reviewed  Labs: sodium 134, anion gap 4, AST 47, ALT 85  NUTRITION -  FOCUSED PHYSICAL EXAM: Pt eating. Deferred to follow up.   Diet Order:   Diet Order             DIET SOFT Room service appropriate? Yes; Fluid consistency: Thin  Diet effective now                   EDUCATION NEEDS:   Education needs have been addressed  Skin:  Skin Assessment: Reviewed RN Assessment  Last BM:  12/2 (type 6)  Height:   Ht Readings from Last 1 Encounters:  12/08/22 '5\' 6"'$  (1.676 m)    Weight:   Wt Readings from Last 1 Encounters:  12/08/22 81.6 kg   BMI:  Body mass index is 29.05 kg/m.  Estimated Nutritional Needs:   Kcal:  2200-2400  Protein:  110-125g  Fluid:  >/=2L  Clayborne Dana, RDN, LDN Clinical Nutrition

## 2022-12-08 NOTE — Progress Notes (Signed)
[facility] PROGRESS NOTE   Kelly Hanson [id number] DOB: 11-23-1961 DOA: 12/06/2022  PCP: [name], MD  Brief History/Interval Summary: 61 y.o. female with medical history significant for cervical cancer metastatic to bone who was seen in the cancer center on the day of admission for severe nausea and vomiting with weight loss and inability to take her oral medications.  She was given IV antiemetics and IV analgesia at the cancer center without much improvement.  She was subsequently hospitalized.  Consultants: None.  Her oncologist is aware of her hospitalization.  Procedures: None    Subjective/Interval History: Patient mentions that she has not had any nausea and vomiting in the last 24 hours.  Tolerating liquid diet.  Tolerated blood transfusion well.  Has not really ambulated much.  Denies any vaginal bleeding.  Assessment/Plan:  Intractable nausea and vomiting Possibly due to chemotherapy.  Did not improve with outpatient management.  Was hospitalized.  Seems to be improving.  Tolerated her liquid diet yesterday.  Will be advanced to soft diet today.    Normocytic anemia/acute blood loss anemia She did mention passing some blood in her urine a few days ago.  Could have had vaginal bleeding from her cervical cancer.  Does not have any active bleeding currently.  Her hemoglobin did drop to 6.5 yesterday. She was transfused 2 units of PRBC with improvement in hemoglobin to 8.5 today. Recheck labs tomorrow.  Hypokalemia and hypomagnesemia Potassium level has improved.  Magnesium is 2.2.  Mildly elevated LFTs Etiology unclear.  Could be medication effect.  Could be due to acute illness.  LFTs are stable for the most part.  Since she did present with nausea and vomiting may not be unreasonable to do a right upper quadrant ultrasound which will be ordered.  Check hepatitis panel.  History of cervical cancer with cancer associated pain Followed by Dr. Alvy Bimler.   Has received chemotherapy with carboplatin, paclitaxel and bevacizumab.  DVT Prophylaxis: SCDs Code Status: Full code Family Communication: Discussed with patient Disposition Plan: Hopefully return home when improved.  Start mobilizing.  Status is: Observation The patient will require care spanning > 2 midnights and should be moved to inpatient because: Anemia requiring blood transfusion, fatigue, electrolyte imbalance    Medications: Scheduled:  sodium chloride   Intravenous Once   Chlorhexidine Gluconate Cloth  6 each Topical Daily   pantoprazole  40 mg Oral Q1200   Continuous:  sodium chloride 50 mL/hr at 12/08/22 7425   ZDG:[id number] **OR** acetaminophen, alum & mag hydroxide-simeth, HYDROmorphone (DILAUDID) injection, ondansetron (ZOFRAN) IV  Antibiotics: Anti-infectives (From admission, onward)    None       Objective:  Vital Signs  Vitals:   12/07/22 2125 12/08/22 0041 12/08/22 0111 12/08/22 0342  BP: (!) 149/88 (!) 148/85 (!) 145/85 (!) 160/90  Pulse: 88 85 86 79  Resp: '16 15 16 16  '$ Temp: 98.8 F (37.1 C) 98.8 F (37.1 C) 98.4 F (36.9 C) 98.2 F (36.8 C)  TempSrc: Oral Oral Oral Oral  SpO2: 99% 100% 99% 99%    Intake/Output Summary (Last 24 hours) at 12/08/2022 1134 Last data filed at 12/07/2022 2057 Gross per 24 hour  Intake 375 ml  Output 1000 ml  Net -625 ml    There were no vitals filed for this visit.  General appearance: Awake alert.  In no distress Resp: Clear to auscultation bilaterally.  Normal effort Cardio: S1-S2 is normal regular.  No S3-S4.  No rubs murmurs or bruit GI: Abdomen is  soft.  Nontender nondistended.  Bowel sounds are present normal.  No masses organomegaly Extremities: No edema.  Full range of motion of lower extremities. Neurologic: Alert and oriented x3.  No focal neurological deficits.     Lab Results:  Data Reviewed: I have personally reviewed following labs and reports of the imaging  studies  CBC: Recent Labs  Lab 12/06/22 1230 12/07/22 0636 12/08/22 0500  WBC 5.2 4.1 4.2  NEUTROABS 4.4  --   --   HGB 7.7* 6.5* 8.5*  HCT 23.1* 20.4* 26.3*  MCV 88.8 93.6 92.3  PLT 312 231 193     Basic Metabolic Panel: Recent Labs  Lab 12/06/22 1230 12/07/22 0636 12/08/22 0500  NA 134* 136 134*  K 3.4* 3.2* 3.9  CL 96* 102 105  CO2 '31 27 25  '$ GLUCOSE 135* 104* 88  BUN '17 14 11  '$ CREATININE 0.84 0.91 0.79  CALCIUM 9.7 8.4* 8.6*  MG  --  1.5* 2.2     GFR: Estimated Creatinine Clearance: 82.2 mL/min (by C-G formula based on SCr of 0.79 mg/dL).  Liver Function Tests: Recent Labs  Lab 12/06/22 1230 12/07/22 0636 12/08/22 0500  AST 38 44* 47*  ALT 65* 72* 85*  ALKPHOS 74 63 61  BILITOT 0.5 0.5 1.0  PROT 7.1 5.9* 5.9*  ALBUMIN 3.7 3.0* 2.8*      Radiology Studies: No results found.     LOS: 0 days   Orlan Aversa [employer] on [email or web address]  12/08/2022, 11:34 AM

## 2022-12-08 NOTE — Plan of Care (Signed)
Patient AOX4, VSS throughout shift. All meds given on time as ordered. Pt c/o pain relieved by PRN dilaudid. Diminished lungs, IS encouraged. Labs drawn off port. Pt given 2 units PRBCs and tolerated it well.   POC maintained, will continue to monitor.   Problem: Education: Goal: Knowledge of General Education information will improve Description: Including pain rating scale, medication(s)/side effects and non-pharmacologic comfort measures Outcome: Progressing   Problem: Health Behavior/Discharge Planning: Goal: Ability to manage health-related needs will improve Outcome: Progressing   Problem: Clinical Measurements: Goal: Ability to maintain clinical measurements within normal limits will improve Outcome: Progressing Goal: Will remain free from infection Outcome: Progressing Goal: Diagnostic test results will improve Outcome: Progressing Goal: Respiratory complications will improve Outcome: Progressing Goal: Cardiovascular complication will be avoided Outcome: Progressing   Problem: Activity: Goal: Risk for activity intolerance will decrease Outcome: Progressing   Problem: Nutrition: Goal: Adequate nutrition will be maintained Outcome: Progressing   Problem: Coping: Goal: Level of anxiety will decrease Outcome: Progressing   Problem: Elimination: Goal: Will not experience complications related to bowel motility Outcome: Progressing Goal: Will not experience complications related to urinary retention Outcome: Progressing   Problem: Pain Managment: Goal: General experience of comfort will improve Outcome: Progressing   Problem: Safety: Goal: Ability to remain free from injury will improve Outcome: Progressing   Problem: Skin Integrity: Goal: Risk for impaired skin integrity will decrease Outcome: Progressing

## 2022-12-09 DIAGNOSIS — R112 Nausea with vomiting, unspecified: Secondary | ICD-10-CM | POA: Diagnosis not present

## 2022-12-09 DIAGNOSIS — C530 Malignant neoplasm of endocervix: Secondary | ICD-10-CM | POA: Diagnosis not present

## 2022-12-09 LAB — TYPE AND SCREEN
ABO/RH(D): O POS
Antibody Screen: NEGATIVE
Unit division: 0
Unit division: 0

## 2022-12-09 LAB — COMPREHENSIVE METABOLIC PANEL
ALT: 96 U/L — ABNORMAL HIGH (ref 0–44)
AST: 43 U/L — ABNORMAL HIGH (ref 15–41)
Albumin: 3.2 g/dL — ABNORMAL LOW (ref 3.5–5.0)
Alkaline Phosphatase: 72 U/L (ref 38–126)
Anion gap: 8 (ref 5–15)
BUN: 13 mg/dL (ref 6–20)
CO2: 24 mmol/L (ref 22–32)
Calcium: 8.9 mg/dL (ref 8.9–10.3)
Chloride: 102 mmol/L (ref 98–111)
Creatinine, Ser: 0.91 mg/dL (ref 0.44–1.00)
GFR, Estimated: >60 mL/min (ref >60)
Glucose, Bld: 105 mg/dL — ABNORMAL HIGH (ref 70–99)
Potassium: 3.5 mmol/L (ref 3.5–5.1)
Sodium: 134 mmol/L — ABNORMAL LOW (ref 135–145)
Total Bilirubin: 0.6 mg/dL (ref 0.3–1.2)
Total Protein: 6.8 g/dL (ref 6.5–8.1)

## 2022-12-09 LAB — BPAM RBC
Blood Product Expiration Date: 202401252359
Blood Product Expiration Date: 202401262359
ISSUE DATE / TIME: 202312281625
ISSUE DATE / TIME: 202312290026
Unit Type and Rh: 5100
Unit Type and Rh: 5100

## 2022-12-09 LAB — CBC
HCT: 29.3 % — ABNORMAL LOW (ref 36.0–46.0)
Hemoglobin: 9.7 g/dL — ABNORMAL LOW (ref 12.0–15.0)
MCH: 30.4 pg (ref 26.0–34.0)
MCHC: 33.1 g/dL (ref 30.0–36.0)
MCV: 91.8 fL (ref 80.0–100.0)
Platelets: 193 10*3/uL (ref 150–400)
RBC: 3.19 MIL/uL — ABNORMAL LOW (ref 3.87–5.11)
RDW: 18.4 % — ABNORMAL HIGH (ref 11.5–15.5)
WBC: 4.6 10*3/uL (ref 4.0–10.5)
nRBC: 0 % (ref 0.0–0.2)

## 2022-12-09 LAB — HEPATITIS PANEL, ACUTE
HCV Ab: NONREACTIVE
Hep A IgM: NONREACTIVE
Hep B C IgM: NONREACTIVE
Hepatitis B Surface Ag: NONREACTIVE

## 2022-12-09 MED ORDER — PANTOPRAZOLE SODIUM 40 MG PO TBEC: 40.0000 mg | DELAYED_RELEASE_TABLET | Freq: Every day | ORAL | 0 refills | Status: DC

## 2022-12-09 MED ORDER — ALUM & MAG HYDROXIDE-SIMETH 200-200-20 MG/5ML PO SUSP: 15.0000 mL | Freq: Four times a day (QID) | ORAL | 0 refills | Status: DC | PRN

## 2022-12-09 MED ORDER — HEPARIN SOD (PORK) LOCK FLUSH 100 UNIT/ML IV SOLN
500.0000 [IU] | INTRAVENOUS | Status: DC | PRN
  Filled 2022-12-09: qty 5

## 2022-12-09 NOTE — Discharge Summary (Signed)
Physician Discharge Summary  Kelly Hanson EXH:[id number] DOB: [birth year] DOA: 12/06/2022  PCP: [name], MD  Admit date: 12/06/2022 Discharge date: 12/09/2022  Admitted From: Home Disposition: Home  Recommendations for Outpatient Follow-up:  Follow up with PCP in 1 week with repeat CBC/BMP Outpatient follow-up with oncology Follow up in ED if symptoms worsen or new appear   Home Health: No Equipment/Devices: None  Discharge Condition: Stable CODE STATUS: Full Diet recommendation: Heart healthy/soft diet  Brief/Interim Summary: 61 y.o. female with medical history significant for cervical cancer metastatic to bone who was seen in the cancer center on the day of admission for severe nausea and vomiting with weight loss and inability to take her oral medications.  She was given IV antiemetics and IV analgesia at the cancer center without much improvement.  She was subsequently hospitalized.  During the hospitalization, her condition has gradually improved.  Her diet has been advanced and she is tolerating it.  Her hemoglobin has remained stable after 2 units packed red cell transfusion.  She feels okay to go home today.  Discharge patient home today with close outpatient follow-up with PCP and oncology.  Discharge Diagnoses:   Intractable nausea and vomiting Possibly due to chemotherapy.  Did not improve with outpatient management.  Was hospitalized.  Seems to be improving.  Tolerating soft diet.  Feels okay to go home today.   -Discharge patient home today with close outpatient follow-up with PCP and oncology.  Normocytic anemia/acute blood loss anemia She did mention passing some blood in her urine a few days ago.  Could have had vaginal bleeding from her cervical cancer.  Does not have any active bleeding currently.   She was transfused 2 units of PRBC during this hospitalization for hemoglobin of 6.5.  Hemoglobin 9.7 today.  Outpatient follow-up.   Hypokalemia and  hypomagnesemia Improved  Mildly elevated LFTs Etiology unclear.  Could be medication effect.  Could be due to acute illness.  LFTs are stable for the most part.   -Right upper quadrant ultrasound showed mild gallbladder wall thickening but no gallstones or findings of acute cholecystitis with no biliary dilatation.  No definite hepatic metastases signs  -Outpatient follow-up  history of cervical cancer with cancer associated pain Followed by Dr. Alvy Bimler.  Has received chemotherapy with carboplatin, paclitaxel and bevacizumab.  Outpatient follow-up with oncology  Hyponatremia -Mild.  Outpatient follow-up  Obesity -Outpatient follow-up   Discharge Instructions  Discharge Instructions     Diet - low sodium heart healthy   Complete by: As directed    Soft diet   Increase activity slowly   Complete by: As directed       Allergies as of 12/09/2022   No Known Allergies      Medication List     TAKE these medications    acetaminophen 325 MG tablet Commonly known as: TYLENOL Take 650 mg by mouth every 6 (six) hours as needed for moderate pain.   ALPRAZolam 0.5 MG tablet Commonly known as: XANAX Take 0.5 mg by mouth at bedtime as needed for anxiety.   alum & mag hydroxide-simeth 200-200-20 MG/5ML suspension Commonly known as: MAALOX/MYLANTA Take 15 mLs by mouth every 6 (six) hours as needed for indigestion or heartburn.   cyclobenzaprine 5 MG tablet Commonly known as: FLEXERIL Take 1 tablet (5 mg total) by mouth 3 (three) times daily as needed for muscle spasms.   dexamethasone 4 MG tablet Commonly known as: DECADRON Take 2 tabs the night before and 2 tab the  morning of chemotherapy, every 3 weeks, by mouth x 6 cycles What changed:  how much to take how to take this when to take this   diphenhydramine-acetaminophen 25-500 MG Tabs tablet Commonly known as: TYLENOL PM Take 1 tablet by mouth at bedtime as needed (sleep).   escitalopram 5 MG tablet Commonly  known as: LEXAPRO Take 1 tablet (5 mg total) by mouth daily.   HYDROmorphone 8 MG tablet Commonly known as: Dilaudid Take 1 tablet (8 mg total) by mouth every 4 (four) hours as needed for severe pain.   levothyroxine 88 MCG tablet Commonly known as: SYNTHROID Take 1 tablet (88 mcg total) by mouth daily.   lidocaine-prilocaine cream Commonly known as: EMLA Apply to affected area once   morphine 30 MG 12 hr tablet Commonly known as: MS CONTIN Take 1 tablet (30 mg total) by mouth every 12 (twelve) hours.   ondansetron 8 MG tablet Commonly known as: Zofran Take 1 tablet (8 mg total) by mouth every 8 (eight) hours as needed for nausea or vomiting. Start on the third day after chemotherapy.   pantoprazole 40 MG tablet Commonly known as: PROTONIX Take 1 tablet (40 mg total) by mouth daily at 12 noon.   prochlorperazine 10 MG tablet Commonly known as: COMPAZINE Take 1 tablet (10 mg total) by mouth every 6 (six) hours as needed for nausea or vomiting.         Follow-up Information     [name], MD. Schedule an appointment as soon as possible for a visit in 1 week(s).   Specialty: Family Medicine Contact information: [address] [place] [address] [phone]         Heath Lark, MD. Schedule an appointment as soon as possible for a visit in 1 week(s).   Specialty: Hematology and Oncology Contact information: [place] [address] [phone]                No Known Allergies  Consultations: None   Procedures/Studies: US Abdomen Limited RUQ (LIVER/GB)  Result Date: 12/08/2022 CLINICAL DATA:  Abnormal liver function studies. History of metastatic uterine/cervical cancer. EXAM: ULTRASOUND ABDOMEN LIMITED RIGHT UPPER QUADRANT COMPARISON:  PET-CT 09/25/2022 FINDINGS: Gallbladder: The gallbladder is partially contracted. Wall thickening measuring up to 3.5 mm. No gallstones or gallbladder sludge. No  pericholecystic fluid. Negative sonographic Murphy sign. Common bile duct: Diameter: 3.0 mm Liver: Coarse liver echotexture and extrinsic mass noted at the right hepatic dome correlating with a chest wall mass seen on the prior PET-CT. No definite hepatic metastatic lesions and no intrahepatic biliary dilatation. Portal vein is patent on color Doppler imaging with normal direction of blood flow towards the liver. Other: None. IMPRESSION: 1. Coarse liver echotexture and extrinsic mass at the right hepatic dome. No definite hepatic metastatic lesions. 2. Mild gallbladder wall thickening but no gallstones or findings for acute cholecystitis. 3. No biliary dilatation. Electronically Signed   By: Marijo Sanes M.D.   On: 12/08/2022 15:20      Subjective: Patient seen and examined at bedside.  Feels much better, tolerating diet.  Wants to go home today.  No fever, worsening shortness of breath reported.  Discharge Exam: Vitals:   12/08/22 2050 12/09/22 0502  BP: 138/87 138/83  Pulse: 93 88  Resp: 16 16  Temp: 98.7 F (37.1 C) 98.3 F (36.8 C)  SpO2: 98% 98%    General: Pt is alert, awake, not in acute distress Cardiovascular: rate controlled, S1/S2 + Respiratory: bilateral  decreased breath sounds at bases Abdominal: Soft, obese, NT, ND, bowel sounds + Extremities: no edema, no cyanosis    The results of significant diagnostics from this hospitalization (including imaging, microbiology, ancillary and laboratory) are listed below for reference.     Microbiology: No results found for this or any previous visit (from the past 240 hour(s)).   Labs: BNP (last 3 results) No results for input(s): "BNP" in the last 8760 hours. Basic Metabolic Panel: Recent Labs  Lab 12/06/22 1230 12/07/22 0636 12/08/22 0500 12/09/22 0749  NA 134* 136 134* 134*  K 3.4* 3.2* 3.9 3.5  CL 96* 102 105 102  CO2 '31 27 25 24  '$ GLUCOSE 135* 104* 88 105*  BUN '17 14 11 13  '$ CREATININE 0.84 0.91 0.79 0.91   CALCIUM 9.7 8.4* 8.6* 8.9  MG  --  1.5* 2.2  --    Liver Function Tests: Recent Labs  Lab 12/06/22 1230 12/07/22 0636 12/08/22 0500 12/09/22 0749  AST 38 44* 47* 43*  ALT 65* 72* 85* 96*  ALKPHOS 74 63 61 72  BILITOT 0.5 0.5 1.0 0.6  PROT 7.1 5.9* 5.9* 6.8  ALBUMIN 3.7 3.0* 2.8* 3.2*   No results for input(s): "LIPASE", "AMYLASE" in the last 168 hours. No results for input(s): "AMMONIA" in the last 168 hours. CBC: Recent Labs  Lab 12/06/22 1230 12/07/22 0636 12/08/22 0500 12/09/22 0749  WBC 5.2 4.1 4.2 4.6  NEUTROABS 4.4  --   --   --   HGB 7.7* 6.5* 8.5* 9.7*  HCT 23.1* 20.4* 26.3* 29.3*  MCV 88.8 93.6 92.3 91.8  PLT 312 231 193 193   Cardiac Enzymes: No results for input(s): "CKTOTAL", "CKMB", "CKMBINDEX", "TROPONINI" in the last 168 hours. BNP: Invalid input(s): "POCBNP" CBG: No results for input(s): "GLUCAP" in the last 168 hours. D-Dimer No results for input(s): "DDIMER" in the last 72 hours. Hgb A1c No results for input(s): "HGBA1C" in the last 72 hours. Lipid Profile No results for input(s): "CHOL", "HDL", "LDLCALC", "TRIG", "CHOLHDL", "LDLDIRECT" in the last 72 hours. Thyroid function studies No results for input(s): "TSH", "T4TOTAL", "T3FREE", "THYROIDAB" in the last 72 hours.  Invalid input(s): "FREET3" Anemia work up No results for input(s): "VITAMINB12", "FOLATE", "FERRITIN", "TIBC", "IRON", "RETICCTPCT" in the last 72 hours. Urinalysis    Component Value Date/Time   COLORURINE YELLOW 11/09/2022 1224   APPEARANCEUR CLOUDY (A) 11/09/2022 1224   LABSPEC 1.010 11/09/2022 1224   PHURINE 7.0 11/09/2022 1224   GLUCOSEU NEGATIVE 11/09/2022 1224   HGBUR LARGE (A) 11/09/2022 1224   BILIRUBINUR NEGATIVE 11/09/2022 1224   KETONESUR NEGATIVE 11/09/2022 1224   PROTEINUR 300 (A) 11/30/2022 1033   NITRITE NEGATIVE 11/09/2022 1224   LEUKOCYTESUR LARGE (A) 11/09/2022 1224   Sepsis Labs Recent Labs  Lab 12/06/22 1230 12/07/22 0636 12/08/22 0500  12/09/22 0749  WBC 5.2 4.1 4.2 4.6   Microbiology No results found for this or any previous visit (from the past 240 hour(s)).   Time coordinating discharge: 35 minutes  SIGNED:   Aline August, MD  [facility] 12/09/2022, 10:49 AM

## 2022-12-09 NOTE — Plan of Care (Signed)
Patient sleeping, rouses easily, states that her pain wanes with medication but does not go away, purewick in place draining cloudy urine.  Plan for evening to encourage rest by using purewick and pain meds.

## 2022-12-13 ENCOUNTER — Telehealth: Payer: Self-pay | Admitting: Radiology

## 2022-12-13 NOTE — Telephone Encounter (Signed)
[id number]: A RANDOMIZED TRIAL ADDRESSING CANCER-RELATED FINANCIAL HARDSHIP THROUGH DELIVERY OF A PROACTIVE FINANCIAL NAVIGATION INTERVENTION (CREDIT)   12/13/22  PHONE CALL: Confirmed I was speaking with Kelly Hanson . Informed patient reason for call is to follow-up on the above mentioned study. Patient has been very sick since last chemo treatment and has not had a chance to review. Will follow-up at a later date. Thanked patient for her time and consideration of the above mentioned study.   Carol Ada, RT(R)(T) Clinical Research Coordinator

## 2022-12-14 ENCOUNTER — Ambulatory Visit: Payer: BC Managed Care – PPO | Admitting: Hematology and Oncology

## 2022-12-14 ENCOUNTER — Encounter (HOSPITAL_COMMUNITY)
Admission: RE | Admit: 2022-12-14 | Discharge: 2022-12-14 | Disposition: A | Discharge status: Home / Self Care | Payer: BC Managed Care – PPO | Source: Ambulatory Visit | Attending: Hematology and Oncology | Admitting: Hematology and Oncology

## 2022-12-14 DIAGNOSIS — M899 Disorder of bone, unspecified: Secondary | ICD-10-CM | POA: Insufficient documentation

## 2022-12-14 DIAGNOSIS — C539 Malignant neoplasm of cervix uteri, unspecified: Secondary | ICD-10-CM | POA: Insufficient documentation

## 2022-12-14 DIAGNOSIS — R222 Localized swelling, mass and lump, trunk: Secondary | ICD-10-CM | POA: Insufficient documentation

## 2022-12-14 DIAGNOSIS — N1330 Unspecified hydronephrosis: Secondary | ICD-10-CM | POA: Insufficient documentation

## 2022-12-14 DIAGNOSIS — R930 Abnormal findings on diagnostic imaging of skull and head, not elsewhere classified: Secondary | ICD-10-CM | POA: Insufficient documentation

## 2022-12-14 DIAGNOSIS — N261 Atrophy of kidney (terminal): Secondary | ICD-10-CM | POA: Diagnosis not present

## 2022-12-14 DIAGNOSIS — R911 Solitary pulmonary nodule: Secondary | ICD-10-CM | POA: Insufficient documentation

## 2022-12-14 DIAGNOSIS — C7951 Secondary malignant neoplasm of bone: Secondary | ICD-10-CM | POA: Diagnosis not present

## 2022-12-14 LAB — GLUCOSE, CAPILLARY: Glucose-Capillary: 122 mg/dL — ABNORMAL HIGH (ref 70–99)

## 2022-12-14 MED ORDER — FLUDEOXYGLUCOSE F - 18 (FDG) INJECTION
9.2000 | Freq: Once | INTRAVENOUS | Status: AC | PRN
  Administered 2022-12-14: 9.6 via INTRAVENOUS

## 2022-12-15 ENCOUNTER — Encounter: Payer: Self-pay | Admitting: Hematology and Oncology

## 2022-12-15 ENCOUNTER — Other Ambulatory Visit: Payer: Self-pay

## 2022-12-15 ENCOUNTER — Inpatient Hospital Stay: Payer: BC Managed Care – PPO | Attending: Gynecologic Oncology | Admitting: Hematology and Oncology

## 2022-12-15 ENCOUNTER — Telehealth: Payer: Self-pay

## 2022-12-15 ENCOUNTER — Other Ambulatory Visit (HOSPITAL_COMMUNITY): Payer: Self-pay

## 2022-12-15 VITALS — BP 125/85 | HR 106 | Temp 98.1°F | Resp 18 | Ht 66.0 in | Wt 189.2 lb

## 2022-12-15 DIAGNOSIS — Z923 Personal history of irradiation: Secondary | ICD-10-CM | POA: Insufficient documentation

## 2022-12-15 DIAGNOSIS — Z9221 Personal history of antineoplastic chemotherapy: Secondary | ICD-10-CM | POA: Diagnosis not present

## 2022-12-15 DIAGNOSIS — C539 Malignant neoplasm of cervix uteri, unspecified: Secondary | ICD-10-CM | POA: Insufficient documentation

## 2022-12-15 DIAGNOSIS — C530 Malignant neoplasm of endocervix: Secondary | ICD-10-CM | POA: Diagnosis not present

## 2022-12-15 DIAGNOSIS — Z79899 Other long term (current) drug therapy: Secondary | ICD-10-CM | POA: Diagnosis not present

## 2022-12-15 DIAGNOSIS — G893 Neoplasm related pain (acute) (chronic): Secondary | ICD-10-CM | POA: Diagnosis not present

## 2022-12-15 DIAGNOSIS — Z7189 Other specified counseling: Secondary | ICD-10-CM

## 2022-12-15 DIAGNOSIS — C7951 Secondary malignant neoplasm of bone: Secondary | ICD-10-CM | POA: Diagnosis not present

## 2022-12-15 DIAGNOSIS — Z7952 Long term (current) use of systemic steroids: Secondary | ICD-10-CM | POA: Insufficient documentation

## 2022-12-15 DIAGNOSIS — Z5111 Encounter for antineoplastic chemotherapy: Secondary | ICD-10-CM | POA: Diagnosis not present

## 2022-12-15 DIAGNOSIS — C7800 Secondary malignant neoplasm of unspecified lung: Secondary | ICD-10-CM | POA: Diagnosis not present

## 2022-12-15 MED ORDER — DEXAMETHASONE 4 MG PO TABS
4.0000 mg | ORAL_TABLET | Freq: Every day | ORAL | 6 refills | Status: DC
  Filled 2022-12-15: qty 30, 30d supply, fill #0

## 2022-12-15 MED ORDER — MORPHINE SULFATE ER 60 MG PO TBCR
60.0000 mg | EXTENDED_RELEASE_TABLET | Freq: Two times a day (BID) | ORAL | 0 refills | Status: DC
  Filled 2022-12-15: qty 60, 30d supply, fill #0

## 2022-12-15 MED ORDER — HYDROMORPHONE HCL 8 MG PO TABS
8.0000 mg | ORAL_TABLET | ORAL | 0 refills | Status: DC | PRN
  Filled 2022-12-15: qty 53, 9d supply, fill #0

## 2022-12-15 NOTE — Progress Notes (Signed)
[facility] OFFICE PROGRESS NOTE  Patient Care Team: [name], MD as PCP - General (Family Medicine)  ASSESSMENT & PLAN:  Cervical cancer [facility] [place]) I have reviewed multiple chain studies with the patient and her husband Unfortunately, she has signs of disease progression Most concerning findings on her PET/CT imaging is that she might have new brain lesion We discussed the role of MRI and additional imaging modality We discussed treatment options in general She tolerated chemotherapy very poorly Molecular study only indicated 1% positive score; it is not likely that immunotherapy will be helpful in the situation Ultimately, the patient elected for supportive care with palliative care and hospice She declined further imaging study I recommend referral for home-based palliative care and hospice  Cancer associated pain Her pain is suboptimally controlled I will increase the dose of MS Contin I refilled both prescription MS Contin and Dilaudid I also recommend low-dose dexamethasone to help with bone pain  Goals of care, counseling/discussion We have long discussion about goals of care She would like to focus on quality of life She is aware of poor prognosis She agreed for home-based palliative care with hospice We discussed CODE STATUS and she is leading to its DO NOT RESUSCITATE I gave her a DNR form as well as MOST form for her to consider at home  No orders of the defined types were placed in this encounter.   All questions were answered. The patient knows to call the clinic with any problems, questions or concerns. The total time spent in the appointment was 40 minutes encounter with patients including review of chart and various tests results, discussions about plan of care and coordination of care plan   Heath Lark, MD 12/15/2022 2:31 PM  INTERVAL HISTORY: Please see below for problem oriented charting. she returns for treatment follow-up with her  husband She was recently hospitalized for nausea and dehydration She felt better Her back pain is suboptimally controlled We spent majority of our time reviewing test results and discussed options  REVIEW OF SYSTEMS:   Constitutional: Denies fevers, chills or abnormal weight loss Eyes: Denies blurriness of vision Ears, nose, mouth, throat, and face: Denies mucositis or sore throat Respiratory: Denies cough, dyspnea or wheezes Cardiovascular: Denies palpitation, chest discomfort or lower extremity swelling Gastrointestinal:  Denies nausea, heartburn or change in bowel habits Skin: Denies abnormal skin rashes Lymphatics: Denies new lymphadenopathy or easy bruising Behavioral/Psych: Mood is stable, no new changes  All other systems were reviewed with the patient and are negative.  I have reviewed the past medical history, past surgical history, social history and family history with the patient and they are unchanged from previous note.  ALLERGIES:  has No Known Allergies.  MEDICATIONS:  Current Outpatient Medications  Medication Sig Dispense Refill   acetaminophen (TYLENOL) 325 MG tablet Take 650 mg by mouth every 6 (six) hours as needed for moderate pain.      ALPRAZolam (XANAX) 0.5 MG tablet Take 0.5 mg by mouth at bedtime as needed for anxiety.     alum & mag hydroxide-simeth (MAALOX/MYLANTA) 200-200-20 MG/5ML suspension Take 15 mLs by mouth every 6 (six) hours as needed for indigestion or heartburn. 355 mL 0   cyclobenzaprine (FLEXERIL) 5 MG tablet Take 1 tablet (5 mg total) by mouth 3 (three) times daily as needed for muscle spasms. 30 tablet 0   dexamethasone (DECADRON) 4 MG tablet Take 1 tablet (4 mg total) by mouth daily. 30 tablet 6   diphenhydramine-acetaminophen (TYLENOL PM)  25-500 MG TABS tablet Take 1 tablet by mouth at bedtime as needed (sleep).      escitalopram (LEXAPRO) 5 MG tablet Take 1 tablet (5 mg total) by mouth daily. 30 tablet 1   HYDROmorphone (DILAUDID) 8 MG  tablet Take 1 tablet (8 mg total) by mouth every 4 (four) hours as needed for severe pain. 90 tablet 0   levothyroxine (SYNTHROID) 88 MCG tablet Take 1 tablet (88 mcg total) by mouth daily. 30 tablet 1   lidocaine-prilocaine (EMLA) cream Apply to affected area once 30 g 3   morphine (MS CONTIN) 60 MG 12 hr tablet Take 1 tablet (60 mg total) by mouth every 12 (twelve) hours. 60 tablet 0   ondansetron (ZOFRAN) 8 MG tablet Take 1 tablet (8 mg total) by mouth every 8 (eight) hours as needed for nausea or vomiting. Start on the third day after chemotherapy. 30 tablet 1   pantoprazole (PROTONIX) 40 MG tablet Take 1 tablet (40 mg total) by mouth daily at 12 noon. 30 tablet 0   prochlorperazine (COMPAZINE) 10 MG tablet Take 1 tablet (10 mg total) by mouth every 6 (six) hours as needed for nausea or vomiting. 30 tablet 1   No current facility-administered medications for this visit.    SUMMARY OF ONCOLOGIC HISTORY: Oncology History Overview Note  PD-L1 CPS 1%   Cervical cancer ([place])  02/09/2020 Initial Diagnosis   Between 6-8 months ago, the patient endorses having irregular spotting intermittnetly every few weeks that would last for a few days. This did not change over time and she describes it as light spotting. This was the first bleeding she had after menopause. She ultimately saw her PCP who performed a pap in March which returned showing HSIL. The patient tells me today that this is the first pap test she has had ever. She was then referred to gyn and underwent EMB/LEEP and transvaginal ultrasound for work-up of her PMB and high grade cervical dysplasia. Unfortunately, her pathology revealed SCC of the cervix   05/03/2020 Pathology Results   Endocervical curettings show fragments of squamous epithelium with high-grade squamous intraepithelial lesion, CIN-3 as well as fragments of invasive squamous cell carcinoma within the endometrial biopsy.  The invasive squamous cell carcinoma have depth of invasion  more than 3 mm, with at least 10 mm, thickness almost 1.2 mm.   05/18/2020 Imaging   1. No findings of metastatic disease to the abdomen/pelvis. 2. There is a small amount of gas either along the cervical canal or the adjacent fornix. A well-defined mass is not seen. 3. Chronic bilateral pars defects at L5 with 7 mm grade 1 anterolisthesis of L5 on S1 and resulting mild left foraminal stenosis. 4. Suspected right foraminal disc protrusion at L3-4 possibly causing mild right foraminal impingement.     05/21/2020 Cancer Staging   Staging form: Cervix Uteri, AJCC Version 9 - Clinical stage from 05/21/2020: FIGO Stage IVB (rcT4, cN0, cM1) - Signed by Heath Lark, MD on 09/28/2022 Stage prefix: Recurrence   05/26/2020 PET scan   1. Intensely hypermetabolic circumferential metabolic activity in the uterine cervix consistent with cervical carcinoma. 2. No evidence of metastatic disease in the pelvis. No metastatic lymphadenopathy. 3. No evidence of distant metastatic disease. 4. Focus of metabolic activity in the gastric antrum would be unlikely location for a cervical carcinoma metastasis. Favor gastritis or potentially primary gastric neoplasm. Consider upper endoscopy for further evaluation. 5. Diffuse activity in the thyroid gland is favored thyroiditis.   06/03/2020 Procedure  Status post right IJ port catheter.   06/07/2020 - 07/07/2020 Chemotherapy   The patient had weekly cisplatin for chemotherapy treatment.     06/07/2020 - 08/23/2020 Radiation Therapy    Radiation Treatment Dates: 06/07/2020 through 07/19/2020 Site Technique Total Dose (Gy) Dose per Fx (Gy) Completed Fx Beam Energies  Cervix: Cervix IMRT 45/45 1.8 25/25 6X  Cervix: Cervix_Bst 3D 9/9 1.8 5/5 15X    HDR brachytherapy: 07/26/2020, 08/06/2020, 08/09/2020, 08/17/2020, 08/23/2020     11/22/2020 PET scan   Marked interval response to therapy with no visible mass at the site of previous ill-defined thickening of the cervix,  and marked interval decrease in FDG uptake associated with this area as outlined above.   No signs of new disease in the neck, chest, abdomen or of the pelvis   Markedly hypermetabolic thyroid gland likely related to thyroiditis is minimally changed, correlate with thyroid function. Given asymmetric enlargement of the RIGHT thyroid lobe would also suggest thyroid ultrasound for further evaluation.     12/15/2020 Procedure   Removal of implanted Port-A-Cath utilizing sharp and blunt dissection. The procedure was uncomplicated.     05/24/2021 Pathology Results   Cervical biopsy showed necroinflammatory debris   09/26/2022 PET scan   1. Ill-defined hypermetabolic aggressive masslike soft tissue extending from the posterior lower uterine segment/cervix into the left hemipelvis is consistent with recurrent/metastatic disease. The mass appears to invade a portion of the sigmoid colon and the urinary bladder with gas seen in the central portion of the mass likely reflecting a fistula. 2. Hypermetabolic right-sided pulmonary and pleural metastases. 3. Hypermetabolic osseous metastatic lesions centered at the L3-L4 vertebral body and right seventh rib. 4. Hypermetabolic nodular thickening of the gastric antrum, nonspecific consider correlation with upper endoscopy.  5. Asymmetric hypermetabolic activity along the left glossotonsillar sulcus is nonspecific consider correlation with direct visualization 6. Hypermetabolic 15 mm right thyroid nodule, suggest further evaluation with dedicated thyroid ultrasound. 7. Left hydroureteronephrosis to the level of the pelvic mass with associated cortical renal atrophy.   10/03/2022 Imaging   CT pelvis 1. No signs of patent fistulous communication of the colon with the necrotic left pelvic mass, urinary bladder or uterus. 2. Similar appearance of large centrally necrotic mass involving the left side of uterus, left vaginal apex, left pelvic sidewall and left  posterolateral bladder. 3. High attenuation material is identified within the left posterior bladder near the UVJ which was present on the examination from 09/25/2022. 4. Bilateral L5 pars defects with anterolisthesis of L5 on S1. 5.  Aortic Atherosclerosis (ICD10-I70.0).   10/03/2022 Imaging   Renal perfusion study  Normal RIGHT renogram.   No functional LEFT kidney/renal tissue identified.   10/18/2022 Procedure   Placement of a subcutaneous power-injectable port device. Catheter tip at the superior cavoatrial junction.   10/19/2022 - 11/30/2022 Chemotherapy   Patient is on Treatment Plan : Cervical cancer Carboplatin + Paclitaxel + Bevacizumab q21d      12/15/2022 PET scan   1. Ill-defined hypermetabolic masslike soft tissue extending from the lower uterus/cervix is similar in size when compared with prior exam, but demonstrates increased FDG avidity. 2. New area of abnormal uptake centered about the left basal ganglia with peripheral hypometabolism, concerning for metastatic disease, but could also be artifactual related to patient positioning. Recommend contrast-enhanced brain MRI for further evaluation. 3. Hypermetabolic osseous lesion centered at the right T3-T4 vertebral bodies and right seventh rib is increased in size with increased encroachment on the spinal canal, but demonstrates decreased  hypermetabolic activity. Recommend contrast-enhanced MRI of the thoracic spine for further evaluation. 4. Right chest wall lesion is similar in size, but demonstrates decreased FDG activity. 5. Unchanged hypermetabolic pulmonary and pleural nodules. 6. Unchanged moderate left hydroureteronephrosis to the level of the pelvic mass with associated left renal atrophy.     PHYSICAL EXAMINATION: ECOG PERFORMANCE STATUS: 2 - Symptomatic, <50% confined to bed  Vitals:   12/15/22 1357  BP: 125/85  Pulse: (!) 106  Resp: 18  Temp: 98.1 F (36.7 C)  SpO2: 98%   Filed Weights   12/15/22 1357   Weight: 189 lb 3.2 oz (85.8 kg)    GENERAL:alert, no distress and comfortable NEURO: alert & oriented x 3 with fluent speech, no focal motor/sensory deficits  LABORATORY DATA:  I have reviewed the data as listed    Component Value Date/Time   NA 134 (L) 12/09/2022 0749   K 3.5 12/09/2022 0749   CL 102 12/09/2022 0749   CO2 24 12/09/2022 0749   GLUCOSE 105 (H) 12/09/2022 0749   BUN 13 12/09/2022 0749   CREATININE 0.91 12/09/2022 0749   CREATININE 0.84 12/06/2022 1230   CALCIUM 8.9 12/09/2022 0749   PROT 6.8 12/09/2022 0749   ALBUMIN 3.2 (L) 12/09/2022 0749   AST 43 (H) 12/09/2022 0749   AST 38 12/06/2022 1230   ALT 96 (H) 12/09/2022 0749   ALT 65 (H) 12/06/2022 1230   ALKPHOS 72 12/09/2022 0749   BILITOT 0.6 12/09/2022 0749   BILITOT 0.5 12/06/2022 1230   GFRNONAA >60 12/09/2022 0749   GFRNONAA >60 12/06/2022 1230   GFRAA >60 07/05/2020 1202    No results found for: "SPEP", "UPEP"  Lab Results  Component Value Date   WBC 4.6 12/09/2022   NEUTROABS 4.4 12/06/2022   HGB 9.7 (L) 12/09/2022   HCT 29.3 (L) 12/09/2022   MCV 91.8 12/09/2022   PLT 193 12/09/2022      Chemistry      Component Value Date/Time   NA 134 (L) 12/09/2022 0749   K 3.5 12/09/2022 0749   CL 102 12/09/2022 0749   CO2 24 12/09/2022 0749   BUN 13 12/09/2022 0749   CREATININE 0.91 12/09/2022 0749   CREATININE 0.84 12/06/2022 1230      Component Value Date/Time   CALCIUM 8.9 12/09/2022 0749   ALKPHOS 72 12/09/2022 0749   AST 43 (H) 12/09/2022 0749   AST 38 12/06/2022 1230   ALT 96 (H) 12/09/2022 0749   ALT 65 (H) 12/06/2022 1230   BILITOT 0.6 12/09/2022 0749   BILITOT 0.5 12/06/2022 1230       RADIOGRAPHIC STUDIES: I have reviewed multiple imaging studies with the patient and her husband I have personally reviewed the radiological images as listed and agreed with the findings in the report. NM PET Image Restage (PS) Skull Base to Thigh (F-18 FDG)  Result Date: 12/15/2022 CLINICAL  DATA:  Follow-up treatment strategy for cervical cancer. EXAM: NUCLEAR MEDICINE PET SKULL BASE TO THIGH TECHNIQUE: 9.6 mCi F-18 FDG was injected intravenously. Full-ring PET imaging was performed from the skull base to thigh after the radiotracer. CT data was obtained and used for attenuation correction and anatomic localization. Fasting blood glucose: 122 mg/dl COMPARISON:  None Available. FINDINGS: Mediastinal blood pool activity: SUV max 2.7 Liver activity: SUV max 3.5 BRAIN: New area of abnormal uptake centered about the left basal ganglia with peripheral hypometabolism. NECK: Hypermetabolic nodule in the right thyroid lobe, measures approximately 1.5 cm on series 4, image 46  with SUV max of 7.2. Incidental CT findings: None. CHEST: Hypermetabolic pulmonary nodules. For reference, right upper lobe pulmonary nodule measuring up to 14 mm on series 4, image 53 SUV max of 4.3, unchanged in size, previously SUV max was 7.9. Similar size and hypermetabolism of pleural nodules. Reference right pleural nodule measuring 8 mm series 4, image 68 with SUV max of 10.7, previously measured 8 mm with SUV max of 10.0. Incidental CT findings: Right chest wall port with tip in the right atrium. ABDOMEN/PELVIS: Ill-defined hypermetabolic masslike soft tissue extending from the lower uterus/cervix measuring approximately 10.0 x 5.8 cm with SUV max of 24.4, similar size when compared with prior exam, previously SUV max was 14.6. Incidental CT findings: Unchanged moderate left hydroureteronephrosis to the level of the pelvic mass with associated left renal atrophy. SKELETON: Hypermetabolic osseous metastatic lesions. For reference, expansile hypermetabolic lesion of the right anterior chest wall measuring 5.3 x 3.1 cm with SUV max of 12.4 on series 4, image 95, previously 5.2 x 3.5 cm with SUV max of 19.7. For reference, expansile destructive hypermetabolic lesions centered at the T3-T4 vertebral body measures approximately 6.4 x 2.1  cm on series 4, image 57 with SUV max of 14.6, previously measured 5.0 x 1.9 cm with SUV max of 19.4. Incidental CT findings: None. IMPRESSION: 1. Ill-defined hypermetabolic masslike soft tissue extending from the lower uterus/cervix is similar in size when compared with prior exam, but demonstrates increased FDG avidity. 2. New area of abnormal uptake centered about the left basal ganglia with peripheral hypometabolism, concerning for metastatic disease, but could also be artifactual related to patient positioning. Recommend contrast-enhanced brain MRI for further evaluation. 3. Hypermetabolic osseous lesion centered at the right T3-T4 vertebral bodies and right seventh rib is increased in size with increased encroachment on the spinal canal, but demonstrates decreased hypermetabolic activity. Recommend contrast-enhanced MRI of the thoracic spine for further evaluation. 4. Right chest wall lesion is similar in size, but demonstrates decreased FDG activity. 5. Unchanged hypermetabolic pulmonary and pleural nodules. 6. Unchanged moderate left hydroureteronephrosis to the level of the pelvic mass with associated left renal atrophy. Electronically Signed   By: Yetta Glassman M.D.   On: 12/15/2022 09:08   US Abdomen Limited RUQ (LIVER/GB)  Result Date: 12/08/2022 CLINICAL DATA:  Abnormal liver function studies. History of metastatic uterine/cervical cancer. EXAM: ULTRASOUND ABDOMEN LIMITED RIGHT UPPER QUADRANT COMPARISON:  PET-CT 09/25/2022 FINDINGS: Gallbladder: The gallbladder is partially contracted. Wall thickening measuring up to 3.5 mm. No gallstones or gallbladder sludge. No pericholecystic fluid. Negative sonographic Murphy sign. Common bile duct: Diameter: 3.0 mm Liver: Coarse liver echotexture and extrinsic mass noted at the right hepatic dome correlating with a chest wall mass seen on the prior PET-CT. No definite hepatic metastatic lesions and no intrahepatic biliary dilatation. Portal vein is patent  on color Doppler imaging with normal direction of blood flow towards the liver. Other: None. IMPRESSION: 1. Coarse liver echotexture and extrinsic mass at the right hepatic dome. No definite hepatic metastatic lesions. 2. Mild gallbladder wall thickening but no gallstones or findings for acute cholecystitis. 3. No biliary dilatation. Electronically Signed   By: Marijo Sanes M.D.   On: 12/08/2022 15:20

## 2022-12-15 NOTE — Assessment & Plan Note (Signed)
Her pain is suboptimally controlled I will increase the dose of MS Contin I refilled both prescription MS Contin and Dilaudid I also recommend low-dose dexamethasone to help with bone pain

## 2022-12-15 NOTE — Assessment & Plan Note (Signed)
We have long discussion about goals of care She would like to focus on quality of life She is aware of poor prognosis She agreed for home-based palliative care with hospice We discussed CODE STATUS and she is leading to its DO NOT RESUSCITATE I gave her a DNR form as well as MOST form for her to consider at home

## 2022-12-15 NOTE — Assessment & Plan Note (Signed)
I have reviewed multiple chain studies with the patient and her husband Unfortunately, she has signs of disease progression Most concerning findings on her PET/CT imaging is that she might have new brain lesion We discussed the role of MRI and additional imaging modality We discussed treatment options in general She tolerated chemotherapy very poorly Molecular study only indicated 1% positive score; it is not likely that immunotherapy will be helpful in the situation Ultimately, the patient elected for supportive care with palliative care and hospice She declined further imaging study I recommend referral for home-based palliative care and hospice

## 2022-12-15 NOTE — Telephone Encounter (Signed)
Called referral to [facility] and spoke with Bryson Ha. They will contact [place] and husband about getting admitted to Hospice.

## 2022-12-19 ENCOUNTER — Other Ambulatory Visit (HOSPITAL_COMMUNITY): Payer: Self-pay

## 2022-12-19 DIAGNOSIS — E441 Mild protein-calorie malnutrition: Secondary | ICD-10-CM | POA: Diagnosis not present

## 2022-12-19 DIAGNOSIS — Z87891 Personal history of nicotine dependence: Secondary | ICD-10-CM | POA: Diagnosis not present

## 2022-12-19 DIAGNOSIS — E039 Hypothyroidism, unspecified: Secondary | ICD-10-CM | POA: Diagnosis not present

## 2022-12-19 DIAGNOSIS — C7931 Secondary malignant neoplasm of brain: Secondary | ICD-10-CM | POA: Diagnosis not present

## 2022-12-19 DIAGNOSIS — C539 Malignant neoplasm of cervix uteri, unspecified: Secondary | ICD-10-CM | POA: Diagnosis not present

## 2022-12-19 DIAGNOSIS — D649 Anemia, unspecified: Secondary | ICD-10-CM | POA: Diagnosis not present

## 2022-12-19 DIAGNOSIS — C7800 Secondary malignant neoplasm of unspecified lung: Secondary | ICD-10-CM | POA: Diagnosis not present

## 2022-12-19 DIAGNOSIS — M5126 Other intervertebral disc displacement, lumbar region: Secondary | ICD-10-CM | POA: Diagnosis not present

## 2022-12-19 DIAGNOSIS — F419 Anxiety disorder, unspecified: Secondary | ICD-10-CM | POA: Diagnosis not present

## 2022-12-19 DIAGNOSIS — C7982 Secondary malignant neoplasm of genital organs: Secondary | ICD-10-CM | POA: Diagnosis not present

## 2022-12-19 DIAGNOSIS — C7951 Secondary malignant neoplasm of bone: Secondary | ICD-10-CM | POA: Diagnosis not present

## 2022-12-19 MED ORDER — LORAZEPAM 0.5 MG PO TABS
0.5000 mg | ORAL_TABLET | Freq: Three times a day (TID) | ORAL | 0 refills | Status: DC | PRN
  Filled 2022-12-19: qty 6, 2d supply, fill #0

## 2022-12-19 MED ORDER — SENNOSIDES-DOCUSATE SODIUM 8.6-50 MG PO TABS
1.0000 | ORAL_TABLET | Freq: Two times a day (BID) | ORAL | 0 refills | Status: DC | PRN
  Filled 2022-12-19: qty 30, 4d supply, fill #0

## 2022-12-20 DIAGNOSIS — C7951 Secondary malignant neoplasm of bone: Secondary | ICD-10-CM | POA: Diagnosis not present

## 2022-12-20 DIAGNOSIS — C7800 Secondary malignant neoplasm of unspecified lung: Secondary | ICD-10-CM | POA: Diagnosis not present

## 2022-12-20 DIAGNOSIS — Z87891 Personal history of nicotine dependence: Secondary | ICD-10-CM | POA: Diagnosis not present

## 2022-12-20 DIAGNOSIS — C539 Malignant neoplasm of cervix uteri, unspecified: Secondary | ICD-10-CM | POA: Diagnosis not present

## 2022-12-20 DIAGNOSIS — C7931 Secondary malignant neoplasm of brain: Secondary | ICD-10-CM | POA: Diagnosis not present

## 2022-12-20 DIAGNOSIS — D649 Anemia, unspecified: Secondary | ICD-10-CM | POA: Diagnosis not present

## 2022-12-20 DIAGNOSIS — F419 Anxiety disorder, unspecified: Secondary | ICD-10-CM | POA: Diagnosis not present

## 2022-12-20 DIAGNOSIS — E039 Hypothyroidism, unspecified: Secondary | ICD-10-CM | POA: Diagnosis not present

## 2022-12-20 DIAGNOSIS — M5126 Other intervertebral disc displacement, lumbar region: Secondary | ICD-10-CM | POA: Diagnosis not present

## 2022-12-20 DIAGNOSIS — C7982 Secondary malignant neoplasm of genital organs: Secondary | ICD-10-CM | POA: Diagnosis not present

## 2022-12-20 DIAGNOSIS — E441 Mild protein-calorie malnutrition: Secondary | ICD-10-CM | POA: Diagnosis not present

## 2022-12-21 ENCOUNTER — Encounter: Payer: BC Managed Care – PPO | Admitting: Dietician

## 2022-12-21 ENCOUNTER — Ambulatory Visit: Payer: BC Managed Care – PPO

## 2022-12-21 ENCOUNTER — Ambulatory Visit: Payer: BC Managed Care – PPO | Admitting: Hematology and Oncology

## 2022-12-21 ENCOUNTER — Other Ambulatory Visit: Payer: BC Managed Care – PPO

## 2022-12-21 DIAGNOSIS — M5126 Other intervertebral disc displacement, lumbar region: Secondary | ICD-10-CM | POA: Diagnosis not present

## 2022-12-21 DIAGNOSIS — Z87891 Personal history of nicotine dependence: Secondary | ICD-10-CM | POA: Diagnosis not present

## 2022-12-21 DIAGNOSIS — D649 Anemia, unspecified: Secondary | ICD-10-CM | POA: Diagnosis not present

## 2022-12-21 DIAGNOSIS — C7951 Secondary malignant neoplasm of bone: Secondary | ICD-10-CM | POA: Diagnosis not present

## 2022-12-21 DIAGNOSIS — C7931 Secondary malignant neoplasm of brain: Secondary | ICD-10-CM | POA: Diagnosis not present

## 2022-12-21 DIAGNOSIS — F419 Anxiety disorder, unspecified: Secondary | ICD-10-CM | POA: Diagnosis not present

## 2022-12-21 DIAGNOSIS — E039 Hypothyroidism, unspecified: Secondary | ICD-10-CM | POA: Diagnosis not present

## 2022-12-21 DIAGNOSIS — C539 Malignant neoplasm of cervix uteri, unspecified: Secondary | ICD-10-CM | POA: Diagnosis not present

## 2022-12-21 DIAGNOSIS — E441 Mild protein-calorie malnutrition: Secondary | ICD-10-CM | POA: Diagnosis not present

## 2022-12-21 DIAGNOSIS — C7800 Secondary malignant neoplasm of unspecified lung: Secondary | ICD-10-CM | POA: Diagnosis not present

## 2022-12-21 DIAGNOSIS — C7982 Secondary malignant neoplasm of genital organs: Secondary | ICD-10-CM | POA: Diagnosis not present

## 2022-12-22 DIAGNOSIS — E039 Hypothyroidism, unspecified: Secondary | ICD-10-CM | POA: Diagnosis not present

## 2022-12-22 DIAGNOSIS — Z87891 Personal history of nicotine dependence: Secondary | ICD-10-CM | POA: Diagnosis not present

## 2022-12-22 DIAGNOSIS — M5126 Other intervertebral disc displacement, lumbar region: Secondary | ICD-10-CM | POA: Diagnosis not present

## 2022-12-22 DIAGNOSIS — C539 Malignant neoplasm of cervix uteri, unspecified: Secondary | ICD-10-CM | POA: Diagnosis not present

## 2022-12-22 DIAGNOSIS — C7800 Secondary malignant neoplasm of unspecified lung: Secondary | ICD-10-CM | POA: Diagnosis not present

## 2022-12-22 DIAGNOSIS — C7951 Secondary malignant neoplasm of bone: Secondary | ICD-10-CM | POA: Diagnosis not present

## 2022-12-22 DIAGNOSIS — C7931 Secondary malignant neoplasm of brain: Secondary | ICD-10-CM | POA: Diagnosis not present

## 2022-12-22 DIAGNOSIS — C7982 Secondary malignant neoplasm of genital organs: Secondary | ICD-10-CM | POA: Diagnosis not present

## 2022-12-22 DIAGNOSIS — E441 Mild protein-calorie malnutrition: Secondary | ICD-10-CM | POA: Diagnosis not present

## 2022-12-22 DIAGNOSIS — D649 Anemia, unspecified: Secondary | ICD-10-CM | POA: Diagnosis not present

## 2022-12-22 DIAGNOSIS — F419 Anxiety disorder, unspecified: Secondary | ICD-10-CM | POA: Diagnosis not present

## 2022-12-23 DIAGNOSIS — E441 Mild protein-calorie malnutrition: Secondary | ICD-10-CM | POA: Diagnosis not present

## 2022-12-23 DIAGNOSIS — C539 Malignant neoplasm of cervix uteri, unspecified: Secondary | ICD-10-CM | POA: Diagnosis not present

## 2022-12-23 DIAGNOSIS — C7931 Secondary malignant neoplasm of brain: Secondary | ICD-10-CM | POA: Diagnosis not present

## 2022-12-23 DIAGNOSIS — E039 Hypothyroidism, unspecified: Secondary | ICD-10-CM | POA: Diagnosis not present

## 2022-12-23 DIAGNOSIS — C7800 Secondary malignant neoplasm of unspecified lung: Secondary | ICD-10-CM | POA: Diagnosis not present

## 2022-12-23 DIAGNOSIS — D649 Anemia, unspecified: Secondary | ICD-10-CM | POA: Diagnosis not present

## 2022-12-23 DIAGNOSIS — C7951 Secondary malignant neoplasm of bone: Secondary | ICD-10-CM | POA: Diagnosis not present

## 2022-12-23 DIAGNOSIS — M5126 Other intervertebral disc displacement, lumbar region: Secondary | ICD-10-CM | POA: Diagnosis not present

## 2022-12-23 DIAGNOSIS — Z87891 Personal history of nicotine dependence: Secondary | ICD-10-CM | POA: Diagnosis not present

## 2022-12-23 DIAGNOSIS — F419 Anxiety disorder, unspecified: Secondary | ICD-10-CM | POA: Diagnosis not present

## 2022-12-23 DIAGNOSIS — C7982 Secondary malignant neoplasm of genital organs: Secondary | ICD-10-CM | POA: Diagnosis not present

## 2022-12-24 DIAGNOSIS — C7931 Secondary malignant neoplasm of brain: Secondary | ICD-10-CM | POA: Diagnosis not present

## 2022-12-24 DIAGNOSIS — C7982 Secondary malignant neoplasm of genital organs: Secondary | ICD-10-CM | POA: Diagnosis not present

## 2022-12-24 DIAGNOSIS — C539 Malignant neoplasm of cervix uteri, unspecified: Secondary | ICD-10-CM | POA: Diagnosis not present

## 2022-12-24 DIAGNOSIS — E039 Hypothyroidism, unspecified: Secondary | ICD-10-CM | POA: Diagnosis not present

## 2022-12-24 DIAGNOSIS — C7951 Secondary malignant neoplasm of bone: Secondary | ICD-10-CM | POA: Diagnosis not present

## 2022-12-24 DIAGNOSIS — M5126 Other intervertebral disc displacement, lumbar region: Secondary | ICD-10-CM | POA: Diagnosis not present

## 2022-12-24 DIAGNOSIS — D649 Anemia, unspecified: Secondary | ICD-10-CM | POA: Diagnosis not present

## 2022-12-24 DIAGNOSIS — F419 Anxiety disorder, unspecified: Secondary | ICD-10-CM | POA: Diagnosis not present

## 2022-12-24 DIAGNOSIS — Z87891 Personal history of nicotine dependence: Secondary | ICD-10-CM | POA: Diagnosis not present

## 2022-12-24 DIAGNOSIS — C7800 Secondary malignant neoplasm of unspecified lung: Secondary | ICD-10-CM | POA: Diagnosis not present

## 2022-12-24 DIAGNOSIS — E441 Mild protein-calorie malnutrition: Secondary | ICD-10-CM | POA: Diagnosis not present

## 2022-12-25 DIAGNOSIS — M5126 Other intervertebral disc displacement, lumbar region: Secondary | ICD-10-CM | POA: Diagnosis not present

## 2022-12-25 DIAGNOSIS — C7800 Secondary malignant neoplasm of unspecified lung: Secondary | ICD-10-CM | POA: Diagnosis not present

## 2022-12-25 DIAGNOSIS — C7951 Secondary malignant neoplasm of bone: Secondary | ICD-10-CM | POA: Diagnosis not present

## 2022-12-25 DIAGNOSIS — E441 Mild protein-calorie malnutrition: Secondary | ICD-10-CM | POA: Diagnosis not present

## 2022-12-25 DIAGNOSIS — Z87891 Personal history of nicotine dependence: Secondary | ICD-10-CM | POA: Diagnosis not present

## 2022-12-25 DIAGNOSIS — C539 Malignant neoplasm of cervix uteri, unspecified: Secondary | ICD-10-CM | POA: Diagnosis not present

## 2022-12-25 DIAGNOSIS — C7931 Secondary malignant neoplasm of brain: Secondary | ICD-10-CM | POA: Diagnosis not present

## 2022-12-25 DIAGNOSIS — C7982 Secondary malignant neoplasm of genital organs: Secondary | ICD-10-CM | POA: Diagnosis not present

## 2022-12-25 DIAGNOSIS — D649 Anemia, unspecified: Secondary | ICD-10-CM | POA: Diagnosis not present

## 2022-12-25 DIAGNOSIS — E039 Hypothyroidism, unspecified: Secondary | ICD-10-CM | POA: Diagnosis not present

## 2022-12-25 DIAGNOSIS — F419 Anxiety disorder, unspecified: Secondary | ICD-10-CM | POA: Diagnosis not present

## 2022-12-26 DIAGNOSIS — C7951 Secondary malignant neoplasm of bone: Secondary | ICD-10-CM | POA: Diagnosis not present

## 2022-12-26 DIAGNOSIS — C7800 Secondary malignant neoplasm of unspecified lung: Secondary | ICD-10-CM | POA: Diagnosis not present

## 2022-12-26 DIAGNOSIS — E039 Hypothyroidism, unspecified: Secondary | ICD-10-CM | POA: Diagnosis not present

## 2022-12-26 DIAGNOSIS — Z87891 Personal history of nicotine dependence: Secondary | ICD-10-CM | POA: Diagnosis not present

## 2022-12-26 DIAGNOSIS — E441 Mild protein-calorie malnutrition: Secondary | ICD-10-CM | POA: Diagnosis not present

## 2022-12-26 DIAGNOSIS — C7982 Secondary malignant neoplasm of genital organs: Secondary | ICD-10-CM | POA: Diagnosis not present

## 2022-12-26 DIAGNOSIS — M5126 Other intervertebral disc displacement, lumbar region: Secondary | ICD-10-CM | POA: Diagnosis not present

## 2022-12-26 DIAGNOSIS — C7931 Secondary malignant neoplasm of brain: Secondary | ICD-10-CM | POA: Diagnosis not present

## 2022-12-26 DIAGNOSIS — F419 Anxiety disorder, unspecified: Secondary | ICD-10-CM | POA: Diagnosis not present

## 2022-12-26 DIAGNOSIS — C539 Malignant neoplasm of cervix uteri, unspecified: Secondary | ICD-10-CM | POA: Diagnosis not present

## 2022-12-26 DIAGNOSIS — D649 Anemia, unspecified: Secondary | ICD-10-CM | POA: Diagnosis not present

## 2022-12-27 DIAGNOSIS — Z87891 Personal history of nicotine dependence: Secondary | ICD-10-CM | POA: Diagnosis not present

## 2022-12-27 DIAGNOSIS — F419 Anxiety disorder, unspecified: Secondary | ICD-10-CM | POA: Diagnosis not present

## 2022-12-27 DIAGNOSIS — E441 Mild protein-calorie malnutrition: Secondary | ICD-10-CM | POA: Diagnosis not present

## 2022-12-27 DIAGNOSIS — E039 Hypothyroidism, unspecified: Secondary | ICD-10-CM | POA: Diagnosis not present

## 2022-12-27 DIAGNOSIS — C539 Malignant neoplasm of cervix uteri, unspecified: Secondary | ICD-10-CM | POA: Diagnosis not present

## 2022-12-27 DIAGNOSIS — C7951 Secondary malignant neoplasm of bone: Secondary | ICD-10-CM | POA: Diagnosis not present

## 2022-12-27 DIAGNOSIS — C7982 Secondary malignant neoplasm of genital organs: Secondary | ICD-10-CM | POA: Diagnosis not present

## 2022-12-27 DIAGNOSIS — M5126 Other intervertebral disc displacement, lumbar region: Secondary | ICD-10-CM | POA: Diagnosis not present

## 2022-12-27 DIAGNOSIS — D649 Anemia, unspecified: Secondary | ICD-10-CM | POA: Diagnosis not present

## 2022-12-27 DIAGNOSIS — C7931 Secondary malignant neoplasm of brain: Secondary | ICD-10-CM | POA: Diagnosis not present

## 2022-12-27 DIAGNOSIS — C7800 Secondary malignant neoplasm of unspecified lung: Secondary | ICD-10-CM | POA: Diagnosis not present

## 2022-12-28 DIAGNOSIS — Z87891 Personal history of nicotine dependence: Secondary | ICD-10-CM | POA: Diagnosis not present

## 2022-12-28 DIAGNOSIS — C539 Malignant neoplasm of cervix uteri, unspecified: Secondary | ICD-10-CM | POA: Diagnosis not present

## 2022-12-28 DIAGNOSIS — R531 Weakness: Secondary | ICD-10-CM | POA: Diagnosis not present

## 2022-12-28 DIAGNOSIS — F419 Anxiety disorder, unspecified: Secondary | ICD-10-CM | POA: Diagnosis not present

## 2022-12-28 DIAGNOSIS — E441 Mild protein-calorie malnutrition: Secondary | ICD-10-CM | POA: Diagnosis not present

## 2022-12-28 DIAGNOSIS — Z7401 Bed confinement status: Secondary | ICD-10-CM | POA: Diagnosis not present

## 2022-12-28 DIAGNOSIS — C7931 Secondary malignant neoplasm of brain: Secondary | ICD-10-CM | POA: Diagnosis not present

## 2022-12-28 DIAGNOSIS — C7951 Secondary malignant neoplasm of bone: Secondary | ICD-10-CM | POA: Diagnosis not present

## 2022-12-28 DIAGNOSIS — M5126 Other intervertebral disc displacement, lumbar region: Secondary | ICD-10-CM | POA: Diagnosis not present

## 2022-12-28 DIAGNOSIS — D649 Anemia, unspecified: Secondary | ICD-10-CM | POA: Diagnosis not present

## 2022-12-28 DIAGNOSIS — C7982 Secondary malignant neoplasm of genital organs: Secondary | ICD-10-CM | POA: Diagnosis not present

## 2022-12-28 DIAGNOSIS — C7800 Secondary malignant neoplasm of unspecified lung: Secondary | ICD-10-CM | POA: Diagnosis not present

## 2022-12-28 DIAGNOSIS — E039 Hypothyroidism, unspecified: Secondary | ICD-10-CM | POA: Diagnosis not present

## 2022-12-29 ENCOUNTER — Other Ambulatory Visit (HOSPITAL_COMMUNITY): Payer: Self-pay

## 2022-12-29 DIAGNOSIS — D649 Anemia, unspecified: Secondary | ICD-10-CM | POA: Diagnosis not present

## 2022-12-29 DIAGNOSIS — Z87891 Personal history of nicotine dependence: Secondary | ICD-10-CM | POA: Diagnosis not present

## 2022-12-29 DIAGNOSIS — C7982 Secondary malignant neoplasm of genital organs: Secondary | ICD-10-CM | POA: Diagnosis not present

## 2022-12-29 DIAGNOSIS — C539 Malignant neoplasm of cervix uteri, unspecified: Secondary | ICD-10-CM | POA: Diagnosis not present

## 2022-12-29 DIAGNOSIS — E039 Hypothyroidism, unspecified: Secondary | ICD-10-CM | POA: Diagnosis not present

## 2022-12-29 DIAGNOSIS — F419 Anxiety disorder, unspecified: Secondary | ICD-10-CM | POA: Diagnosis not present

## 2022-12-29 DIAGNOSIS — C7800 Secondary malignant neoplasm of unspecified lung: Secondary | ICD-10-CM | POA: Diagnosis not present

## 2022-12-29 DIAGNOSIS — E441 Mild protein-calorie malnutrition: Secondary | ICD-10-CM | POA: Diagnosis not present

## 2022-12-29 DIAGNOSIS — C7951 Secondary malignant neoplasm of bone: Secondary | ICD-10-CM | POA: Diagnosis not present

## 2022-12-29 DIAGNOSIS — C7931 Secondary malignant neoplasm of brain: Secondary | ICD-10-CM | POA: Diagnosis not present

## 2022-12-29 DIAGNOSIS — M5126 Other intervertebral disc displacement, lumbar region: Secondary | ICD-10-CM | POA: Diagnosis not present

## 2022-12-30 DIAGNOSIS — M5126 Other intervertebral disc displacement, lumbar region: Secondary | ICD-10-CM | POA: Diagnosis not present

## 2022-12-30 DIAGNOSIS — C7800 Secondary malignant neoplasm of unspecified lung: Secondary | ICD-10-CM | POA: Diagnosis not present

## 2022-12-30 DIAGNOSIS — D649 Anemia, unspecified: Secondary | ICD-10-CM | POA: Diagnosis not present

## 2022-12-30 DIAGNOSIS — C7982 Secondary malignant neoplasm of genital organs: Secondary | ICD-10-CM | POA: Diagnosis not present

## 2022-12-30 DIAGNOSIS — E441 Mild protein-calorie malnutrition: Secondary | ICD-10-CM | POA: Diagnosis not present

## 2022-12-30 DIAGNOSIS — C539 Malignant neoplasm of cervix uteri, unspecified: Secondary | ICD-10-CM | POA: Diagnosis not present

## 2022-12-30 DIAGNOSIS — C7951 Secondary malignant neoplasm of bone: Secondary | ICD-10-CM | POA: Diagnosis not present

## 2022-12-30 DIAGNOSIS — Z87891 Personal history of nicotine dependence: Secondary | ICD-10-CM | POA: Diagnosis not present

## 2022-12-30 DIAGNOSIS — C7931 Secondary malignant neoplasm of brain: Secondary | ICD-10-CM | POA: Diagnosis not present

## 2022-12-30 DIAGNOSIS — E039 Hypothyroidism, unspecified: Secondary | ICD-10-CM | POA: Diagnosis not present

## 2022-12-30 DIAGNOSIS — F419 Anxiety disorder, unspecified: Secondary | ICD-10-CM | POA: Diagnosis not present

## 2022-12-31 DIAGNOSIS — F419 Anxiety disorder, unspecified: Secondary | ICD-10-CM | POA: Diagnosis not present

## 2022-12-31 DIAGNOSIS — C539 Malignant neoplasm of cervix uteri, unspecified: Secondary | ICD-10-CM | POA: Diagnosis not present

## 2022-12-31 DIAGNOSIS — C7951 Secondary malignant neoplasm of bone: Secondary | ICD-10-CM | POA: Diagnosis not present

## 2022-12-31 DIAGNOSIS — M5126 Other intervertebral disc displacement, lumbar region: Secondary | ICD-10-CM | POA: Diagnosis not present

## 2022-12-31 DIAGNOSIS — Z87891 Personal history of nicotine dependence: Secondary | ICD-10-CM | POA: Diagnosis not present

## 2022-12-31 DIAGNOSIS — C7931 Secondary malignant neoplasm of brain: Secondary | ICD-10-CM | POA: Diagnosis not present

## 2022-12-31 DIAGNOSIS — C7800 Secondary malignant neoplasm of unspecified lung: Secondary | ICD-10-CM | POA: Diagnosis not present

## 2022-12-31 DIAGNOSIS — E441 Mild protein-calorie malnutrition: Secondary | ICD-10-CM | POA: Diagnosis not present

## 2022-12-31 DIAGNOSIS — C7982 Secondary malignant neoplasm of genital organs: Secondary | ICD-10-CM | POA: Diagnosis not present

## 2022-12-31 DIAGNOSIS — D649 Anemia, unspecified: Secondary | ICD-10-CM | POA: Diagnosis not present

## 2022-12-31 DIAGNOSIS — E039 Hypothyroidism, unspecified: Secondary | ICD-10-CM | POA: Diagnosis not present

## 2023-01-01 DIAGNOSIS — C7951 Secondary malignant neoplasm of bone: Secondary | ICD-10-CM | POA: Diagnosis not present

## 2023-01-01 DIAGNOSIS — E039 Hypothyroidism, unspecified: Secondary | ICD-10-CM | POA: Diagnosis not present

## 2023-01-01 DIAGNOSIS — C7931 Secondary malignant neoplasm of brain: Secondary | ICD-10-CM | POA: Diagnosis not present

## 2023-01-01 DIAGNOSIS — C539 Malignant neoplasm of cervix uteri, unspecified: Secondary | ICD-10-CM | POA: Diagnosis not present

## 2023-01-01 DIAGNOSIS — E441 Mild protein-calorie malnutrition: Secondary | ICD-10-CM | POA: Diagnosis not present

## 2023-01-01 DIAGNOSIS — C7800 Secondary malignant neoplasm of unspecified lung: Secondary | ICD-10-CM | POA: Diagnosis not present

## 2023-01-01 DIAGNOSIS — D649 Anemia, unspecified: Secondary | ICD-10-CM | POA: Diagnosis not present

## 2023-01-01 DIAGNOSIS — Z87891 Personal history of nicotine dependence: Secondary | ICD-10-CM | POA: Diagnosis not present

## 2023-01-01 DIAGNOSIS — C7982 Secondary malignant neoplasm of genital organs: Secondary | ICD-10-CM | POA: Diagnosis not present

## 2023-01-01 DIAGNOSIS — M5126 Other intervertebral disc displacement, lumbar region: Secondary | ICD-10-CM | POA: Diagnosis not present

## 2023-01-01 DIAGNOSIS — F419 Anxiety disorder, unspecified: Secondary | ICD-10-CM | POA: Diagnosis not present

## 2023-01-02 DIAGNOSIS — C7800 Secondary malignant neoplasm of unspecified lung: Secondary | ICD-10-CM | POA: Diagnosis not present

## 2023-01-02 DIAGNOSIS — C7982 Secondary malignant neoplasm of genital organs: Secondary | ICD-10-CM | POA: Diagnosis not present

## 2023-01-02 DIAGNOSIS — M5126 Other intervertebral disc displacement, lumbar region: Secondary | ICD-10-CM | POA: Diagnosis not present

## 2023-01-02 DIAGNOSIS — F419 Anxiety disorder, unspecified: Secondary | ICD-10-CM | POA: Diagnosis not present

## 2023-01-02 DIAGNOSIS — C539 Malignant neoplasm of cervix uteri, unspecified: Secondary | ICD-10-CM | POA: Diagnosis not present

## 2023-01-02 DIAGNOSIS — C7951 Secondary malignant neoplasm of bone: Secondary | ICD-10-CM | POA: Diagnosis not present

## 2023-01-02 DIAGNOSIS — D649 Anemia, unspecified: Secondary | ICD-10-CM | POA: Diagnosis not present

## 2023-01-02 DIAGNOSIS — E039 Hypothyroidism, unspecified: Secondary | ICD-10-CM | POA: Diagnosis not present

## 2023-01-02 DIAGNOSIS — C7931 Secondary malignant neoplasm of brain: Secondary | ICD-10-CM | POA: Diagnosis not present

## 2023-01-02 DIAGNOSIS — E441 Mild protein-calorie malnutrition: Secondary | ICD-10-CM | POA: Diagnosis not present

## 2023-01-02 DIAGNOSIS — Z87891 Personal history of nicotine dependence: Secondary | ICD-10-CM | POA: Diagnosis not present

## 2023-01-03 DIAGNOSIS — C539 Malignant neoplasm of cervix uteri, unspecified: Secondary | ICD-10-CM | POA: Diagnosis not present

## 2023-01-03 DIAGNOSIS — C7982 Secondary malignant neoplasm of genital organs: Secondary | ICD-10-CM | POA: Diagnosis not present

## 2023-01-03 DIAGNOSIS — Z87891 Personal history of nicotine dependence: Secondary | ICD-10-CM | POA: Diagnosis not present

## 2023-01-03 DIAGNOSIS — M5126 Other intervertebral disc displacement, lumbar region: Secondary | ICD-10-CM | POA: Diagnosis not present

## 2023-01-03 DIAGNOSIS — E441 Mild protein-calorie malnutrition: Secondary | ICD-10-CM | POA: Diagnosis not present

## 2023-01-03 DIAGNOSIS — C7800 Secondary malignant neoplasm of unspecified lung: Secondary | ICD-10-CM | POA: Diagnosis not present

## 2023-01-03 DIAGNOSIS — C7931 Secondary malignant neoplasm of brain: Secondary | ICD-10-CM | POA: Diagnosis not present

## 2023-01-03 DIAGNOSIS — C7951 Secondary malignant neoplasm of bone: Secondary | ICD-10-CM | POA: Diagnosis not present

## 2023-01-03 DIAGNOSIS — D649 Anemia, unspecified: Secondary | ICD-10-CM | POA: Diagnosis not present

## 2023-01-03 DIAGNOSIS — F419 Anxiety disorder, unspecified: Secondary | ICD-10-CM | POA: Diagnosis not present

## 2023-01-03 DIAGNOSIS — E039 Hypothyroidism, unspecified: Secondary | ICD-10-CM | POA: Diagnosis not present

## 2023-01-04 DIAGNOSIS — E441 Mild protein-calorie malnutrition: Secondary | ICD-10-CM | POA: Diagnosis not present

## 2023-01-04 DIAGNOSIS — D649 Anemia, unspecified: Secondary | ICD-10-CM | POA: Diagnosis not present

## 2023-01-04 DIAGNOSIS — E039 Hypothyroidism, unspecified: Secondary | ICD-10-CM | POA: Diagnosis not present

## 2023-01-04 DIAGNOSIS — C7800 Secondary malignant neoplasm of unspecified lung: Secondary | ICD-10-CM | POA: Diagnosis not present

## 2023-01-04 DIAGNOSIS — F419 Anxiety disorder, unspecified: Secondary | ICD-10-CM | POA: Diagnosis not present

## 2023-01-04 DIAGNOSIS — C7931 Secondary malignant neoplasm of brain: Secondary | ICD-10-CM | POA: Diagnosis not present

## 2023-01-04 DIAGNOSIS — M5126 Other intervertebral disc displacement, lumbar region: Secondary | ICD-10-CM | POA: Diagnosis not present

## 2023-01-04 DIAGNOSIS — C7951 Secondary malignant neoplasm of bone: Secondary | ICD-10-CM | POA: Diagnosis not present

## 2023-01-04 DIAGNOSIS — C7982 Secondary malignant neoplasm of genital organs: Secondary | ICD-10-CM | POA: Diagnosis not present

## 2023-01-04 DIAGNOSIS — Z87891 Personal history of nicotine dependence: Secondary | ICD-10-CM | POA: Diagnosis not present

## 2023-01-04 DIAGNOSIS — C539 Malignant neoplasm of cervix uteri, unspecified: Secondary | ICD-10-CM | POA: Diagnosis not present

## 2023-01-05 DIAGNOSIS — M5126 Other intervertebral disc displacement, lumbar region: Secondary | ICD-10-CM | POA: Diagnosis not present

## 2023-01-05 DIAGNOSIS — C7931 Secondary malignant neoplasm of brain: Secondary | ICD-10-CM | POA: Diagnosis not present

## 2023-01-05 DIAGNOSIS — D649 Anemia, unspecified: Secondary | ICD-10-CM | POA: Diagnosis not present

## 2023-01-05 DIAGNOSIS — C7800 Secondary malignant neoplasm of unspecified lung: Secondary | ICD-10-CM | POA: Diagnosis not present

## 2023-01-05 DIAGNOSIS — C7951 Secondary malignant neoplasm of bone: Secondary | ICD-10-CM | POA: Diagnosis not present

## 2023-01-05 DIAGNOSIS — Z87891 Personal history of nicotine dependence: Secondary | ICD-10-CM | POA: Diagnosis not present

## 2023-01-05 DIAGNOSIS — E441 Mild protein-calorie malnutrition: Secondary | ICD-10-CM | POA: Diagnosis not present

## 2023-01-05 DIAGNOSIS — C539 Malignant neoplasm of cervix uteri, unspecified: Secondary | ICD-10-CM | POA: Diagnosis not present

## 2023-01-05 DIAGNOSIS — E039 Hypothyroidism, unspecified: Secondary | ICD-10-CM | POA: Diagnosis not present

## 2023-01-05 DIAGNOSIS — F419 Anxiety disorder, unspecified: Secondary | ICD-10-CM | POA: Diagnosis not present

## 2023-01-05 DIAGNOSIS — C7982 Secondary malignant neoplasm of genital organs: Secondary | ICD-10-CM | POA: Diagnosis not present

## 2023-01-06 DIAGNOSIS — D649 Anemia, unspecified: Secondary | ICD-10-CM | POA: Diagnosis not present

## 2023-01-06 DIAGNOSIS — C7982 Secondary malignant neoplasm of genital organs: Secondary | ICD-10-CM | POA: Diagnosis not present

## 2023-01-06 DIAGNOSIS — F419 Anxiety disorder, unspecified: Secondary | ICD-10-CM | POA: Diagnosis not present

## 2023-01-06 DIAGNOSIS — E039 Hypothyroidism, unspecified: Secondary | ICD-10-CM | POA: Diagnosis not present

## 2023-01-06 DIAGNOSIS — C539 Malignant neoplasm of cervix uteri, unspecified: Secondary | ICD-10-CM | POA: Diagnosis not present

## 2023-01-06 DIAGNOSIS — C7951 Secondary malignant neoplasm of bone: Secondary | ICD-10-CM | POA: Diagnosis not present

## 2023-01-06 DIAGNOSIS — C7931 Secondary malignant neoplasm of brain: Secondary | ICD-10-CM | POA: Diagnosis not present

## 2023-01-06 DIAGNOSIS — E441 Mild protein-calorie malnutrition: Secondary | ICD-10-CM | POA: Diagnosis not present

## 2023-01-06 DIAGNOSIS — M5126 Other intervertebral disc displacement, lumbar region: Secondary | ICD-10-CM | POA: Diagnosis not present

## 2023-01-06 DIAGNOSIS — C7800 Secondary malignant neoplasm of unspecified lung: Secondary | ICD-10-CM | POA: Diagnosis not present

## 2023-01-06 DIAGNOSIS — Z87891 Personal history of nicotine dependence: Secondary | ICD-10-CM | POA: Diagnosis not present

## 2023-01-07 DIAGNOSIS — D649 Anemia, unspecified: Secondary | ICD-10-CM | POA: Diagnosis not present

## 2023-01-07 DIAGNOSIS — E039 Hypothyroidism, unspecified: Secondary | ICD-10-CM | POA: Diagnosis not present

## 2023-01-07 DIAGNOSIS — C7982 Secondary malignant neoplasm of genital organs: Secondary | ICD-10-CM | POA: Diagnosis not present

## 2023-01-07 DIAGNOSIS — E441 Mild protein-calorie malnutrition: Secondary | ICD-10-CM | POA: Diagnosis not present

## 2023-01-07 DIAGNOSIS — C7951 Secondary malignant neoplasm of bone: Secondary | ICD-10-CM | POA: Diagnosis not present

## 2023-01-07 DIAGNOSIS — M5126 Other intervertebral disc displacement, lumbar region: Secondary | ICD-10-CM | POA: Diagnosis not present

## 2023-01-07 DIAGNOSIS — C539 Malignant neoplasm of cervix uteri, unspecified: Secondary | ICD-10-CM | POA: Diagnosis not present

## 2023-01-07 DIAGNOSIS — C7800 Secondary malignant neoplasm of unspecified lung: Secondary | ICD-10-CM | POA: Diagnosis not present

## 2023-01-07 DIAGNOSIS — Z87891 Personal history of nicotine dependence: Secondary | ICD-10-CM | POA: Diagnosis not present

## 2023-01-07 DIAGNOSIS — F419 Anxiety disorder, unspecified: Secondary | ICD-10-CM | POA: Diagnosis not present

## 2023-01-07 DIAGNOSIS — C7931 Secondary malignant neoplasm of brain: Secondary | ICD-10-CM | POA: Diagnosis not present

## 2023-01-08 DIAGNOSIS — F419 Anxiety disorder, unspecified: Secondary | ICD-10-CM | POA: Diagnosis not present

## 2023-01-08 DIAGNOSIS — C7982 Secondary malignant neoplasm of genital organs: Secondary | ICD-10-CM | POA: Diagnosis not present

## 2023-01-08 DIAGNOSIS — E039 Hypothyroidism, unspecified: Secondary | ICD-10-CM | POA: Diagnosis not present

## 2023-01-08 DIAGNOSIS — D649 Anemia, unspecified: Secondary | ICD-10-CM | POA: Diagnosis not present

## 2023-01-08 DIAGNOSIS — C7951 Secondary malignant neoplasm of bone: Secondary | ICD-10-CM | POA: Diagnosis not present

## 2023-01-08 DIAGNOSIS — Z87891 Personal history of nicotine dependence: Secondary | ICD-10-CM | POA: Diagnosis not present

## 2023-01-08 DIAGNOSIS — E441 Mild protein-calorie malnutrition: Secondary | ICD-10-CM | POA: Diagnosis not present

## 2023-01-08 DIAGNOSIS — M5126 Other intervertebral disc displacement, lumbar region: Secondary | ICD-10-CM | POA: Diagnosis not present

## 2023-01-08 DIAGNOSIS — C7931 Secondary malignant neoplasm of brain: Secondary | ICD-10-CM | POA: Diagnosis not present

## 2023-01-08 DIAGNOSIS — C539 Malignant neoplasm of cervix uteri, unspecified: Secondary | ICD-10-CM | POA: Diagnosis not present

## 2023-01-08 DIAGNOSIS — C7800 Secondary malignant neoplasm of unspecified lung: Secondary | ICD-10-CM | POA: Diagnosis not present

## 2023-01-09 DIAGNOSIS — C7982 Secondary malignant neoplasm of genital organs: Secondary | ICD-10-CM | POA: Diagnosis not present

## 2023-01-09 DIAGNOSIS — F419 Anxiety disorder, unspecified: Secondary | ICD-10-CM | POA: Diagnosis not present

## 2023-01-09 DIAGNOSIS — C7951 Secondary malignant neoplasm of bone: Secondary | ICD-10-CM | POA: Diagnosis not present

## 2023-01-09 DIAGNOSIS — E441 Mild protein-calorie malnutrition: Secondary | ICD-10-CM | POA: Diagnosis not present

## 2023-01-09 DIAGNOSIS — C539 Malignant neoplasm of cervix uteri, unspecified: Secondary | ICD-10-CM | POA: Diagnosis not present

## 2023-01-09 DIAGNOSIS — D649 Anemia, unspecified: Secondary | ICD-10-CM | POA: Diagnosis not present

## 2023-01-09 DIAGNOSIS — E039 Hypothyroidism, unspecified: Secondary | ICD-10-CM | POA: Diagnosis not present

## 2023-01-09 DIAGNOSIS — M5126 Other intervertebral disc displacement, lumbar region: Secondary | ICD-10-CM | POA: Diagnosis not present

## 2023-01-09 DIAGNOSIS — C7931 Secondary malignant neoplasm of brain: Secondary | ICD-10-CM | POA: Diagnosis not present

## 2023-01-09 DIAGNOSIS — C7800 Secondary malignant neoplasm of unspecified lung: Secondary | ICD-10-CM | POA: Diagnosis not present

## 2023-01-09 DIAGNOSIS — Z87891 Personal history of nicotine dependence: Secondary | ICD-10-CM | POA: Diagnosis not present

## 2023-01-10 ENCOUNTER — Other Ambulatory Visit (HOSPITAL_COMMUNITY): Payer: Self-pay

## 2023-01-10 DIAGNOSIS — E441 Mild protein-calorie malnutrition: Secondary | ICD-10-CM | POA: Diagnosis not present

## 2023-01-10 DIAGNOSIS — C7931 Secondary malignant neoplasm of brain: Secondary | ICD-10-CM | POA: Diagnosis not present

## 2023-01-10 DIAGNOSIS — Z87891 Personal history of nicotine dependence: Secondary | ICD-10-CM | POA: Diagnosis not present

## 2023-01-10 DIAGNOSIS — C539 Malignant neoplasm of cervix uteri, unspecified: Secondary | ICD-10-CM | POA: Diagnosis not present

## 2023-01-10 DIAGNOSIS — C7951 Secondary malignant neoplasm of bone: Secondary | ICD-10-CM | POA: Diagnosis not present

## 2023-01-10 DIAGNOSIS — D649 Anemia, unspecified: Secondary | ICD-10-CM | POA: Diagnosis not present

## 2023-01-10 DIAGNOSIS — M5126 Other intervertebral disc displacement, lumbar region: Secondary | ICD-10-CM | POA: Diagnosis not present

## 2023-01-10 DIAGNOSIS — F419 Anxiety disorder, unspecified: Secondary | ICD-10-CM | POA: Diagnosis not present

## 2023-01-10 DIAGNOSIS — C7982 Secondary malignant neoplasm of genital organs: Secondary | ICD-10-CM | POA: Diagnosis not present

## 2023-01-10 DIAGNOSIS — C7800 Secondary malignant neoplasm of unspecified lung: Secondary | ICD-10-CM | POA: Diagnosis not present

## 2023-01-10 DIAGNOSIS — E039 Hypothyroidism, unspecified: Secondary | ICD-10-CM | POA: Diagnosis not present

## 2023-01-11 DIAGNOSIS — M5126 Other intervertebral disc displacement, lumbar region: Secondary | ICD-10-CM | POA: Diagnosis not present

## 2023-01-11 DIAGNOSIS — C7931 Secondary malignant neoplasm of brain: Secondary | ICD-10-CM | POA: Diagnosis not present

## 2023-01-11 DIAGNOSIS — C7951 Secondary malignant neoplasm of bone: Secondary | ICD-10-CM | POA: Diagnosis not present

## 2023-01-11 DIAGNOSIS — E441 Mild protein-calorie malnutrition: Secondary | ICD-10-CM | POA: Diagnosis not present

## 2023-01-11 DIAGNOSIS — C539 Malignant neoplasm of cervix uteri, unspecified: Secondary | ICD-10-CM | POA: Diagnosis not present

## 2023-01-11 DIAGNOSIS — C7982 Secondary malignant neoplasm of genital organs: Secondary | ICD-10-CM | POA: Diagnosis not present

## 2023-01-11 DIAGNOSIS — F419 Anxiety disorder, unspecified: Secondary | ICD-10-CM | POA: Diagnosis not present

## 2023-01-11 DIAGNOSIS — C7800 Secondary malignant neoplasm of unspecified lung: Secondary | ICD-10-CM | POA: Diagnosis not present

## 2023-01-11 DIAGNOSIS — D649 Anemia, unspecified: Secondary | ICD-10-CM | POA: Diagnosis not present

## 2023-01-11 DIAGNOSIS — E039 Hypothyroidism, unspecified: Secondary | ICD-10-CM | POA: Diagnosis not present

## 2023-01-11 DIAGNOSIS — Z87891 Personal history of nicotine dependence: Secondary | ICD-10-CM | POA: Diagnosis not present

## 2023-01-12 DIAGNOSIS — E039 Hypothyroidism, unspecified: Secondary | ICD-10-CM | POA: Diagnosis not present

## 2023-01-12 DIAGNOSIS — D649 Anemia, unspecified: Secondary | ICD-10-CM | POA: Diagnosis not present

## 2023-01-12 DIAGNOSIS — C7982 Secondary malignant neoplasm of genital organs: Secondary | ICD-10-CM | POA: Diagnosis not present

## 2023-01-12 DIAGNOSIS — C7800 Secondary malignant neoplasm of unspecified lung: Secondary | ICD-10-CM | POA: Diagnosis not present

## 2023-01-12 DIAGNOSIS — Z87891 Personal history of nicotine dependence: Secondary | ICD-10-CM | POA: Diagnosis not present

## 2023-01-12 DIAGNOSIS — M5126 Other intervertebral disc displacement, lumbar region: Secondary | ICD-10-CM | POA: Diagnosis not present

## 2023-01-12 DIAGNOSIS — C7951 Secondary malignant neoplasm of bone: Secondary | ICD-10-CM | POA: Diagnosis not present

## 2023-01-12 DIAGNOSIS — F419 Anxiety disorder, unspecified: Secondary | ICD-10-CM | POA: Diagnosis not present

## 2023-01-12 DIAGNOSIS — C7931 Secondary malignant neoplasm of brain: Secondary | ICD-10-CM | POA: Diagnosis not present

## 2023-01-12 DIAGNOSIS — C539 Malignant neoplasm of cervix uteri, unspecified: Secondary | ICD-10-CM | POA: Diagnosis not present

## 2023-01-12 DIAGNOSIS — E441 Mild protein-calorie malnutrition: Secondary | ICD-10-CM | POA: Diagnosis not present

## 2023-01-13 DIAGNOSIS — C7982 Secondary malignant neoplasm of genital organs: Secondary | ICD-10-CM | POA: Diagnosis not present

## 2023-01-13 DIAGNOSIS — M5126 Other intervertebral disc displacement, lumbar region: Secondary | ICD-10-CM | POA: Diagnosis not present

## 2023-01-13 DIAGNOSIS — C7931 Secondary malignant neoplasm of brain: Secondary | ICD-10-CM | POA: Diagnosis not present

## 2023-01-13 DIAGNOSIS — C539 Malignant neoplasm of cervix uteri, unspecified: Secondary | ICD-10-CM | POA: Diagnosis not present

## 2023-01-13 DIAGNOSIS — Z87891 Personal history of nicotine dependence: Secondary | ICD-10-CM | POA: Diagnosis not present

## 2023-01-13 DIAGNOSIS — C7800 Secondary malignant neoplasm of unspecified lung: Secondary | ICD-10-CM | POA: Diagnosis not present

## 2023-01-13 DIAGNOSIS — E039 Hypothyroidism, unspecified: Secondary | ICD-10-CM | POA: Diagnosis not present

## 2023-01-13 DIAGNOSIS — D649 Anemia, unspecified: Secondary | ICD-10-CM | POA: Diagnosis not present

## 2023-01-13 DIAGNOSIS — E441 Mild protein-calorie malnutrition: Secondary | ICD-10-CM | POA: Diagnosis not present

## 2023-01-13 DIAGNOSIS — F419 Anxiety disorder, unspecified: Secondary | ICD-10-CM | POA: Diagnosis not present

## 2023-01-13 DIAGNOSIS — C7951 Secondary malignant neoplasm of bone: Secondary | ICD-10-CM | POA: Diagnosis not present

## 2023-01-14 DIAGNOSIS — E441 Mild protein-calorie malnutrition: Secondary | ICD-10-CM | POA: Diagnosis not present

## 2023-01-14 DIAGNOSIS — M5126 Other intervertebral disc displacement, lumbar region: Secondary | ICD-10-CM | POA: Diagnosis not present

## 2023-01-14 DIAGNOSIS — F419 Anxiety disorder, unspecified: Secondary | ICD-10-CM | POA: Diagnosis not present

## 2023-01-14 DIAGNOSIS — C7951 Secondary malignant neoplasm of bone: Secondary | ICD-10-CM | POA: Diagnosis not present

## 2023-01-14 DIAGNOSIS — C7982 Secondary malignant neoplasm of genital organs: Secondary | ICD-10-CM | POA: Diagnosis not present

## 2023-01-14 DIAGNOSIS — C539 Malignant neoplasm of cervix uteri, unspecified: Secondary | ICD-10-CM | POA: Diagnosis not present

## 2023-01-14 DIAGNOSIS — E039 Hypothyroidism, unspecified: Secondary | ICD-10-CM | POA: Diagnosis not present

## 2023-01-14 DIAGNOSIS — C7931 Secondary malignant neoplasm of brain: Secondary | ICD-10-CM | POA: Diagnosis not present

## 2023-01-14 DIAGNOSIS — D649 Anemia, unspecified: Secondary | ICD-10-CM | POA: Diagnosis not present

## 2023-01-14 DIAGNOSIS — Z87891 Personal history of nicotine dependence: Secondary | ICD-10-CM | POA: Diagnosis not present

## 2023-01-14 DIAGNOSIS — C7800 Secondary malignant neoplasm of unspecified lung: Secondary | ICD-10-CM | POA: Diagnosis not present

## 2023-01-15 DIAGNOSIS — D649 Anemia, unspecified: Secondary | ICD-10-CM | POA: Diagnosis not present

## 2023-01-15 DIAGNOSIS — Z87891 Personal history of nicotine dependence: Secondary | ICD-10-CM | POA: Diagnosis not present

## 2023-01-15 DIAGNOSIS — C7800 Secondary malignant neoplasm of unspecified lung: Secondary | ICD-10-CM | POA: Diagnosis not present

## 2023-01-15 DIAGNOSIS — C539 Malignant neoplasm of cervix uteri, unspecified: Secondary | ICD-10-CM | POA: Diagnosis not present

## 2023-01-15 DIAGNOSIS — F419 Anxiety disorder, unspecified: Secondary | ICD-10-CM | POA: Diagnosis not present

## 2023-01-15 DIAGNOSIS — E441 Mild protein-calorie malnutrition: Secondary | ICD-10-CM | POA: Diagnosis not present

## 2023-01-15 DIAGNOSIS — E039 Hypothyroidism, unspecified: Secondary | ICD-10-CM | POA: Diagnosis not present

## 2023-01-15 DIAGNOSIS — C7931 Secondary malignant neoplasm of brain: Secondary | ICD-10-CM | POA: Diagnosis not present

## 2023-01-15 DIAGNOSIS — C7982 Secondary malignant neoplasm of genital organs: Secondary | ICD-10-CM | POA: Diagnosis not present

## 2023-01-15 DIAGNOSIS — M5126 Other intervertebral disc displacement, lumbar region: Secondary | ICD-10-CM | POA: Diagnosis not present

## 2023-01-15 DIAGNOSIS — C7951 Secondary malignant neoplasm of bone: Secondary | ICD-10-CM | POA: Diagnosis not present

## 2023-01-16 DIAGNOSIS — C7931 Secondary malignant neoplasm of brain: Secondary | ICD-10-CM | POA: Diagnosis not present

## 2023-01-16 DIAGNOSIS — C539 Malignant neoplasm of cervix uteri, unspecified: Secondary | ICD-10-CM | POA: Diagnosis not present

## 2023-01-16 DIAGNOSIS — C7951 Secondary malignant neoplasm of bone: Secondary | ICD-10-CM | POA: Diagnosis not present

## 2023-01-16 DIAGNOSIS — F419 Anxiety disorder, unspecified: Secondary | ICD-10-CM | POA: Diagnosis not present

## 2023-01-16 DIAGNOSIS — Z87891 Personal history of nicotine dependence: Secondary | ICD-10-CM | POA: Diagnosis not present

## 2023-01-16 DIAGNOSIS — C7982 Secondary malignant neoplasm of genital organs: Secondary | ICD-10-CM | POA: Diagnosis not present

## 2023-01-16 DIAGNOSIS — E039 Hypothyroidism, unspecified: Secondary | ICD-10-CM | POA: Diagnosis not present

## 2023-01-16 DIAGNOSIS — M5126 Other intervertebral disc displacement, lumbar region: Secondary | ICD-10-CM | POA: Diagnosis not present

## 2023-01-16 DIAGNOSIS — C7800 Secondary malignant neoplasm of unspecified lung: Secondary | ICD-10-CM | POA: Diagnosis not present

## 2023-01-16 DIAGNOSIS — D649 Anemia, unspecified: Secondary | ICD-10-CM | POA: Diagnosis not present

## 2023-01-16 DIAGNOSIS — E441 Mild protein-calorie malnutrition: Secondary | ICD-10-CM | POA: Diagnosis not present

## 2023-01-17 DIAGNOSIS — C539 Malignant neoplasm of cervix uteri, unspecified: Secondary | ICD-10-CM | POA: Diagnosis not present

## 2023-01-17 DIAGNOSIS — Z87891 Personal history of nicotine dependence: Secondary | ICD-10-CM | POA: Diagnosis not present

## 2023-01-17 DIAGNOSIS — D649 Anemia, unspecified: Secondary | ICD-10-CM | POA: Diagnosis not present

## 2023-01-17 DIAGNOSIS — M5126 Other intervertebral disc displacement, lumbar region: Secondary | ICD-10-CM | POA: Diagnosis not present

## 2023-01-17 DIAGNOSIS — C7951 Secondary malignant neoplasm of bone: Secondary | ICD-10-CM | POA: Diagnosis not present

## 2023-01-17 DIAGNOSIS — E039 Hypothyroidism, unspecified: Secondary | ICD-10-CM | POA: Diagnosis not present

## 2023-01-17 DIAGNOSIS — C7982 Secondary malignant neoplasm of genital organs: Secondary | ICD-10-CM | POA: Diagnosis not present

## 2023-01-17 DIAGNOSIS — E441 Mild protein-calorie malnutrition: Secondary | ICD-10-CM | POA: Diagnosis not present

## 2023-01-17 DIAGNOSIS — F419 Anxiety disorder, unspecified: Secondary | ICD-10-CM | POA: Diagnosis not present

## 2023-01-17 DIAGNOSIS — C7931 Secondary malignant neoplasm of brain: Secondary | ICD-10-CM | POA: Diagnosis not present

## 2023-01-17 DIAGNOSIS — C7800 Secondary malignant neoplasm of unspecified lung: Secondary | ICD-10-CM | POA: Diagnosis not present

## 2023-01-18 DIAGNOSIS — C7982 Secondary malignant neoplasm of genital organs: Secondary | ICD-10-CM | POA: Diagnosis not present

## 2023-01-18 DIAGNOSIS — E441 Mild protein-calorie malnutrition: Secondary | ICD-10-CM | POA: Diagnosis not present

## 2023-01-18 DIAGNOSIS — E039 Hypothyroidism, unspecified: Secondary | ICD-10-CM | POA: Diagnosis not present

## 2023-01-18 DIAGNOSIS — C7800 Secondary malignant neoplasm of unspecified lung: Secondary | ICD-10-CM | POA: Diagnosis not present

## 2023-01-18 DIAGNOSIS — M5126 Other intervertebral disc displacement, lumbar region: Secondary | ICD-10-CM | POA: Diagnosis not present

## 2023-01-18 DIAGNOSIS — F419 Anxiety disorder, unspecified: Secondary | ICD-10-CM | POA: Diagnosis not present

## 2023-01-18 DIAGNOSIS — D649 Anemia, unspecified: Secondary | ICD-10-CM | POA: Diagnosis not present

## 2023-01-18 DIAGNOSIS — C7951 Secondary malignant neoplasm of bone: Secondary | ICD-10-CM | POA: Diagnosis not present

## 2023-01-18 DIAGNOSIS — Z87891 Personal history of nicotine dependence: Secondary | ICD-10-CM | POA: Diagnosis not present

## 2023-01-18 DIAGNOSIS — C539 Malignant neoplasm of cervix uteri, unspecified: Secondary | ICD-10-CM | POA: Diagnosis not present

## 2023-01-18 DIAGNOSIS — C7931 Secondary malignant neoplasm of brain: Secondary | ICD-10-CM | POA: Diagnosis not present

## 2023-01-19 DIAGNOSIS — C7800 Secondary malignant neoplasm of unspecified lung: Secondary | ICD-10-CM | POA: Diagnosis not present

## 2023-01-19 DIAGNOSIS — Z87891 Personal history of nicotine dependence: Secondary | ICD-10-CM | POA: Diagnosis not present

## 2023-01-19 DIAGNOSIS — C7931 Secondary malignant neoplasm of brain: Secondary | ICD-10-CM | POA: Diagnosis not present

## 2023-01-19 DIAGNOSIS — C7982 Secondary malignant neoplasm of genital organs: Secondary | ICD-10-CM | POA: Diagnosis not present

## 2023-01-19 DIAGNOSIS — D649 Anemia, unspecified: Secondary | ICD-10-CM | POA: Diagnosis not present

## 2023-01-19 DIAGNOSIS — F419 Anxiety disorder, unspecified: Secondary | ICD-10-CM | POA: Diagnosis not present

## 2023-01-19 DIAGNOSIS — M5126 Other intervertebral disc displacement, lumbar region: Secondary | ICD-10-CM | POA: Diagnosis not present

## 2023-01-19 DIAGNOSIS — E039 Hypothyroidism, unspecified: Secondary | ICD-10-CM | POA: Diagnosis not present

## 2023-01-19 DIAGNOSIS — E441 Mild protein-calorie malnutrition: Secondary | ICD-10-CM | POA: Diagnosis not present

## 2023-01-19 DIAGNOSIS — C7951 Secondary malignant neoplasm of bone: Secondary | ICD-10-CM | POA: Diagnosis not present

## 2023-01-19 DIAGNOSIS — C539 Malignant neoplasm of cervix uteri, unspecified: Secondary | ICD-10-CM | POA: Diagnosis not present

## 2023-01-20 DIAGNOSIS — C7931 Secondary malignant neoplasm of brain: Secondary | ICD-10-CM | POA: Diagnosis not present

## 2023-01-20 DIAGNOSIS — E039 Hypothyroidism, unspecified: Secondary | ICD-10-CM | POA: Diagnosis not present

## 2023-01-20 DIAGNOSIS — Z87891 Personal history of nicotine dependence: Secondary | ICD-10-CM | POA: Diagnosis not present

## 2023-01-20 DIAGNOSIS — C7951 Secondary malignant neoplasm of bone: Secondary | ICD-10-CM | POA: Diagnosis not present

## 2023-01-20 DIAGNOSIS — E441 Mild protein-calorie malnutrition: Secondary | ICD-10-CM | POA: Diagnosis not present

## 2023-01-20 DIAGNOSIS — C7800 Secondary malignant neoplasm of unspecified lung: Secondary | ICD-10-CM | POA: Diagnosis not present

## 2023-01-20 DIAGNOSIS — C539 Malignant neoplasm of cervix uteri, unspecified: Secondary | ICD-10-CM | POA: Diagnosis not present

## 2023-01-20 DIAGNOSIS — M5126 Other intervertebral disc displacement, lumbar region: Secondary | ICD-10-CM | POA: Diagnosis not present

## 2023-01-20 DIAGNOSIS — D649 Anemia, unspecified: Secondary | ICD-10-CM | POA: Diagnosis not present

## 2023-01-20 DIAGNOSIS — F419 Anxiety disorder, unspecified: Secondary | ICD-10-CM | POA: Diagnosis not present

## 2023-01-20 DIAGNOSIS — C7982 Secondary malignant neoplasm of genital organs: Secondary | ICD-10-CM | POA: Diagnosis not present

## 2023-01-21 DIAGNOSIS — C7951 Secondary malignant neoplasm of bone: Secondary | ICD-10-CM | POA: Diagnosis not present

## 2023-01-21 DIAGNOSIS — Z87891 Personal history of nicotine dependence: Secondary | ICD-10-CM | POA: Diagnosis not present

## 2023-01-21 DIAGNOSIS — C7982 Secondary malignant neoplasm of genital organs: Secondary | ICD-10-CM | POA: Diagnosis not present

## 2023-01-21 DIAGNOSIS — E039 Hypothyroidism, unspecified: Secondary | ICD-10-CM | POA: Diagnosis not present

## 2023-01-21 DIAGNOSIS — F419 Anxiety disorder, unspecified: Secondary | ICD-10-CM | POA: Diagnosis not present

## 2023-01-21 DIAGNOSIS — C7800 Secondary malignant neoplasm of unspecified lung: Secondary | ICD-10-CM | POA: Diagnosis not present

## 2023-01-21 DIAGNOSIS — D649 Anemia, unspecified: Secondary | ICD-10-CM | POA: Diagnosis not present

## 2023-01-21 DIAGNOSIS — C7931 Secondary malignant neoplasm of brain: Secondary | ICD-10-CM | POA: Diagnosis not present

## 2023-01-21 DIAGNOSIS — M5126 Other intervertebral disc displacement, lumbar region: Secondary | ICD-10-CM | POA: Diagnosis not present

## 2023-01-21 DIAGNOSIS — C539 Malignant neoplasm of cervix uteri, unspecified: Secondary | ICD-10-CM | POA: Diagnosis not present

## 2023-01-21 DIAGNOSIS — E441 Mild protein-calorie malnutrition: Secondary | ICD-10-CM | POA: Diagnosis not present

## 2023-01-22 DIAGNOSIS — E039 Hypothyroidism, unspecified: Secondary | ICD-10-CM | POA: Diagnosis not present

## 2023-01-22 DIAGNOSIS — C7951 Secondary malignant neoplasm of bone: Secondary | ICD-10-CM | POA: Diagnosis not present

## 2023-01-22 DIAGNOSIS — C7982 Secondary malignant neoplasm of genital organs: Secondary | ICD-10-CM | POA: Diagnosis not present

## 2023-01-22 DIAGNOSIS — C539 Malignant neoplasm of cervix uteri, unspecified: Secondary | ICD-10-CM | POA: Diagnosis not present

## 2023-01-22 DIAGNOSIS — C7931 Secondary malignant neoplasm of brain: Secondary | ICD-10-CM | POA: Diagnosis not present

## 2023-01-22 DIAGNOSIS — D649 Anemia, unspecified: Secondary | ICD-10-CM | POA: Diagnosis not present

## 2023-01-22 DIAGNOSIS — C7800 Secondary malignant neoplasm of unspecified lung: Secondary | ICD-10-CM | POA: Diagnosis not present

## 2023-01-22 DIAGNOSIS — F419 Anxiety disorder, unspecified: Secondary | ICD-10-CM | POA: Diagnosis not present

## 2023-01-22 DIAGNOSIS — M5126 Other intervertebral disc displacement, lumbar region: Secondary | ICD-10-CM | POA: Diagnosis not present

## 2023-01-22 DIAGNOSIS — E441 Mild protein-calorie malnutrition: Secondary | ICD-10-CM | POA: Diagnosis not present

## 2023-01-22 DIAGNOSIS — Z87891 Personal history of nicotine dependence: Secondary | ICD-10-CM | POA: Diagnosis not present

## 2023-01-22 NOTE — Progress Notes (Signed)
Returned call to Larina Earthly, Education officer, museum at [employer] who is trying to fill out long term disability paper work. Given treatment dates and Dr. Alvy Bimler appt date after getting permission from [facility].

## 2023-01-23 DIAGNOSIS — C7800 Secondary malignant neoplasm of unspecified lung: Secondary | ICD-10-CM | POA: Diagnosis not present

## 2023-01-23 DIAGNOSIS — D649 Anemia, unspecified: Secondary | ICD-10-CM | POA: Diagnosis not present

## 2023-01-23 DIAGNOSIS — E039 Hypothyroidism, unspecified: Secondary | ICD-10-CM | POA: Diagnosis not present

## 2023-01-23 DIAGNOSIS — C7931 Secondary malignant neoplasm of brain: Secondary | ICD-10-CM | POA: Diagnosis not present

## 2023-01-23 DIAGNOSIS — M5126 Other intervertebral disc displacement, lumbar region: Secondary | ICD-10-CM | POA: Diagnosis not present

## 2023-01-23 DIAGNOSIS — C7951 Secondary malignant neoplasm of bone: Secondary | ICD-10-CM | POA: Diagnosis not present

## 2023-01-23 DIAGNOSIS — Z87891 Personal history of nicotine dependence: Secondary | ICD-10-CM | POA: Diagnosis not present

## 2023-01-23 DIAGNOSIS — F419 Anxiety disorder, unspecified: Secondary | ICD-10-CM | POA: Diagnosis not present

## 2023-01-23 DIAGNOSIS — E441 Mild protein-calorie malnutrition: Secondary | ICD-10-CM | POA: Diagnosis not present

## 2023-01-23 DIAGNOSIS — C7982 Secondary malignant neoplasm of genital organs: Secondary | ICD-10-CM | POA: Diagnosis not present

## 2023-01-23 DIAGNOSIS — C539 Malignant neoplasm of cervix uteri, unspecified: Secondary | ICD-10-CM | POA: Diagnosis not present

## 2023-01-24 DIAGNOSIS — E039 Hypothyroidism, unspecified: Secondary | ICD-10-CM | POA: Diagnosis not present

## 2023-01-24 DIAGNOSIS — M5126 Other intervertebral disc displacement, lumbar region: Secondary | ICD-10-CM | POA: Diagnosis not present

## 2023-01-24 DIAGNOSIS — Z87891 Personal history of nicotine dependence: Secondary | ICD-10-CM | POA: Diagnosis not present

## 2023-01-24 DIAGNOSIS — D649 Anemia, unspecified: Secondary | ICD-10-CM | POA: Diagnosis not present

## 2023-01-24 DIAGNOSIS — C539 Malignant neoplasm of cervix uteri, unspecified: Secondary | ICD-10-CM | POA: Diagnosis not present

## 2023-01-24 DIAGNOSIS — C7951 Secondary malignant neoplasm of bone: Secondary | ICD-10-CM | POA: Diagnosis not present

## 2023-01-24 DIAGNOSIS — C7800 Secondary malignant neoplasm of unspecified lung: Secondary | ICD-10-CM | POA: Diagnosis not present

## 2023-01-24 DIAGNOSIS — C7982 Secondary malignant neoplasm of genital organs: Secondary | ICD-10-CM | POA: Diagnosis not present

## 2023-01-24 DIAGNOSIS — E441 Mild protein-calorie malnutrition: Secondary | ICD-10-CM | POA: Diagnosis not present

## 2023-01-24 DIAGNOSIS — C7931 Secondary malignant neoplasm of brain: Secondary | ICD-10-CM | POA: Diagnosis not present

## 2023-01-24 DIAGNOSIS — F419 Anxiety disorder, unspecified: Secondary | ICD-10-CM | POA: Diagnosis not present

## 2023-01-25 DIAGNOSIS — D649 Anemia, unspecified: Secondary | ICD-10-CM | POA: Diagnosis not present

## 2023-01-25 DIAGNOSIS — M5126 Other intervertebral disc displacement, lumbar region: Secondary | ICD-10-CM | POA: Diagnosis not present

## 2023-01-25 DIAGNOSIS — C7982 Secondary malignant neoplasm of genital organs: Secondary | ICD-10-CM | POA: Diagnosis not present

## 2023-01-25 DIAGNOSIS — C7800 Secondary malignant neoplasm of unspecified lung: Secondary | ICD-10-CM | POA: Diagnosis not present

## 2023-01-25 DIAGNOSIS — C539 Malignant neoplasm of cervix uteri, unspecified: Secondary | ICD-10-CM | POA: Diagnosis not present

## 2023-01-25 DIAGNOSIS — E039 Hypothyroidism, unspecified: Secondary | ICD-10-CM | POA: Diagnosis not present

## 2023-01-25 DIAGNOSIS — E441 Mild protein-calorie malnutrition: Secondary | ICD-10-CM | POA: Diagnosis not present

## 2023-01-25 DIAGNOSIS — C7951 Secondary malignant neoplasm of bone: Secondary | ICD-10-CM | POA: Diagnosis not present

## 2023-01-25 DIAGNOSIS — C7931 Secondary malignant neoplasm of brain: Secondary | ICD-10-CM | POA: Diagnosis not present

## 2023-01-25 DIAGNOSIS — F419 Anxiety disorder, unspecified: Secondary | ICD-10-CM | POA: Diagnosis not present

## 2023-01-25 DIAGNOSIS — Z87891 Personal history of nicotine dependence: Secondary | ICD-10-CM | POA: Diagnosis not present

## 2023-01-26 DIAGNOSIS — C7982 Secondary malignant neoplasm of genital organs: Secondary | ICD-10-CM | POA: Diagnosis not present

## 2023-01-26 DIAGNOSIS — C539 Malignant neoplasm of cervix uteri, unspecified: Secondary | ICD-10-CM | POA: Diagnosis not present

## 2023-01-26 DIAGNOSIS — F419 Anxiety disorder, unspecified: Secondary | ICD-10-CM | POA: Diagnosis not present

## 2023-01-26 DIAGNOSIS — Z87891 Personal history of nicotine dependence: Secondary | ICD-10-CM | POA: Diagnosis not present

## 2023-01-26 DIAGNOSIS — M5126 Other intervertebral disc displacement, lumbar region: Secondary | ICD-10-CM | POA: Diagnosis not present

## 2023-01-26 DIAGNOSIS — C7951 Secondary malignant neoplasm of bone: Secondary | ICD-10-CM | POA: Diagnosis not present

## 2023-01-26 DIAGNOSIS — C7800 Secondary malignant neoplasm of unspecified lung: Secondary | ICD-10-CM | POA: Diagnosis not present

## 2023-01-26 DIAGNOSIS — E441 Mild protein-calorie malnutrition: Secondary | ICD-10-CM | POA: Diagnosis not present

## 2023-01-26 DIAGNOSIS — E039 Hypothyroidism, unspecified: Secondary | ICD-10-CM | POA: Diagnosis not present

## 2023-01-26 DIAGNOSIS — D649 Anemia, unspecified: Secondary | ICD-10-CM | POA: Diagnosis not present

## 2023-01-26 DIAGNOSIS — C7931 Secondary malignant neoplasm of brain: Secondary | ICD-10-CM | POA: Diagnosis not present

## 2023-01-27 DIAGNOSIS — F419 Anxiety disorder, unspecified: Secondary | ICD-10-CM | POA: Diagnosis not present

## 2023-01-27 DIAGNOSIS — C7931 Secondary malignant neoplasm of brain: Secondary | ICD-10-CM | POA: Diagnosis not present

## 2023-01-27 DIAGNOSIS — E441 Mild protein-calorie malnutrition: Secondary | ICD-10-CM | POA: Diagnosis not present

## 2023-01-27 DIAGNOSIS — C7800 Secondary malignant neoplasm of unspecified lung: Secondary | ICD-10-CM | POA: Diagnosis not present

## 2023-01-27 DIAGNOSIS — C7982 Secondary malignant neoplasm of genital organs: Secondary | ICD-10-CM | POA: Diagnosis not present

## 2023-01-27 DIAGNOSIS — C7951 Secondary malignant neoplasm of bone: Secondary | ICD-10-CM | POA: Diagnosis not present

## 2023-01-27 DIAGNOSIS — C539 Malignant neoplasm of cervix uteri, unspecified: Secondary | ICD-10-CM | POA: Diagnosis not present

## 2023-01-27 DIAGNOSIS — Z87891 Personal history of nicotine dependence: Secondary | ICD-10-CM | POA: Diagnosis not present

## 2023-01-27 DIAGNOSIS — D649 Anemia, unspecified: Secondary | ICD-10-CM | POA: Diagnosis not present

## 2023-01-27 DIAGNOSIS — M5126 Other intervertebral disc displacement, lumbar region: Secondary | ICD-10-CM | POA: Diagnosis not present

## 2023-01-27 DIAGNOSIS — E039 Hypothyroidism, unspecified: Secondary | ICD-10-CM | POA: Diagnosis not present

## 2023-01-28 DIAGNOSIS — E039 Hypothyroidism, unspecified: Secondary | ICD-10-CM | POA: Diagnosis not present

## 2023-01-28 DIAGNOSIS — E441 Mild protein-calorie malnutrition: Secondary | ICD-10-CM | POA: Diagnosis not present

## 2023-01-28 DIAGNOSIS — C7951 Secondary malignant neoplasm of bone: Secondary | ICD-10-CM | POA: Diagnosis not present

## 2023-01-28 DIAGNOSIS — Z87891 Personal history of nicotine dependence: Secondary | ICD-10-CM | POA: Diagnosis not present

## 2023-01-28 DIAGNOSIS — D649 Anemia, unspecified: Secondary | ICD-10-CM | POA: Diagnosis not present

## 2023-01-28 DIAGNOSIS — F419 Anxiety disorder, unspecified: Secondary | ICD-10-CM | POA: Diagnosis not present

## 2023-01-28 DIAGNOSIS — M5126 Other intervertebral disc displacement, lumbar region: Secondary | ICD-10-CM | POA: Diagnosis not present

## 2023-01-28 DIAGNOSIS — C7982 Secondary malignant neoplasm of genital organs: Secondary | ICD-10-CM | POA: Diagnosis not present

## 2023-01-28 DIAGNOSIS — C539 Malignant neoplasm of cervix uteri, unspecified: Secondary | ICD-10-CM | POA: Diagnosis not present

## 2023-01-28 DIAGNOSIS — C7800 Secondary malignant neoplasm of unspecified lung: Secondary | ICD-10-CM | POA: Diagnosis not present

## 2023-01-28 DIAGNOSIS — C7931 Secondary malignant neoplasm of brain: Secondary | ICD-10-CM | POA: Diagnosis not present

## 2023-01-29 DIAGNOSIS — F419 Anxiety disorder, unspecified: Secondary | ICD-10-CM | POA: Diagnosis not present

## 2023-01-29 DIAGNOSIS — M5126 Other intervertebral disc displacement, lumbar region: Secondary | ICD-10-CM | POA: Diagnosis not present

## 2023-01-29 DIAGNOSIS — C7951 Secondary malignant neoplasm of bone: Secondary | ICD-10-CM | POA: Diagnosis not present

## 2023-01-29 DIAGNOSIS — D649 Anemia, unspecified: Secondary | ICD-10-CM | POA: Diagnosis not present

## 2023-01-29 DIAGNOSIS — E039 Hypothyroidism, unspecified: Secondary | ICD-10-CM | POA: Diagnosis not present

## 2023-01-29 DIAGNOSIS — Z87891 Personal history of nicotine dependence: Secondary | ICD-10-CM | POA: Diagnosis not present

## 2023-01-29 DIAGNOSIS — C7931 Secondary malignant neoplasm of brain: Secondary | ICD-10-CM | POA: Diagnosis not present

## 2023-01-29 DIAGNOSIS — E441 Mild protein-calorie malnutrition: Secondary | ICD-10-CM | POA: Diagnosis not present

## 2023-01-29 DIAGNOSIS — C7800 Secondary malignant neoplasm of unspecified lung: Secondary | ICD-10-CM | POA: Diagnosis not present

## 2023-01-29 DIAGNOSIS — C539 Malignant neoplasm of cervix uteri, unspecified: Secondary | ICD-10-CM | POA: Diagnosis not present

## 2023-01-29 DIAGNOSIS — C7982 Secondary malignant neoplasm of genital organs: Secondary | ICD-10-CM | POA: Diagnosis not present

## 2023-01-30 DIAGNOSIS — C7931 Secondary malignant neoplasm of brain: Secondary | ICD-10-CM | POA: Diagnosis not present

## 2023-01-30 DIAGNOSIS — C539 Malignant neoplasm of cervix uteri, unspecified: Secondary | ICD-10-CM | POA: Diagnosis not present

## 2023-01-30 DIAGNOSIS — E039 Hypothyroidism, unspecified: Secondary | ICD-10-CM | POA: Diagnosis not present

## 2023-01-30 DIAGNOSIS — C7951 Secondary malignant neoplasm of bone: Secondary | ICD-10-CM | POA: Diagnosis not present

## 2023-01-30 DIAGNOSIS — C7800 Secondary malignant neoplasm of unspecified lung: Secondary | ICD-10-CM | POA: Diagnosis not present

## 2023-01-30 DIAGNOSIS — M5126 Other intervertebral disc displacement, lumbar region: Secondary | ICD-10-CM | POA: Diagnosis not present

## 2023-01-30 DIAGNOSIS — D649 Anemia, unspecified: Secondary | ICD-10-CM | POA: Diagnosis not present

## 2023-01-30 DIAGNOSIS — F419 Anxiety disorder, unspecified: Secondary | ICD-10-CM | POA: Diagnosis not present

## 2023-01-30 DIAGNOSIS — E441 Mild protein-calorie malnutrition: Secondary | ICD-10-CM | POA: Diagnosis not present

## 2023-01-30 DIAGNOSIS — Z87891 Personal history of nicotine dependence: Secondary | ICD-10-CM | POA: Diagnosis not present

## 2023-01-30 DIAGNOSIS — C7982 Secondary malignant neoplasm of genital organs: Secondary | ICD-10-CM | POA: Diagnosis not present

## 2023-01-31 DIAGNOSIS — Z87891 Personal history of nicotine dependence: Secondary | ICD-10-CM | POA: Diagnosis not present

## 2023-01-31 DIAGNOSIS — D649 Anemia, unspecified: Secondary | ICD-10-CM | POA: Diagnosis not present

## 2023-01-31 DIAGNOSIS — E039 Hypothyroidism, unspecified: Secondary | ICD-10-CM | POA: Diagnosis not present

## 2023-01-31 DIAGNOSIS — E441 Mild protein-calorie malnutrition: Secondary | ICD-10-CM | POA: Diagnosis not present

## 2023-01-31 DIAGNOSIS — C7931 Secondary malignant neoplasm of brain: Secondary | ICD-10-CM | POA: Diagnosis not present

## 2023-01-31 DIAGNOSIS — F419 Anxiety disorder, unspecified: Secondary | ICD-10-CM | POA: Diagnosis not present

## 2023-01-31 DIAGNOSIS — C7982 Secondary malignant neoplasm of genital organs: Secondary | ICD-10-CM | POA: Diagnosis not present

## 2023-01-31 DIAGNOSIS — C7800 Secondary malignant neoplasm of unspecified lung: Secondary | ICD-10-CM | POA: Diagnosis not present

## 2023-01-31 DIAGNOSIS — C7951 Secondary malignant neoplasm of bone: Secondary | ICD-10-CM | POA: Diagnosis not present

## 2023-01-31 DIAGNOSIS — M5126 Other intervertebral disc displacement, lumbar region: Secondary | ICD-10-CM | POA: Diagnosis not present

## 2023-01-31 DIAGNOSIS — C539 Malignant neoplasm of cervix uteri, unspecified: Secondary | ICD-10-CM | POA: Diagnosis not present

## 2023-02-01 DIAGNOSIS — C7951 Secondary malignant neoplasm of bone: Secondary | ICD-10-CM | POA: Diagnosis not present

## 2023-02-01 DIAGNOSIS — Z87891 Personal history of nicotine dependence: Secondary | ICD-10-CM | POA: Diagnosis not present

## 2023-02-01 DIAGNOSIS — D649 Anemia, unspecified: Secondary | ICD-10-CM | POA: Diagnosis not present

## 2023-02-01 DIAGNOSIS — C7800 Secondary malignant neoplasm of unspecified lung: Secondary | ICD-10-CM | POA: Diagnosis not present

## 2023-02-01 DIAGNOSIS — C7931 Secondary malignant neoplasm of brain: Secondary | ICD-10-CM | POA: Diagnosis not present

## 2023-02-01 DIAGNOSIS — M5126 Other intervertebral disc displacement, lumbar region: Secondary | ICD-10-CM | POA: Diagnosis not present

## 2023-02-01 DIAGNOSIS — C539 Malignant neoplasm of cervix uteri, unspecified: Secondary | ICD-10-CM | POA: Diagnosis not present

## 2023-02-01 DIAGNOSIS — F419 Anxiety disorder, unspecified: Secondary | ICD-10-CM | POA: Diagnosis not present

## 2023-02-01 DIAGNOSIS — E441 Mild protein-calorie malnutrition: Secondary | ICD-10-CM | POA: Diagnosis not present

## 2023-02-01 DIAGNOSIS — C7982 Secondary malignant neoplasm of genital organs: Secondary | ICD-10-CM | POA: Diagnosis not present

## 2023-02-01 DIAGNOSIS — E039 Hypothyroidism, unspecified: Secondary | ICD-10-CM | POA: Diagnosis not present

## 2023-02-02 DIAGNOSIS — C539 Malignant neoplasm of cervix uteri, unspecified: Secondary | ICD-10-CM | POA: Diagnosis not present

## 2023-02-02 DIAGNOSIS — Z87891 Personal history of nicotine dependence: Secondary | ICD-10-CM | POA: Diagnosis not present

## 2023-02-02 DIAGNOSIS — C7982 Secondary malignant neoplasm of genital organs: Secondary | ICD-10-CM | POA: Diagnosis not present

## 2023-02-02 DIAGNOSIS — E441 Mild protein-calorie malnutrition: Secondary | ICD-10-CM | POA: Diagnosis not present

## 2023-02-02 DIAGNOSIS — C7931 Secondary malignant neoplasm of brain: Secondary | ICD-10-CM | POA: Diagnosis not present

## 2023-02-02 DIAGNOSIS — D649 Anemia, unspecified: Secondary | ICD-10-CM | POA: Diagnosis not present

## 2023-02-02 DIAGNOSIS — C7951 Secondary malignant neoplasm of bone: Secondary | ICD-10-CM | POA: Diagnosis not present

## 2023-02-02 DIAGNOSIS — C7800 Secondary malignant neoplasm of unspecified lung: Secondary | ICD-10-CM | POA: Diagnosis not present

## 2023-02-02 DIAGNOSIS — F419 Anxiety disorder, unspecified: Secondary | ICD-10-CM | POA: Diagnosis not present

## 2023-02-02 DIAGNOSIS — M5126 Other intervertebral disc displacement, lumbar region: Secondary | ICD-10-CM | POA: Diagnosis not present

## 2023-02-02 DIAGNOSIS — E039 Hypothyroidism, unspecified: Secondary | ICD-10-CM | POA: Diagnosis not present

## 2023-02-03 DIAGNOSIS — C7951 Secondary malignant neoplasm of bone: Secondary | ICD-10-CM | POA: Diagnosis not present

## 2023-02-03 DIAGNOSIS — C7931 Secondary malignant neoplasm of brain: Secondary | ICD-10-CM | POA: Diagnosis not present

## 2023-02-03 DIAGNOSIS — C7800 Secondary malignant neoplasm of unspecified lung: Secondary | ICD-10-CM | POA: Diagnosis not present

## 2023-02-03 DIAGNOSIS — Z87891 Personal history of nicotine dependence: Secondary | ICD-10-CM | POA: Diagnosis not present

## 2023-02-03 DIAGNOSIS — D649 Anemia, unspecified: Secondary | ICD-10-CM | POA: Diagnosis not present

## 2023-02-03 DIAGNOSIS — C539 Malignant neoplasm of cervix uteri, unspecified: Secondary | ICD-10-CM | POA: Diagnosis not present

## 2023-02-03 DIAGNOSIS — E039 Hypothyroidism, unspecified: Secondary | ICD-10-CM | POA: Diagnosis not present

## 2023-02-03 DIAGNOSIS — F419 Anxiety disorder, unspecified: Secondary | ICD-10-CM | POA: Diagnosis not present

## 2023-02-03 DIAGNOSIS — E441 Mild protein-calorie malnutrition: Secondary | ICD-10-CM | POA: Diagnosis not present

## 2023-02-03 DIAGNOSIS — M5126 Other intervertebral disc displacement, lumbar region: Secondary | ICD-10-CM | POA: Diagnosis not present

## 2023-02-03 DIAGNOSIS — C7982 Secondary malignant neoplasm of genital organs: Secondary | ICD-10-CM | POA: Diagnosis not present

## 2023-02-04 DIAGNOSIS — E039 Hypothyroidism, unspecified: Secondary | ICD-10-CM | POA: Diagnosis not present

## 2023-02-04 DIAGNOSIS — Z87891 Personal history of nicotine dependence: Secondary | ICD-10-CM | POA: Diagnosis not present

## 2023-02-04 DIAGNOSIS — F419 Anxiety disorder, unspecified: Secondary | ICD-10-CM | POA: Diagnosis not present

## 2023-02-04 DIAGNOSIS — C7931 Secondary malignant neoplasm of brain: Secondary | ICD-10-CM | POA: Diagnosis not present

## 2023-02-04 DIAGNOSIS — D649 Anemia, unspecified: Secondary | ICD-10-CM | POA: Diagnosis not present

## 2023-02-04 DIAGNOSIS — C539 Malignant neoplasm of cervix uteri, unspecified: Secondary | ICD-10-CM | POA: Diagnosis not present

## 2023-02-04 DIAGNOSIS — E441 Mild protein-calorie malnutrition: Secondary | ICD-10-CM | POA: Diagnosis not present

## 2023-02-04 DIAGNOSIS — C7800 Secondary malignant neoplasm of unspecified lung: Secondary | ICD-10-CM | POA: Diagnosis not present

## 2023-02-04 DIAGNOSIS — M5126 Other intervertebral disc displacement, lumbar region: Secondary | ICD-10-CM | POA: Diagnosis not present

## 2023-02-04 DIAGNOSIS — C7982 Secondary malignant neoplasm of genital organs: Secondary | ICD-10-CM | POA: Diagnosis not present

## 2023-02-04 DIAGNOSIS — C7951 Secondary malignant neoplasm of bone: Secondary | ICD-10-CM | POA: Diagnosis not present

## 2023-02-05 DIAGNOSIS — E441 Mild protein-calorie malnutrition: Secondary | ICD-10-CM | POA: Diagnosis not present

## 2023-02-05 DIAGNOSIS — M5126 Other intervertebral disc displacement, lumbar region: Secondary | ICD-10-CM | POA: Diagnosis not present

## 2023-02-05 DIAGNOSIS — F419 Anxiety disorder, unspecified: Secondary | ICD-10-CM | POA: Diagnosis not present

## 2023-02-05 DIAGNOSIS — E039 Hypothyroidism, unspecified: Secondary | ICD-10-CM | POA: Diagnosis not present

## 2023-02-05 DIAGNOSIS — C7951 Secondary malignant neoplasm of bone: Secondary | ICD-10-CM | POA: Diagnosis not present

## 2023-02-05 DIAGNOSIS — C7982 Secondary malignant neoplasm of genital organs: Secondary | ICD-10-CM | POA: Diagnosis not present

## 2023-02-05 DIAGNOSIS — C7931 Secondary malignant neoplasm of brain: Secondary | ICD-10-CM | POA: Diagnosis not present

## 2023-02-05 DIAGNOSIS — C7800 Secondary malignant neoplasm of unspecified lung: Secondary | ICD-10-CM | POA: Diagnosis not present

## 2023-02-05 DIAGNOSIS — D649 Anemia, unspecified: Secondary | ICD-10-CM | POA: Diagnosis not present

## 2023-02-05 DIAGNOSIS — C539 Malignant neoplasm of cervix uteri, unspecified: Secondary | ICD-10-CM | POA: Diagnosis not present

## 2023-02-05 DIAGNOSIS — Z87891 Personal history of nicotine dependence: Secondary | ICD-10-CM | POA: Diagnosis not present

## 2023-02-06 DIAGNOSIS — C7800 Secondary malignant neoplasm of unspecified lung: Secondary | ICD-10-CM | POA: Diagnosis not present

## 2023-02-06 DIAGNOSIS — E039 Hypothyroidism, unspecified: Secondary | ICD-10-CM | POA: Diagnosis not present

## 2023-02-06 DIAGNOSIS — C7931 Secondary malignant neoplasm of brain: Secondary | ICD-10-CM | POA: Diagnosis not present

## 2023-02-06 DIAGNOSIS — M5126 Other intervertebral disc displacement, lumbar region: Secondary | ICD-10-CM | POA: Diagnosis not present

## 2023-02-06 DIAGNOSIS — C7982 Secondary malignant neoplasm of genital organs: Secondary | ICD-10-CM | POA: Diagnosis not present

## 2023-02-06 DIAGNOSIS — C7951 Secondary malignant neoplasm of bone: Secondary | ICD-10-CM | POA: Diagnosis not present

## 2023-02-06 DIAGNOSIS — E441 Mild protein-calorie malnutrition: Secondary | ICD-10-CM | POA: Diagnosis not present

## 2023-02-06 DIAGNOSIS — D649 Anemia, unspecified: Secondary | ICD-10-CM | POA: Diagnosis not present

## 2023-02-06 DIAGNOSIS — F419 Anxiety disorder, unspecified: Secondary | ICD-10-CM | POA: Diagnosis not present

## 2023-02-06 DIAGNOSIS — C539 Malignant neoplasm of cervix uteri, unspecified: Secondary | ICD-10-CM | POA: Diagnosis not present

## 2023-02-06 DIAGNOSIS — Z87891 Personal history of nicotine dependence: Secondary | ICD-10-CM | POA: Diagnosis not present

## 2023-02-07 DIAGNOSIS — C7800 Secondary malignant neoplasm of unspecified lung: Secondary | ICD-10-CM | POA: Diagnosis not present

## 2023-02-07 DIAGNOSIS — Z87891 Personal history of nicotine dependence: Secondary | ICD-10-CM | POA: Diagnosis not present

## 2023-02-07 DIAGNOSIS — F419 Anxiety disorder, unspecified: Secondary | ICD-10-CM | POA: Diagnosis not present

## 2023-02-07 DIAGNOSIS — E441 Mild protein-calorie malnutrition: Secondary | ICD-10-CM | POA: Diagnosis not present

## 2023-02-07 DIAGNOSIS — E039 Hypothyroidism, unspecified: Secondary | ICD-10-CM | POA: Diagnosis not present

## 2023-02-07 DIAGNOSIS — C7982 Secondary malignant neoplasm of genital organs: Secondary | ICD-10-CM | POA: Diagnosis not present

## 2023-02-07 DIAGNOSIS — C7931 Secondary malignant neoplasm of brain: Secondary | ICD-10-CM | POA: Diagnosis not present

## 2023-02-07 DIAGNOSIS — M5126 Other intervertebral disc displacement, lumbar region: Secondary | ICD-10-CM | POA: Diagnosis not present

## 2023-02-07 DIAGNOSIS — D649 Anemia, unspecified: Secondary | ICD-10-CM | POA: Diagnosis not present

## 2023-02-07 DIAGNOSIS — C7951 Secondary malignant neoplasm of bone: Secondary | ICD-10-CM | POA: Diagnosis not present

## 2023-02-07 DIAGNOSIS — C539 Malignant neoplasm of cervix uteri, unspecified: Secondary | ICD-10-CM | POA: Diagnosis not present

## 2023-02-08 DIAGNOSIS — E039 Hypothyroidism, unspecified: Secondary | ICD-10-CM | POA: Diagnosis not present

## 2023-02-08 DIAGNOSIS — M5126 Other intervertebral disc displacement, lumbar region: Secondary | ICD-10-CM | POA: Diagnosis not present

## 2023-02-08 DIAGNOSIS — C7951 Secondary malignant neoplasm of bone: Secondary | ICD-10-CM | POA: Diagnosis not present

## 2023-02-08 DIAGNOSIS — C7982 Secondary malignant neoplasm of genital organs: Secondary | ICD-10-CM | POA: Diagnosis not present

## 2023-02-08 DIAGNOSIS — C7931 Secondary malignant neoplasm of brain: Secondary | ICD-10-CM | POA: Diagnosis not present

## 2023-02-08 DIAGNOSIS — E441 Mild protein-calorie malnutrition: Secondary | ICD-10-CM | POA: Diagnosis not present

## 2023-02-08 DIAGNOSIS — F419 Anxiety disorder, unspecified: Secondary | ICD-10-CM | POA: Diagnosis not present

## 2023-02-08 DIAGNOSIS — C539 Malignant neoplasm of cervix uteri, unspecified: Secondary | ICD-10-CM | POA: Diagnosis not present

## 2023-02-08 DIAGNOSIS — C7800 Secondary malignant neoplasm of unspecified lung: Secondary | ICD-10-CM | POA: Diagnosis not present

## 2023-02-08 DIAGNOSIS — D649 Anemia, unspecified: Secondary | ICD-10-CM | POA: Diagnosis not present

## 2023-02-08 DIAGNOSIS — Z87891 Personal history of nicotine dependence: Secondary | ICD-10-CM | POA: Diagnosis not present

## 2023-02-09 DIAGNOSIS — E039 Hypothyroidism, unspecified: Secondary | ICD-10-CM | POA: Diagnosis not present

## 2023-02-09 DIAGNOSIS — C7800 Secondary malignant neoplasm of unspecified lung: Secondary | ICD-10-CM | POA: Diagnosis not present

## 2023-02-09 DIAGNOSIS — C539 Malignant neoplasm of cervix uteri, unspecified: Secondary | ICD-10-CM | POA: Diagnosis not present

## 2023-02-09 DIAGNOSIS — F419 Anxiety disorder, unspecified: Secondary | ICD-10-CM | POA: Diagnosis not present

## 2023-02-09 DIAGNOSIS — E441 Mild protein-calorie malnutrition: Secondary | ICD-10-CM | POA: Diagnosis not present

## 2023-02-09 DIAGNOSIS — Z87891 Personal history of nicotine dependence: Secondary | ICD-10-CM | POA: Diagnosis not present

## 2023-02-09 DIAGNOSIS — C7931 Secondary malignant neoplasm of brain: Secondary | ICD-10-CM | POA: Diagnosis not present

## 2023-02-09 DIAGNOSIS — C7982 Secondary malignant neoplasm of genital organs: Secondary | ICD-10-CM | POA: Diagnosis not present

## 2023-02-09 DIAGNOSIS — C7951 Secondary malignant neoplasm of bone: Secondary | ICD-10-CM | POA: Diagnosis not present

## 2023-02-09 DIAGNOSIS — D649 Anemia, unspecified: Secondary | ICD-10-CM | POA: Diagnosis not present

## 2023-02-09 DIAGNOSIS — M5126 Other intervertebral disc displacement, lumbar region: Secondary | ICD-10-CM | POA: Diagnosis not present

## 2023-02-10 DIAGNOSIS — C7951 Secondary malignant neoplasm of bone: Secondary | ICD-10-CM | POA: Diagnosis not present

## 2023-02-10 DIAGNOSIS — F419 Anxiety disorder, unspecified: Secondary | ICD-10-CM | POA: Diagnosis not present

## 2023-02-10 DIAGNOSIS — Z87891 Personal history of nicotine dependence: Secondary | ICD-10-CM | POA: Diagnosis not present

## 2023-02-10 DIAGNOSIS — E039 Hypothyroidism, unspecified: Secondary | ICD-10-CM | POA: Diagnosis not present

## 2023-02-10 DIAGNOSIS — M5126 Other intervertebral disc displacement, lumbar region: Secondary | ICD-10-CM | POA: Diagnosis not present

## 2023-02-10 DIAGNOSIS — E441 Mild protein-calorie malnutrition: Secondary | ICD-10-CM | POA: Diagnosis not present

## 2023-02-10 DIAGNOSIS — C539 Malignant neoplasm of cervix uteri, unspecified: Secondary | ICD-10-CM | POA: Diagnosis not present

## 2023-02-10 DIAGNOSIS — C7982 Secondary malignant neoplasm of genital organs: Secondary | ICD-10-CM | POA: Diagnosis not present

## 2023-02-10 DIAGNOSIS — C7800 Secondary malignant neoplasm of unspecified lung: Secondary | ICD-10-CM | POA: Diagnosis not present

## 2023-02-10 DIAGNOSIS — C7931 Secondary malignant neoplasm of brain: Secondary | ICD-10-CM | POA: Diagnosis not present

## 2023-02-10 DIAGNOSIS — D649 Anemia, unspecified: Secondary | ICD-10-CM | POA: Diagnosis not present

## 2023-02-11 DIAGNOSIS — Z87891 Personal history of nicotine dependence: Secondary | ICD-10-CM | POA: Diagnosis not present

## 2023-02-11 DIAGNOSIS — C539 Malignant neoplasm of cervix uteri, unspecified: Secondary | ICD-10-CM | POA: Diagnosis not present

## 2023-02-11 DIAGNOSIS — C7951 Secondary malignant neoplasm of bone: Secondary | ICD-10-CM | POA: Diagnosis not present

## 2023-02-11 DIAGNOSIS — M5126 Other intervertebral disc displacement, lumbar region: Secondary | ICD-10-CM | POA: Diagnosis not present

## 2023-02-11 DIAGNOSIS — E039 Hypothyroidism, unspecified: Secondary | ICD-10-CM | POA: Diagnosis not present

## 2023-02-11 DIAGNOSIS — D649 Anemia, unspecified: Secondary | ICD-10-CM | POA: Diagnosis not present

## 2023-02-11 DIAGNOSIS — E441 Mild protein-calorie malnutrition: Secondary | ICD-10-CM | POA: Diagnosis not present

## 2023-02-11 DIAGNOSIS — C7931 Secondary malignant neoplasm of brain: Secondary | ICD-10-CM | POA: Diagnosis not present

## 2023-02-11 DIAGNOSIS — F419 Anxiety disorder, unspecified: Secondary | ICD-10-CM | POA: Diagnosis not present

## 2023-02-11 DIAGNOSIS — C7982 Secondary malignant neoplasm of genital organs: Secondary | ICD-10-CM | POA: Diagnosis not present

## 2023-02-11 DIAGNOSIS — C7800 Secondary malignant neoplasm of unspecified lung: Secondary | ICD-10-CM | POA: Diagnosis not present

## 2023-02-12 DIAGNOSIS — M5126 Other intervertebral disc displacement, lumbar region: Secondary | ICD-10-CM | POA: Diagnosis not present

## 2023-02-12 DIAGNOSIS — E039 Hypothyroidism, unspecified: Secondary | ICD-10-CM | POA: Diagnosis not present

## 2023-02-12 DIAGNOSIS — C7800 Secondary malignant neoplasm of unspecified lung: Secondary | ICD-10-CM | POA: Diagnosis not present

## 2023-02-12 DIAGNOSIS — E441 Mild protein-calorie malnutrition: Secondary | ICD-10-CM | POA: Diagnosis not present

## 2023-02-12 DIAGNOSIS — D649 Anemia, unspecified: Secondary | ICD-10-CM | POA: Diagnosis not present

## 2023-02-12 DIAGNOSIS — Z87891 Personal history of nicotine dependence: Secondary | ICD-10-CM | POA: Diagnosis not present

## 2023-02-12 DIAGNOSIS — C7982 Secondary malignant neoplasm of genital organs: Secondary | ICD-10-CM | POA: Diagnosis not present

## 2023-02-12 DIAGNOSIS — C539 Malignant neoplasm of cervix uteri, unspecified: Secondary | ICD-10-CM | POA: Diagnosis not present

## 2023-02-12 DIAGNOSIS — F419 Anxiety disorder, unspecified: Secondary | ICD-10-CM | POA: Diagnosis not present

## 2023-02-12 DIAGNOSIS — C7951 Secondary malignant neoplasm of bone: Secondary | ICD-10-CM | POA: Diagnosis not present

## 2023-02-12 DIAGNOSIS — C7931 Secondary malignant neoplasm of brain: Secondary | ICD-10-CM | POA: Diagnosis not present

## 2023-02-13 DIAGNOSIS — E039 Hypothyroidism, unspecified: Secondary | ICD-10-CM | POA: Diagnosis not present

## 2023-02-13 DIAGNOSIS — D649 Anemia, unspecified: Secondary | ICD-10-CM | POA: Diagnosis not present

## 2023-02-13 DIAGNOSIS — C7982 Secondary malignant neoplasm of genital organs: Secondary | ICD-10-CM | POA: Diagnosis not present

## 2023-02-13 DIAGNOSIS — F419 Anxiety disorder, unspecified: Secondary | ICD-10-CM | POA: Diagnosis not present

## 2023-02-13 DIAGNOSIS — C7951 Secondary malignant neoplasm of bone: Secondary | ICD-10-CM | POA: Diagnosis not present

## 2023-02-13 DIAGNOSIS — C7931 Secondary malignant neoplasm of brain: Secondary | ICD-10-CM | POA: Diagnosis not present

## 2023-02-13 DIAGNOSIS — Z87891 Personal history of nicotine dependence: Secondary | ICD-10-CM | POA: Diagnosis not present

## 2023-02-13 DIAGNOSIS — E441 Mild protein-calorie malnutrition: Secondary | ICD-10-CM | POA: Diagnosis not present

## 2023-02-13 DIAGNOSIS — C539 Malignant neoplasm of cervix uteri, unspecified: Secondary | ICD-10-CM | POA: Diagnosis not present

## 2023-02-13 DIAGNOSIS — C7800 Secondary malignant neoplasm of unspecified lung: Secondary | ICD-10-CM | POA: Diagnosis not present

## 2023-02-13 DIAGNOSIS — M5126 Other intervertebral disc displacement, lumbar region: Secondary | ICD-10-CM | POA: Diagnosis not present

## 2023-02-14 DIAGNOSIS — C7931 Secondary malignant neoplasm of brain: Secondary | ICD-10-CM | POA: Diagnosis not present

## 2023-02-14 DIAGNOSIS — E039 Hypothyroidism, unspecified: Secondary | ICD-10-CM | POA: Diagnosis not present

## 2023-02-14 DIAGNOSIS — C539 Malignant neoplasm of cervix uteri, unspecified: Secondary | ICD-10-CM | POA: Diagnosis not present

## 2023-02-14 DIAGNOSIS — M5126 Other intervertebral disc displacement, lumbar region: Secondary | ICD-10-CM | POA: Diagnosis not present

## 2023-02-14 DIAGNOSIS — C7982 Secondary malignant neoplasm of genital organs: Secondary | ICD-10-CM | POA: Diagnosis not present

## 2023-02-14 DIAGNOSIS — C7951 Secondary malignant neoplasm of bone: Secondary | ICD-10-CM | POA: Diagnosis not present

## 2023-02-14 DIAGNOSIS — C7800 Secondary malignant neoplasm of unspecified lung: Secondary | ICD-10-CM | POA: Diagnosis not present

## 2023-02-14 DIAGNOSIS — Z87891 Personal history of nicotine dependence: Secondary | ICD-10-CM | POA: Diagnosis not present

## 2023-02-14 DIAGNOSIS — D649 Anemia, unspecified: Secondary | ICD-10-CM | POA: Diagnosis not present

## 2023-02-14 DIAGNOSIS — E441 Mild protein-calorie malnutrition: Secondary | ICD-10-CM | POA: Diagnosis not present

## 2023-02-14 DIAGNOSIS — F419 Anxiety disorder, unspecified: Secondary | ICD-10-CM | POA: Diagnosis not present

## 2023-02-15 DIAGNOSIS — C7951 Secondary malignant neoplasm of bone: Secondary | ICD-10-CM | POA: Diagnosis not present

## 2023-02-15 DIAGNOSIS — C539 Malignant neoplasm of cervix uteri, unspecified: Secondary | ICD-10-CM | POA: Diagnosis not present

## 2023-02-15 DIAGNOSIS — E039 Hypothyroidism, unspecified: Secondary | ICD-10-CM | POA: Diagnosis not present

## 2023-02-15 DIAGNOSIS — C7800 Secondary malignant neoplasm of unspecified lung: Secondary | ICD-10-CM | POA: Diagnosis not present

## 2023-02-15 DIAGNOSIS — C7982 Secondary malignant neoplasm of genital organs: Secondary | ICD-10-CM | POA: Diagnosis not present

## 2023-02-15 DIAGNOSIS — F419 Anxiety disorder, unspecified: Secondary | ICD-10-CM | POA: Diagnosis not present

## 2023-02-15 DIAGNOSIS — E441 Mild protein-calorie malnutrition: Secondary | ICD-10-CM | POA: Diagnosis not present

## 2023-02-15 DIAGNOSIS — C7931 Secondary malignant neoplasm of brain: Secondary | ICD-10-CM | POA: Diagnosis not present

## 2023-02-15 DIAGNOSIS — Z87891 Personal history of nicotine dependence: Secondary | ICD-10-CM | POA: Diagnosis not present

## 2023-02-15 DIAGNOSIS — D649 Anemia, unspecified: Secondary | ICD-10-CM | POA: Diagnosis not present

## 2023-02-15 DIAGNOSIS — M5126 Other intervertebral disc displacement, lumbar region: Secondary | ICD-10-CM | POA: Diagnosis not present

## 2023-02-16 DIAGNOSIS — C7931 Secondary malignant neoplasm of brain: Secondary | ICD-10-CM | POA: Diagnosis not present

## 2023-02-16 DIAGNOSIS — E441 Mild protein-calorie malnutrition: Secondary | ICD-10-CM | POA: Diagnosis not present

## 2023-02-16 DIAGNOSIS — Z87891 Personal history of nicotine dependence: Secondary | ICD-10-CM | POA: Diagnosis not present

## 2023-02-16 DIAGNOSIS — C7982 Secondary malignant neoplasm of genital organs: Secondary | ICD-10-CM | POA: Diagnosis not present

## 2023-02-16 DIAGNOSIS — C7951 Secondary malignant neoplasm of bone: Secondary | ICD-10-CM | POA: Diagnosis not present

## 2023-02-16 DIAGNOSIS — C539 Malignant neoplasm of cervix uteri, unspecified: Secondary | ICD-10-CM | POA: Diagnosis not present

## 2023-02-16 DIAGNOSIS — M5126 Other intervertebral disc displacement, lumbar region: Secondary | ICD-10-CM | POA: Diagnosis not present

## 2023-02-16 DIAGNOSIS — F419 Anxiety disorder, unspecified: Secondary | ICD-10-CM | POA: Diagnosis not present

## 2023-02-16 DIAGNOSIS — E039 Hypothyroidism, unspecified: Secondary | ICD-10-CM | POA: Diagnosis not present

## 2023-02-16 DIAGNOSIS — D649 Anemia, unspecified: Secondary | ICD-10-CM | POA: Diagnosis not present

## 2023-02-16 DIAGNOSIS — C7800 Secondary malignant neoplasm of unspecified lung: Secondary | ICD-10-CM | POA: Diagnosis not present

## 2023-02-17 DIAGNOSIS — M5126 Other intervertebral disc displacement, lumbar region: Secondary | ICD-10-CM | POA: Diagnosis not present

## 2023-02-17 DIAGNOSIS — Z87891 Personal history of nicotine dependence: Secondary | ICD-10-CM | POA: Diagnosis not present

## 2023-02-17 DIAGNOSIS — C7982 Secondary malignant neoplasm of genital organs: Secondary | ICD-10-CM | POA: Diagnosis not present

## 2023-02-17 DIAGNOSIS — C7951 Secondary malignant neoplasm of bone: Secondary | ICD-10-CM | POA: Diagnosis not present

## 2023-02-17 DIAGNOSIS — C7931 Secondary malignant neoplasm of brain: Secondary | ICD-10-CM | POA: Diagnosis not present

## 2023-02-17 DIAGNOSIS — E039 Hypothyroidism, unspecified: Secondary | ICD-10-CM | POA: Diagnosis not present

## 2023-02-17 DIAGNOSIS — C539 Malignant neoplasm of cervix uteri, unspecified: Secondary | ICD-10-CM | POA: Diagnosis not present

## 2023-02-17 DIAGNOSIS — E441 Mild protein-calorie malnutrition: Secondary | ICD-10-CM | POA: Diagnosis not present

## 2023-02-17 DIAGNOSIS — C7800 Secondary malignant neoplasm of unspecified lung: Secondary | ICD-10-CM | POA: Diagnosis not present

## 2023-02-17 DIAGNOSIS — F419 Anxiety disorder, unspecified: Secondary | ICD-10-CM | POA: Diagnosis not present

## 2023-02-17 DIAGNOSIS — D649 Anemia, unspecified: Secondary | ICD-10-CM | POA: Diagnosis not present

## 2023-02-18 DIAGNOSIS — Z87891 Personal history of nicotine dependence: Secondary | ICD-10-CM | POA: Diagnosis not present

## 2023-02-18 DIAGNOSIS — E441 Mild protein-calorie malnutrition: Secondary | ICD-10-CM | POA: Diagnosis not present

## 2023-02-18 DIAGNOSIS — C7931 Secondary malignant neoplasm of brain: Secondary | ICD-10-CM | POA: Diagnosis not present

## 2023-02-18 DIAGNOSIS — D649 Anemia, unspecified: Secondary | ICD-10-CM | POA: Diagnosis not present

## 2023-02-18 DIAGNOSIS — C7951 Secondary malignant neoplasm of bone: Secondary | ICD-10-CM | POA: Diagnosis not present

## 2023-02-18 DIAGNOSIS — C7982 Secondary malignant neoplasm of genital organs: Secondary | ICD-10-CM | POA: Diagnosis not present

## 2023-02-18 DIAGNOSIS — C7800 Secondary malignant neoplasm of unspecified lung: Secondary | ICD-10-CM | POA: Diagnosis not present

## 2023-02-18 DIAGNOSIS — F419 Anxiety disorder, unspecified: Secondary | ICD-10-CM | POA: Diagnosis not present

## 2023-02-18 DIAGNOSIS — E039 Hypothyroidism, unspecified: Secondary | ICD-10-CM | POA: Diagnosis not present

## 2023-02-18 DIAGNOSIS — C539 Malignant neoplasm of cervix uteri, unspecified: Secondary | ICD-10-CM | POA: Diagnosis not present

## 2023-02-18 DIAGNOSIS — M5126 Other intervertebral disc displacement, lumbar region: Secondary | ICD-10-CM | POA: Diagnosis not present

## 2023-02-19 DIAGNOSIS — F419 Anxiety disorder, unspecified: Secondary | ICD-10-CM | POA: Diagnosis not present

## 2023-02-19 DIAGNOSIS — C7931 Secondary malignant neoplasm of brain: Secondary | ICD-10-CM | POA: Diagnosis not present

## 2023-02-19 DIAGNOSIS — C7800 Secondary malignant neoplasm of unspecified lung: Secondary | ICD-10-CM | POA: Diagnosis not present

## 2023-02-19 DIAGNOSIS — D649 Anemia, unspecified: Secondary | ICD-10-CM | POA: Diagnosis not present

## 2023-02-19 DIAGNOSIS — C7982 Secondary malignant neoplasm of genital organs: Secondary | ICD-10-CM | POA: Diagnosis not present

## 2023-02-19 DIAGNOSIS — C7951 Secondary malignant neoplasm of bone: Secondary | ICD-10-CM | POA: Diagnosis not present

## 2023-02-19 DIAGNOSIS — C539 Malignant neoplasm of cervix uteri, unspecified: Secondary | ICD-10-CM | POA: Diagnosis not present

## 2023-02-19 DIAGNOSIS — Z87891 Personal history of nicotine dependence: Secondary | ICD-10-CM | POA: Diagnosis not present

## 2023-02-19 DIAGNOSIS — E441 Mild protein-calorie malnutrition: Secondary | ICD-10-CM | POA: Diagnosis not present

## 2023-02-19 DIAGNOSIS — E039 Hypothyroidism, unspecified: Secondary | ICD-10-CM | POA: Diagnosis not present

## 2023-02-19 DIAGNOSIS — M5126 Other intervertebral disc displacement, lumbar region: Secondary | ICD-10-CM | POA: Diagnosis not present

## 2023-02-20 DIAGNOSIS — C539 Malignant neoplasm of cervix uteri, unspecified: Secondary | ICD-10-CM | POA: Diagnosis not present

## 2023-02-20 DIAGNOSIS — D649 Anemia, unspecified: Secondary | ICD-10-CM | POA: Diagnosis not present

## 2023-02-20 DIAGNOSIS — C7800 Secondary malignant neoplasm of unspecified lung: Secondary | ICD-10-CM | POA: Diagnosis not present

## 2023-02-20 DIAGNOSIS — C7931 Secondary malignant neoplasm of brain: Secondary | ICD-10-CM | POA: Diagnosis not present

## 2023-02-20 DIAGNOSIS — F419 Anxiety disorder, unspecified: Secondary | ICD-10-CM | POA: Diagnosis not present

## 2023-02-20 DIAGNOSIS — E039 Hypothyroidism, unspecified: Secondary | ICD-10-CM | POA: Diagnosis not present

## 2023-02-20 DIAGNOSIS — M5126 Other intervertebral disc displacement, lumbar region: Secondary | ICD-10-CM | POA: Diagnosis not present

## 2023-02-20 DIAGNOSIS — E441 Mild protein-calorie malnutrition: Secondary | ICD-10-CM | POA: Diagnosis not present

## 2023-02-20 DIAGNOSIS — Z87891 Personal history of nicotine dependence: Secondary | ICD-10-CM | POA: Diagnosis not present

## 2023-02-20 DIAGNOSIS — C7951 Secondary malignant neoplasm of bone: Secondary | ICD-10-CM | POA: Diagnosis not present

## 2023-02-20 DIAGNOSIS — C7982 Secondary malignant neoplasm of genital organs: Secondary | ICD-10-CM | POA: Diagnosis not present

## 2023-02-21 DIAGNOSIS — E039 Hypothyroidism, unspecified: Secondary | ICD-10-CM | POA: Diagnosis not present

## 2023-02-21 DIAGNOSIS — E441 Mild protein-calorie malnutrition: Secondary | ICD-10-CM | POA: Diagnosis not present

## 2023-02-21 DIAGNOSIS — C7931 Secondary malignant neoplasm of brain: Secondary | ICD-10-CM | POA: Diagnosis not present

## 2023-02-21 DIAGNOSIS — C7951 Secondary malignant neoplasm of bone: Secondary | ICD-10-CM | POA: Diagnosis not present

## 2023-02-21 DIAGNOSIS — F419 Anxiety disorder, unspecified: Secondary | ICD-10-CM | POA: Diagnosis not present

## 2023-02-21 DIAGNOSIS — C7982 Secondary malignant neoplasm of genital organs: Secondary | ICD-10-CM | POA: Diagnosis not present

## 2023-02-21 DIAGNOSIS — C539 Malignant neoplasm of cervix uteri, unspecified: Secondary | ICD-10-CM | POA: Diagnosis not present

## 2023-02-21 DIAGNOSIS — Z87891 Personal history of nicotine dependence: Secondary | ICD-10-CM | POA: Diagnosis not present

## 2023-02-21 DIAGNOSIS — C7800 Secondary malignant neoplasm of unspecified lung: Secondary | ICD-10-CM | POA: Diagnosis not present

## 2023-02-21 DIAGNOSIS — D649 Anemia, unspecified: Secondary | ICD-10-CM | POA: Diagnosis not present

## 2023-02-21 DIAGNOSIS — M5126 Other intervertebral disc displacement, lumbar region: Secondary | ICD-10-CM | POA: Diagnosis not present

## 2023-02-22 DIAGNOSIS — F419 Anxiety disorder, unspecified: Secondary | ICD-10-CM | POA: Diagnosis not present

## 2023-02-22 DIAGNOSIS — E039 Hypothyroidism, unspecified: Secondary | ICD-10-CM | POA: Diagnosis not present

## 2023-02-22 DIAGNOSIS — Z87891 Personal history of nicotine dependence: Secondary | ICD-10-CM | POA: Diagnosis not present

## 2023-02-22 DIAGNOSIS — C7931 Secondary malignant neoplasm of brain: Secondary | ICD-10-CM | POA: Diagnosis not present

## 2023-02-22 DIAGNOSIS — D649 Anemia, unspecified: Secondary | ICD-10-CM | POA: Diagnosis not present

## 2023-02-22 DIAGNOSIS — M5126 Other intervertebral disc displacement, lumbar region: Secondary | ICD-10-CM | POA: Diagnosis not present

## 2023-02-22 DIAGNOSIS — C7982 Secondary malignant neoplasm of genital organs: Secondary | ICD-10-CM | POA: Diagnosis not present

## 2023-02-22 DIAGNOSIS — C7800 Secondary malignant neoplasm of unspecified lung: Secondary | ICD-10-CM | POA: Diagnosis not present

## 2023-02-22 DIAGNOSIS — E441 Mild protein-calorie malnutrition: Secondary | ICD-10-CM | POA: Diagnosis not present

## 2023-02-22 DIAGNOSIS — C7951 Secondary malignant neoplasm of bone: Secondary | ICD-10-CM | POA: Diagnosis not present

## 2023-02-22 DIAGNOSIS — C539 Malignant neoplasm of cervix uteri, unspecified: Secondary | ICD-10-CM | POA: Diagnosis not present

## 2023-02-23 DIAGNOSIS — Z87891 Personal history of nicotine dependence: Secondary | ICD-10-CM | POA: Diagnosis not present

## 2023-02-23 DIAGNOSIS — E039 Hypothyroidism, unspecified: Secondary | ICD-10-CM | POA: Diagnosis not present

## 2023-02-23 DIAGNOSIS — E441 Mild protein-calorie malnutrition: Secondary | ICD-10-CM | POA: Diagnosis not present

## 2023-02-23 DIAGNOSIS — C7931 Secondary malignant neoplasm of brain: Secondary | ICD-10-CM | POA: Diagnosis not present

## 2023-02-23 DIAGNOSIS — D649 Anemia, unspecified: Secondary | ICD-10-CM | POA: Diagnosis not present

## 2023-02-23 DIAGNOSIS — F419 Anxiety disorder, unspecified: Secondary | ICD-10-CM | POA: Diagnosis not present

## 2023-02-23 DIAGNOSIS — C7982 Secondary malignant neoplasm of genital organs: Secondary | ICD-10-CM | POA: Diagnosis not present

## 2023-02-23 DIAGNOSIS — C7951 Secondary malignant neoplasm of bone: Secondary | ICD-10-CM | POA: Diagnosis not present

## 2023-02-23 DIAGNOSIS — C539 Malignant neoplasm of cervix uteri, unspecified: Secondary | ICD-10-CM | POA: Diagnosis not present

## 2023-02-23 DIAGNOSIS — M5126 Other intervertebral disc displacement, lumbar region: Secondary | ICD-10-CM | POA: Diagnosis not present

## 2023-02-23 DIAGNOSIS — C7800 Secondary malignant neoplasm of unspecified lung: Secondary | ICD-10-CM | POA: Diagnosis not present

## 2023-02-24 DIAGNOSIS — C7982 Secondary malignant neoplasm of genital organs: Secondary | ICD-10-CM | POA: Diagnosis not present

## 2023-02-24 DIAGNOSIS — E441 Mild protein-calorie malnutrition: Secondary | ICD-10-CM | POA: Diagnosis not present

## 2023-02-24 DIAGNOSIS — C539 Malignant neoplasm of cervix uteri, unspecified: Secondary | ICD-10-CM | POA: Diagnosis not present

## 2023-02-24 DIAGNOSIS — E039 Hypothyroidism, unspecified: Secondary | ICD-10-CM | POA: Diagnosis not present

## 2023-02-24 DIAGNOSIS — D649 Anemia, unspecified: Secondary | ICD-10-CM | POA: Diagnosis not present

## 2023-02-24 DIAGNOSIS — M5126 Other intervertebral disc displacement, lumbar region: Secondary | ICD-10-CM | POA: Diagnosis not present

## 2023-02-24 DIAGNOSIS — C7931 Secondary malignant neoplasm of brain: Secondary | ICD-10-CM | POA: Diagnosis not present

## 2023-02-24 DIAGNOSIS — C7951 Secondary malignant neoplasm of bone: Secondary | ICD-10-CM | POA: Diagnosis not present

## 2023-02-24 DIAGNOSIS — F419 Anxiety disorder, unspecified: Secondary | ICD-10-CM | POA: Diagnosis not present

## 2023-02-24 DIAGNOSIS — C7800 Secondary malignant neoplasm of unspecified lung: Secondary | ICD-10-CM | POA: Diagnosis not present

## 2023-02-24 DIAGNOSIS — Z87891 Personal history of nicotine dependence: Secondary | ICD-10-CM | POA: Diagnosis not present

## 2023-02-25 DIAGNOSIS — F419 Anxiety disorder, unspecified: Secondary | ICD-10-CM | POA: Diagnosis not present

## 2023-02-25 DIAGNOSIS — E441 Mild protein-calorie malnutrition: Secondary | ICD-10-CM | POA: Diagnosis not present

## 2023-02-25 DIAGNOSIS — D649 Anemia, unspecified: Secondary | ICD-10-CM | POA: Diagnosis not present

## 2023-02-25 DIAGNOSIS — C7800 Secondary malignant neoplasm of unspecified lung: Secondary | ICD-10-CM | POA: Diagnosis not present

## 2023-02-25 DIAGNOSIS — C539 Malignant neoplasm of cervix uteri, unspecified: Secondary | ICD-10-CM | POA: Diagnosis not present

## 2023-02-25 DIAGNOSIS — C7982 Secondary malignant neoplasm of genital organs: Secondary | ICD-10-CM | POA: Diagnosis not present

## 2023-02-25 DIAGNOSIS — Z87891 Personal history of nicotine dependence: Secondary | ICD-10-CM | POA: Diagnosis not present

## 2023-02-25 DIAGNOSIS — C7951 Secondary malignant neoplasm of bone: Secondary | ICD-10-CM | POA: Diagnosis not present

## 2023-02-25 DIAGNOSIS — C7931 Secondary malignant neoplasm of brain: Secondary | ICD-10-CM | POA: Diagnosis not present

## 2023-02-25 DIAGNOSIS — M5126 Other intervertebral disc displacement, lumbar region: Secondary | ICD-10-CM | POA: Diagnosis not present

## 2023-02-25 DIAGNOSIS — E039 Hypothyroidism, unspecified: Secondary | ICD-10-CM | POA: Diagnosis not present

## 2023-02-26 DIAGNOSIS — C7800 Secondary malignant neoplasm of unspecified lung: Secondary | ICD-10-CM | POA: Diagnosis not present

## 2023-02-26 DIAGNOSIS — C7982 Secondary malignant neoplasm of genital organs: Secondary | ICD-10-CM | POA: Diagnosis not present

## 2023-02-26 DIAGNOSIS — C7951 Secondary malignant neoplasm of bone: Secondary | ICD-10-CM | POA: Diagnosis not present

## 2023-02-26 DIAGNOSIS — M5126 Other intervertebral disc displacement, lumbar region: Secondary | ICD-10-CM | POA: Diagnosis not present

## 2023-02-26 DIAGNOSIS — Z87891 Personal history of nicotine dependence: Secondary | ICD-10-CM | POA: Diagnosis not present

## 2023-02-26 DIAGNOSIS — C539 Malignant neoplasm of cervix uteri, unspecified: Secondary | ICD-10-CM | POA: Diagnosis not present

## 2023-02-26 DIAGNOSIS — C7931 Secondary malignant neoplasm of brain: Secondary | ICD-10-CM | POA: Diagnosis not present

## 2023-02-26 DIAGNOSIS — D649 Anemia, unspecified: Secondary | ICD-10-CM | POA: Diagnosis not present

## 2023-02-26 DIAGNOSIS — E039 Hypothyroidism, unspecified: Secondary | ICD-10-CM | POA: Diagnosis not present

## 2023-02-26 DIAGNOSIS — F419 Anxiety disorder, unspecified: Secondary | ICD-10-CM | POA: Diagnosis not present

## 2023-02-26 DIAGNOSIS — E441 Mild protein-calorie malnutrition: Secondary | ICD-10-CM | POA: Diagnosis not present

## 2023-02-27 DIAGNOSIS — M5126 Other intervertebral disc displacement, lumbar region: Secondary | ICD-10-CM | POA: Diagnosis not present

## 2023-02-27 DIAGNOSIS — F419 Anxiety disorder, unspecified: Secondary | ICD-10-CM | POA: Diagnosis not present

## 2023-02-27 DIAGNOSIS — C7982 Secondary malignant neoplasm of genital organs: Secondary | ICD-10-CM | POA: Diagnosis not present

## 2023-02-27 DIAGNOSIS — C7800 Secondary malignant neoplasm of unspecified lung: Secondary | ICD-10-CM | POA: Diagnosis not present

## 2023-02-27 DIAGNOSIS — D649 Anemia, unspecified: Secondary | ICD-10-CM | POA: Diagnosis not present

## 2023-02-27 DIAGNOSIS — C539 Malignant neoplasm of cervix uteri, unspecified: Secondary | ICD-10-CM | POA: Diagnosis not present

## 2023-02-27 DIAGNOSIS — E441 Mild protein-calorie malnutrition: Secondary | ICD-10-CM | POA: Diagnosis not present

## 2023-02-27 DIAGNOSIS — C7931 Secondary malignant neoplasm of brain: Secondary | ICD-10-CM | POA: Diagnosis not present

## 2023-02-27 DIAGNOSIS — C7951 Secondary malignant neoplasm of bone: Secondary | ICD-10-CM | POA: Diagnosis not present

## 2023-02-27 DIAGNOSIS — Z87891 Personal history of nicotine dependence: Secondary | ICD-10-CM | POA: Diagnosis not present

## 2023-02-27 DIAGNOSIS — E039 Hypothyroidism, unspecified: Secondary | ICD-10-CM | POA: Diagnosis not present

## 2023-02-28 DIAGNOSIS — C7931 Secondary malignant neoplasm of brain: Secondary | ICD-10-CM | POA: Diagnosis not present

## 2023-02-28 DIAGNOSIS — F419 Anxiety disorder, unspecified: Secondary | ICD-10-CM | POA: Diagnosis not present

## 2023-02-28 DIAGNOSIS — Z87891 Personal history of nicotine dependence: Secondary | ICD-10-CM | POA: Diagnosis not present

## 2023-02-28 DIAGNOSIS — C7800 Secondary malignant neoplasm of unspecified lung: Secondary | ICD-10-CM | POA: Diagnosis not present

## 2023-02-28 DIAGNOSIS — C7982 Secondary malignant neoplasm of genital organs: Secondary | ICD-10-CM | POA: Diagnosis not present

## 2023-02-28 DIAGNOSIS — D649 Anemia, unspecified: Secondary | ICD-10-CM | POA: Diagnosis not present

## 2023-02-28 DIAGNOSIS — E441 Mild protein-calorie malnutrition: Secondary | ICD-10-CM | POA: Diagnosis not present

## 2023-02-28 DIAGNOSIS — M5126 Other intervertebral disc displacement, lumbar region: Secondary | ICD-10-CM | POA: Diagnosis not present

## 2023-02-28 DIAGNOSIS — C7951 Secondary malignant neoplasm of bone: Secondary | ICD-10-CM | POA: Diagnosis not present

## 2023-02-28 DIAGNOSIS — C539 Malignant neoplasm of cervix uteri, unspecified: Secondary | ICD-10-CM | POA: Diagnosis not present

## 2023-02-28 DIAGNOSIS — E039 Hypothyroidism, unspecified: Secondary | ICD-10-CM | POA: Diagnosis not present

## 2023-03-01 DIAGNOSIS — E441 Mild protein-calorie malnutrition: Secondary | ICD-10-CM | POA: Diagnosis not present

## 2023-03-01 DIAGNOSIS — C7800 Secondary malignant neoplasm of unspecified lung: Secondary | ICD-10-CM | POA: Diagnosis not present

## 2023-03-01 DIAGNOSIS — C7951 Secondary malignant neoplasm of bone: Secondary | ICD-10-CM | POA: Diagnosis not present

## 2023-03-01 DIAGNOSIS — M5126 Other intervertebral disc displacement, lumbar region: Secondary | ICD-10-CM | POA: Diagnosis not present

## 2023-03-01 DIAGNOSIS — C539 Malignant neoplasm of cervix uteri, unspecified: Secondary | ICD-10-CM | POA: Diagnosis not present

## 2023-03-01 DIAGNOSIS — F419 Anxiety disorder, unspecified: Secondary | ICD-10-CM | POA: Diagnosis not present

## 2023-03-01 DIAGNOSIS — E039 Hypothyroidism, unspecified: Secondary | ICD-10-CM | POA: Diagnosis not present

## 2023-03-01 DIAGNOSIS — Z87891 Personal history of nicotine dependence: Secondary | ICD-10-CM | POA: Diagnosis not present

## 2023-03-01 DIAGNOSIS — C7982 Secondary malignant neoplasm of genital organs: Secondary | ICD-10-CM | POA: Diagnosis not present

## 2023-03-01 DIAGNOSIS — C7931 Secondary malignant neoplasm of brain: Secondary | ICD-10-CM | POA: Diagnosis not present

## 2023-03-01 DIAGNOSIS — D649 Anemia, unspecified: Secondary | ICD-10-CM | POA: Diagnosis not present

## 2023-03-02 DIAGNOSIS — E441 Mild protein-calorie malnutrition: Secondary | ICD-10-CM | POA: Diagnosis not present

## 2023-03-02 DIAGNOSIS — E039 Hypothyroidism, unspecified: Secondary | ICD-10-CM | POA: Diagnosis not present

## 2023-03-02 DIAGNOSIS — F419 Anxiety disorder, unspecified: Secondary | ICD-10-CM | POA: Diagnosis not present

## 2023-03-02 DIAGNOSIS — Z87891 Personal history of nicotine dependence: Secondary | ICD-10-CM | POA: Diagnosis not present

## 2023-03-02 DIAGNOSIS — C7951 Secondary malignant neoplasm of bone: Secondary | ICD-10-CM | POA: Diagnosis not present

## 2023-03-02 DIAGNOSIS — D649 Anemia, unspecified: Secondary | ICD-10-CM | POA: Diagnosis not present

## 2023-03-02 DIAGNOSIS — C539 Malignant neoplasm of cervix uteri, unspecified: Secondary | ICD-10-CM | POA: Diagnosis not present

## 2023-03-02 DIAGNOSIS — C7982 Secondary malignant neoplasm of genital organs: Secondary | ICD-10-CM | POA: Diagnosis not present

## 2023-03-02 DIAGNOSIS — C7800 Secondary malignant neoplasm of unspecified lung: Secondary | ICD-10-CM | POA: Diagnosis not present

## 2023-03-02 DIAGNOSIS — C7931 Secondary malignant neoplasm of brain: Secondary | ICD-10-CM | POA: Diagnosis not present

## 2023-03-02 DIAGNOSIS — M5126 Other intervertebral disc displacement, lumbar region: Secondary | ICD-10-CM | POA: Diagnosis not present

## 2023-03-03 DIAGNOSIS — C539 Malignant neoplasm of cervix uteri, unspecified: Secondary | ICD-10-CM | POA: Diagnosis not present

## 2023-03-03 DIAGNOSIS — F419 Anxiety disorder, unspecified: Secondary | ICD-10-CM | POA: Diagnosis not present

## 2023-03-03 DIAGNOSIS — C7951 Secondary malignant neoplasm of bone: Secondary | ICD-10-CM | POA: Diagnosis not present

## 2023-03-03 DIAGNOSIS — C7931 Secondary malignant neoplasm of brain: Secondary | ICD-10-CM | POA: Diagnosis not present

## 2023-03-03 DIAGNOSIS — D649 Anemia, unspecified: Secondary | ICD-10-CM | POA: Diagnosis not present

## 2023-03-03 DIAGNOSIS — C7982 Secondary malignant neoplasm of genital organs: Secondary | ICD-10-CM | POA: Diagnosis not present

## 2023-03-03 DIAGNOSIS — E441 Mild protein-calorie malnutrition: Secondary | ICD-10-CM | POA: Diagnosis not present

## 2023-03-03 DIAGNOSIS — Z87891 Personal history of nicotine dependence: Secondary | ICD-10-CM | POA: Diagnosis not present

## 2023-03-03 DIAGNOSIS — E039 Hypothyroidism, unspecified: Secondary | ICD-10-CM | POA: Diagnosis not present

## 2023-03-03 DIAGNOSIS — C7800 Secondary malignant neoplasm of unspecified lung: Secondary | ICD-10-CM | POA: Diagnosis not present

## 2023-03-03 DIAGNOSIS — M5126 Other intervertebral disc displacement, lumbar region: Secondary | ICD-10-CM | POA: Diagnosis not present

## 2023-03-04 DIAGNOSIS — C7931 Secondary malignant neoplasm of brain: Secondary | ICD-10-CM | POA: Diagnosis not present

## 2023-03-04 DIAGNOSIS — C7800 Secondary malignant neoplasm of unspecified lung: Secondary | ICD-10-CM | POA: Diagnosis not present

## 2023-03-04 DIAGNOSIS — C7982 Secondary malignant neoplasm of genital organs: Secondary | ICD-10-CM | POA: Diagnosis not present

## 2023-03-04 DIAGNOSIS — Z87891 Personal history of nicotine dependence: Secondary | ICD-10-CM | POA: Diagnosis not present

## 2023-03-04 DIAGNOSIS — D649 Anemia, unspecified: Secondary | ICD-10-CM | POA: Diagnosis not present

## 2023-03-04 DIAGNOSIS — C7951 Secondary malignant neoplasm of bone: Secondary | ICD-10-CM | POA: Diagnosis not present

## 2023-03-04 DIAGNOSIS — E441 Mild protein-calorie malnutrition: Secondary | ICD-10-CM | POA: Diagnosis not present

## 2023-03-04 DIAGNOSIS — E039 Hypothyroidism, unspecified: Secondary | ICD-10-CM | POA: Diagnosis not present

## 2023-03-04 DIAGNOSIS — C539 Malignant neoplasm of cervix uteri, unspecified: Secondary | ICD-10-CM | POA: Diagnosis not present

## 2023-03-04 DIAGNOSIS — M5126 Other intervertebral disc displacement, lumbar region: Secondary | ICD-10-CM | POA: Diagnosis not present

## 2023-03-04 DIAGNOSIS — F419 Anxiety disorder, unspecified: Secondary | ICD-10-CM | POA: Diagnosis not present

## 2023-03-05 DIAGNOSIS — E441 Mild protein-calorie malnutrition: Secondary | ICD-10-CM | POA: Diagnosis not present

## 2023-03-05 DIAGNOSIS — C7800 Secondary malignant neoplasm of unspecified lung: Secondary | ICD-10-CM | POA: Diagnosis not present

## 2023-03-05 DIAGNOSIS — C7931 Secondary malignant neoplasm of brain: Secondary | ICD-10-CM | POA: Diagnosis not present

## 2023-03-05 DIAGNOSIS — M5126 Other intervertebral disc displacement, lumbar region: Secondary | ICD-10-CM | POA: Diagnosis not present

## 2023-03-05 DIAGNOSIS — C7951 Secondary malignant neoplasm of bone: Secondary | ICD-10-CM | POA: Diagnosis not present

## 2023-03-05 DIAGNOSIS — C539 Malignant neoplasm of cervix uteri, unspecified: Secondary | ICD-10-CM | POA: Diagnosis not present

## 2023-03-05 DIAGNOSIS — C7982 Secondary malignant neoplasm of genital organs: Secondary | ICD-10-CM | POA: Diagnosis not present

## 2023-03-05 DIAGNOSIS — D649 Anemia, unspecified: Secondary | ICD-10-CM | POA: Diagnosis not present

## 2023-03-05 DIAGNOSIS — F419 Anxiety disorder, unspecified: Secondary | ICD-10-CM | POA: Diagnosis not present

## 2023-03-05 DIAGNOSIS — E039 Hypothyroidism, unspecified: Secondary | ICD-10-CM | POA: Diagnosis not present

## 2023-03-05 DIAGNOSIS — Z87891 Personal history of nicotine dependence: Secondary | ICD-10-CM | POA: Diagnosis not present

## 2023-03-12 DEATH — deceased

## 2023-09-28 NOTE — Telephone Encounter (Signed)
TC
